# Patient Record
Sex: Female | Born: 1940 | Race: White | Hispanic: No | Smoking: Former smoker
Health system: Southern US, Community
[De-identification: ages and names within clinical notes are randomized; demographics above are authoritative.]

## PROBLEM LIST (undated history)

## (undated) DIAGNOSIS — J449 Chronic obstructive pulmonary disease, unspecified: Secondary | ICD-10-CM

## (undated) DIAGNOSIS — I10 Essential (primary) hypertension: Secondary | ICD-10-CM

## (undated) DIAGNOSIS — J45909 Unspecified asthma, uncomplicated: Secondary | ICD-10-CM

## (undated) HISTORY — PX: COLON SURGERY: SHX602

## (undated) HISTORY — PX: ABDOMINAL HYSTERECTOMY: SHX81

---

## 2017-06-13 ENCOUNTER — Emergency Department: Payer: Medicare HMO

## 2017-06-13 ENCOUNTER — Inpatient Hospital Stay
Admission: EM | Admit: 2017-06-13 | Discharge: 2017-06-17 | DRG: 190 | Disposition: A | Discharge status: Home Health | Payer: Medicare HMO | Attending: Internal Medicine | Admitting: Internal Medicine

## 2017-06-13 ENCOUNTER — Encounter: Payer: Self-pay | Admitting: Emergency Medicine

## 2017-06-13 ENCOUNTER — Other Ambulatory Visit: Payer: Self-pay

## 2017-06-13 DIAGNOSIS — E871 Hypo-osmolality and hyponatremia: Secondary | ICD-10-CM | POA: Diagnosis present

## 2017-06-13 DIAGNOSIS — F17210 Nicotine dependence, cigarettes, uncomplicated: Secondary | ICD-10-CM | POA: Diagnosis present

## 2017-06-13 DIAGNOSIS — J441 Chronic obstructive pulmonary disease with (acute) exacerbation: Secondary | ICD-10-CM | POA: Diagnosis present

## 2017-06-13 DIAGNOSIS — Z885 Allergy status to narcotic agent status: Secondary | ICD-10-CM | POA: Diagnosis not present

## 2017-06-13 DIAGNOSIS — Z7951 Long term (current) use of inhaled steroids: Secondary | ICD-10-CM

## 2017-06-13 DIAGNOSIS — R0902 Hypoxemia: Secondary | ICD-10-CM

## 2017-06-13 DIAGNOSIS — I10 Essential (primary) hypertension: Secondary | ICD-10-CM | POA: Diagnosis present

## 2017-06-13 DIAGNOSIS — Z886 Allergy status to analgesic agent status: Secondary | ICD-10-CM

## 2017-06-13 DIAGNOSIS — Z90710 Acquired absence of both cervix and uterus: Secondary | ICD-10-CM

## 2017-06-13 DIAGNOSIS — Z9981 Dependence on supplemental oxygen: Secondary | ICD-10-CM

## 2017-06-13 DIAGNOSIS — F419 Anxiety disorder, unspecified: Secondary | ICD-10-CM | POA: Diagnosis present

## 2017-06-13 DIAGNOSIS — Z79899 Other long term (current) drug therapy: Secondary | ICD-10-CM

## 2017-06-13 DIAGNOSIS — J9621 Acute and chronic respiratory failure with hypoxia: Secondary | ICD-10-CM | POA: Diagnosis present

## 2017-06-13 DIAGNOSIS — R0602 Shortness of breath: Secondary | ICD-10-CM | POA: Diagnosis present

## 2017-06-13 HISTORY — DX: Essential (primary) hypertension: I10

## 2017-06-13 HISTORY — DX: Unspecified asthma, uncomplicated: J45.909

## 2017-06-13 HISTORY — DX: Chronic obstructive pulmonary disease, unspecified: J44.9

## 2017-06-13 LAB — MAGNESIUM: Magnesium: 1.9 mg/dL (ref 1.7–2.4)

## 2017-06-13 LAB — CBC WITH DIFFERENTIAL/PLATELET
BASOS ABS: 0.1 10*3/uL (ref 0–0.1)
BASOS PCT: 1 %
EOS ABS: 0.2 10*3/uL (ref 0–0.7)
EOS PCT: 3 %
HCT: 46 % (ref 35.0–47.0)
Hemoglobin: 15.3 g/dL (ref 12.0–16.0)
LYMPHS PCT: 23 %
Lymphs Abs: 1.7 10*3/uL (ref 1.0–3.6)
MCH: 31.3 pg (ref 26.0–34.0)
MCHC: 33.3 g/dL (ref 32.0–36.0)
MCV: 94 fL (ref 80.0–100.0)
Monocytes Absolute: 0.8 10*3/uL (ref 0.2–0.9)
Monocytes Relative: 10 %
Neutro Abs: 4.7 10*3/uL (ref 1.4–6.5)
Neutrophils Relative %: 63 %
PLATELETS: 319 10*3/uL (ref 150–440)
RBC: 4.89 MIL/uL (ref 3.80–5.20)
RDW: 13.1 % (ref 11.5–14.5)
WBC: 7.5 10*3/uL (ref 3.6–11.0)

## 2017-06-13 LAB — BRAIN NATRIURETIC PEPTIDE: B NATRIURETIC PEPTIDE 5: 13 pg/mL (ref 0.0–100.0)

## 2017-06-13 LAB — TROPONIN I: Troponin I: <0.03 ng/mL (ref <0.03)

## 2017-06-13 LAB — BASIC METABOLIC PANEL
ANION GAP: 13 (ref 5–15)
BUN: 21 mg/dL — ABNORMAL HIGH (ref 6–20)
CALCIUM: 9.4 mg/dL (ref 8.9–10.3)
CO2: 37 mmol/L — ABNORMAL HIGH (ref 22–32)
Chloride: 84 mmol/L — ABNORMAL LOW (ref 101–111)
Creatinine, Ser: 0.8 mg/dL (ref 0.44–1.00)
GFR calc Af Amer: >60 mL/min (ref >60)
GLUCOSE: 104 mg/dL — AB (ref 65–99)
Potassium: 4.1 mmol/L (ref 3.5–5.1)
SODIUM: 134 mmol/L — AB (ref 135–145)

## 2017-06-13 MED ORDER — ENALAPRIL MALEATE 10 MG PO TABS: 10.0000 mg | ORAL_TABLET | Freq: Every day | ORAL | Status: DC

## 2017-06-13 MED ORDER — BISACODYL 5 MG PO TBEC: 5.0000 mg | DELAYED_RELEASE_TABLET | Freq: Every day | ORAL | Status: DC | PRN

## 2017-06-13 MED ORDER — ACETAMINOPHEN 325 MG PO TABS
650.0000 mg | ORAL_TABLET | Freq: Four times a day (QID) | ORAL | Status: DC | PRN
  Administered 2017-06-13 – 2017-06-17 (×9): 650 mg via ORAL
  Filled 2017-06-13 (×9): qty 2

## 2017-06-13 MED ORDER — HYDROCHLOROTHIAZIDE 25 MG PO TABS: 25.0000 mg | ORAL_TABLET | Freq: Every day | ORAL | Status: DC

## 2017-06-13 MED ORDER — ONDANSETRON HCL 4 MG/2ML IJ SOLN: 4.0000 mg | Freq: Four times a day (QID) | INTRAMUSCULAR | Status: DC | PRN

## 2017-06-13 MED ORDER — IPRATROPIUM-ALBUTEROL 0.5-2.5 (3) MG/3ML IN SOLN
3.0000 mL | Freq: Once | RESPIRATORY_TRACT | Status: AC
  Administered 2017-06-13: 3 mL via RESPIRATORY_TRACT
  Filled 2017-06-13: qty 3

## 2017-06-13 MED ORDER — ONDANSETRON HCL 4 MG PO TABS
4.0000 mg | ORAL_TABLET | Freq: Four times a day (QID) | ORAL | Status: DC | PRN
  Administered 2017-06-16: 4 mg via ORAL
  Filled 2017-06-13: qty 1

## 2017-06-13 MED ORDER — ALBUTEROL SULFATE (2.5 MG/3ML) 0.083% IN NEBU: 2.5000 mg | INHALATION_SOLUTION | RESPIRATORY_TRACT | Status: DC | PRN

## 2017-06-13 MED ORDER — LORAZEPAM 2 MG/ML IJ SOLN
0.5000 mg | Freq: Once | INTRAMUSCULAR | Status: AC
  Administered 2017-06-13: 0.5 mg via INTRAVENOUS

## 2017-06-13 MED ORDER — SODIUM CHLORIDE 0.9% FLUSH
3.0000 mL | INTRAVENOUS | Status: DC | PRN
Start: 2017-06-13 — End: 2017-06-17

## 2017-06-13 MED ORDER — HYDROCHLOROTHIAZIDE 25 MG PO TABS
25.0000 mg | ORAL_TABLET | Freq: Every day | ORAL | Status: DC
  Administered 2017-06-14 – 2017-06-17 (×4): 25 mg via ORAL
  Filled 2017-06-13 (×4): qty 1

## 2017-06-13 MED ORDER — ENALAPRIL-HYDROCHLOROTHIAZIDE 10-25 MG PO TABS: 1.0000 | ORAL_TABLET | Freq: Every day | ORAL | Status: DC

## 2017-06-13 MED ORDER — METHYLPREDNISOLONE SODIUM SUCC 125 MG IJ SOLR
125.0000 mg | Freq: Once | INTRAMUSCULAR | Status: AC
  Administered 2017-06-13: 125 mg via INTRAVENOUS
  Filled 2017-06-13: qty 2

## 2017-06-13 MED ORDER — ALBUTEROL SULFATE (2.5 MG/3ML) 0.083% IN NEBU
5.0000 mg | INHALATION_SOLUTION | Freq: Once | RESPIRATORY_TRACT | Status: AC
  Administered 2017-06-13: 5 mg via RESPIRATORY_TRACT
  Filled 2017-06-13: qty 6

## 2017-06-13 MED ORDER — DEXTROSE 5 % IV SOLN
500.0000 mg | Freq: Once | INTRAVENOUS | Status: AC
  Administered 2017-06-13: 500 mg via INTRAVENOUS
  Filled 2017-06-13: qty 500

## 2017-06-13 MED ORDER — ALPRAZOLAM 0.5 MG PO TABS
0.5000 mg | ORAL_TABLET | Freq: Every day | ORAL | Status: DC
  Administered 2017-06-13 – 2017-06-15 (×3): 0.5 mg via ORAL
  Filled 2017-06-13 (×3): qty 1

## 2017-06-13 MED ORDER — ENALAPRIL MALEATE 5 MG PO TABS
5.0000 mg | ORAL_TABLET | Freq: Every day | ORAL | Status: DC
  Administered 2017-06-14 – 2017-06-17 (×4): 5 mg via ORAL
  Filled 2017-06-13 (×4): qty 1

## 2017-06-13 MED ORDER — SENNOSIDES-DOCUSATE SODIUM 8.6-50 MG PO TABS: 1.0000 | ORAL_TABLET | Freq: Every evening | ORAL | Status: DC | PRN

## 2017-06-13 MED ORDER — ENOXAPARIN SODIUM 40 MG/0.4ML ~~LOC~~ SOLN
40.0000 mg | SUBCUTANEOUS | Status: DC
  Administered 2017-06-13 – 2017-06-16 (×4): 40 mg via SUBCUTANEOUS
  Filled 2017-06-13 (×4): qty 0.4

## 2017-06-13 MED ORDER — LORAZEPAM 2 MG/ML IJ SOLN
INTRAMUSCULAR | Status: AC
  Administered 2017-06-13: 0.5 mg via INTRAVENOUS
  Filled 2017-06-13: qty 1

## 2017-06-13 MED ORDER — ORAL CARE MOUTH RINSE
15.0000 mL | Freq: Two times a day (BID) | OROMUCOSAL | Status: DC
  Administered 2017-06-13 – 2017-06-17 (×8): 15 mL via OROMUCOSAL

## 2017-06-13 MED ORDER — IPRATROPIUM-ALBUTEROL 0.5-2.5 (3) MG/3ML IN SOLN
3.0000 mL | Freq: Four times a day (QID) | RESPIRATORY_TRACT | Status: DC
  Administered 2017-06-13 – 2017-06-17 (×12): 3 mL via RESPIRATORY_TRACT
  Filled 2017-06-13 (×12): qty 3

## 2017-06-13 MED ORDER — SODIUM CHLORIDE 0.9 % IV SOLN: 250.0000 mL | INTRAVENOUS | Status: DC | PRN

## 2017-06-13 MED ORDER — GUAIFENESIN 100 MG/5ML PO SOLN
5.0000 mL | ORAL | Status: DC | PRN
  Filled 2017-06-13: qty 5

## 2017-06-13 MED ORDER — METHYLPREDNISOLONE SODIUM SUCC 125 MG IJ SOLR
60.0000 mg | Freq: Four times a day (QID) | INTRAMUSCULAR | Status: DC
Start: 2017-06-13 — End: 2017-06-15
  Administered 2017-06-13 – 2017-06-15 (×7): 60 mg via INTRAVENOUS
  Filled 2017-06-13 (×7): qty 2

## 2017-06-13 MED ORDER — SODIUM CHLORIDE 0.9% FLUSH
3.0000 mL | Freq: Two times a day (BID) | INTRAVENOUS | Status: DC
  Administered 2017-06-13 – 2017-06-16 (×7): 3 mL via INTRAVENOUS

## 2017-06-13 MED ORDER — MOMETASONE FURO-FORMOTEROL FUM 100-5 MCG/ACT IN AERO
2.0000 | INHALATION_SPRAY | Freq: Two times a day (BID) | RESPIRATORY_TRACT | Status: DC
  Administered 2017-06-13 – 2017-06-14 (×2): 2 via RESPIRATORY_TRACT
  Filled 2017-06-13: qty 8.8

## 2017-06-13 NOTE — ED Triage Notes (Signed)
Patient from home via [facility]. Reports increased SOB x 2 days. History of asthma and COPD. EMS reports oxygen saturation of 68% on baseline 2 L Patient given one albuterol and one duoneb treatment by EMS. Oxygen saturation upon arrival to ED 86% on 2L. Oxygen increased to 4L Index. Patient able to speak in complete sentences but does not toleration laying back. Alert and oriented x4.

## 2017-06-13 NOTE — ED Provider Notes (Signed)
[facility] Emergency Department Provider Note  ____________________________________________   First MD Initiated Contact with Patient 06/13/17 1505     (approximate)  I have reviewed the triage vital signs and the nursing notes.   HISTORY  Chief Complaint Shortness of Breath   HPI Candace Butler is a 76 y.o. female with a history of COPD on 2 L of nasal cannula oxygen who is presenting with worsening shortness of breath over the past week.  The patient says that she feels like she has to cough but has not been.  She does not have any breathing treatments at home but does take an inhaled steroid.  She says that she has not seen a pulmonologist in 5 years.  Still smokes but only very infrequently.  Says that she has had a half a cigarette in the past week.  She was placed on 4 L of nasal cannula oxygen which resolved her pressures up to the 80s.  Patient denying any pain at this time.  He denies any fevers.   Past Medical History:  Diagnosis Date  . Asthma   . COPD (chronic obstructive pulmonary disease) (HCC)   . Hypertension     Patient Active Problem List   Diagnosis Date Noted  . COPD exacerbation (HCC) 06/13/2017    Past Surgical History:  Procedure Laterality Date  . ABDOMINAL HYSTERECTOMY    . COLON SURGERY      Prior to Admission medications   Not on File    Allergies Indomethacin and Vicodin [hydrocodone-acetaminophen]  No family history on file.  Social History Social History   Tobacco Use  . Smoking status: Current Every Day Smoker    Types: Cigarettes  . Smokeless tobacco: Never Used  Substance Use Topics  . Alcohol use: No    Frequency: Never  . Drug use: No    Review of Systems  Constitutional: No fever/chills Eyes: No visual changes. ENT: No sore throat. Cardiovascular: Denies chest pain. 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  No nausea, no vomiting.   No diarrhea.  No constipation. Genitourinary: Negative for dysuria. Musculoskeletal: Negative for back pain. Skin: Negative for rash. Neurological: Negative for headaches, focal weakness or numbness.   ____________________________________________   PHYSICAL EXAM:  VITAL SIGNS: ED Triage Vitals  Enc Vitals Group     BP 06/13/17 1437 (!) 150/88     Pulse Rate 06/13/17 1437 (!) 105     Resp 06/13/17 1437 (!) 31     Temp 06/13/17 1437 98.4 F (36.9 C)     Temp Source 06/13/17 1437 Oral     SpO2 06/13/17 1437 (!) 84 %     Weight 06/13/17 1438 166 lb 0.1 oz (75.3 kg)     Height 06/13/17 1438 5\' 1"  (1.549 m)     Head Circumference --      Peak Flow --      Pain Score --      Pain Loc --      Pain Edu? --      Excl. in GC? --     Constitutional: Alert and oriented. Well appearing and in no acute distress. Eyes: Conjunctivae are normal.  Head: Atraumatic. Nose: No congestion/rhinnorhea. Mouth/Throat: Mucous membranes are moist.  Neck: No stridor.   Cardiovascular: Tachycardic, regular rhythm. Grossly normal heart sounds.   Respiratory: Labored respirations with tachypnea.  Speaking in full sentences.  Prolonged expiratory phase with wheezing throughout all fields and decreased air movement.  Gastrointestinal: Soft and nontender. No distention. Musculoskeletal: No lower extremity tenderness nor edema.  No joint effusions. Neurologic:  Normal speech and language. No gross focal neurologic deficits are appreciated. Skin:  Skin is warm, dry and intact. No rash noted. Psychiatric: Mood and affect are normal. Speech and behavior are normal.  ____________________________________________   LABS (all labs ordered are listed, but only abnormal results are displayed)  Labs Reviewed  BASIC METABOLIC PANEL - Abnormal; Notable for the following components:      Result Value   Sodium 134 (*)    Chloride 84 (*)    CO2 37 (*)    Glucose, Bld 104 (*)    BUN 21 (*)    All other  components within normal limits  CBC WITH DIFFERENTIAL/PLATELET  BRAIN NATRIURETIC PEPTIDE  TROPONIN I   ____________________________________________  EKG  ED ECG REPORT I, Arelia LongestSchaevitz,  David M, the attending physician, personally viewed and interpreted this ECG.   Date: 06/13/2017  EKG Time: 1438  Rate: 105  Rhythm: sinus tachycardia  Axis: right  Intervals:none  ST&T Change: No ST segment elevation or depression.  No abnormal T wave inversion.  ____________________________________________  RADIOLOGY  Hyperexpansion without acute findings. ____________________________________________   PROCEDURES  Procedure(s) performed:   Procedures  Critical Care performed:  CRITICAL CARE Performed by: Arelia LongestSchaevitz,  David M   Total critical care time: 35 minutes  Critical care time was exclusive of separately billable procedures and treating other patients.  Critical care was necessary to treat or prevent imminent or life-threatening deterioration.  Critical care was time spent personally by me on the following activities: development of treatment plan with patient and/or surrogate as well as nursing, discussions with consultants, evaluation of patient's response to treatment, examination of patient, obtaining history from patient or surrogate, ordering and performing treatments and interventions, ordering and review of laboratory studies, ordering and review of radiographic studies, pulse oximetry and re-evaluation of patient's condition.  ____________________________________________   INITIAL IMPRESSION / ASSESSMENT AND PLAN / ED COURSE  Pertinent labs & imaging results that were available during my care of the patient were reviewed by me and considered in my medical decision making (see chart for details).  Differential includes, but is not limited to, viral syndrome, bronchitis including COPD exacerbation, pneumonia, reactive airway disease including asthma, CHF including  exacerbation with or without pulmonary/interstitial edema, pneumothorax, ACS, thoracic trauma, and pulmonary embolism.  As part of my medical decision making, I reviewed the following data within the electronic MEDICAL RECORD NUMBERNo previous notes on record for review.  ----------------------------------------- 3:35 PM on 06/13/2017 -----------------------------------------  Patient at this time has had 3 neb treatments as well as steroids.  Still with wheezing and labored respirations.  Will be admitted to the hospital.  Patient and daughter aware of the plan and willing to comply.  Signed out to Dr. Imogene Burnhen.       ____________________________________________   FINAL CLINICAL IMPRESSION(S) / ED DIAGNOSES  COPD exacerbation.    NEW MEDICATIONS STARTED DURING THIS VISIT:  This SmartLink is deprecated. Use AVSMEDLIST instead to display the medication list for a patient.   Note:  This document was prepared using Dragon voice recognition software and may include unintentional dictation errors.     Myrna BlazerSchaevitz, David Matthew, MD 06/13/17 1537

## 2017-06-13 NOTE — H&P (Signed)
[facility] - [place] at [facility]   PATIENT NAME: Candace GibbonGlenda Butler    MR#:  [id number]  DATE OF BIRTH:  1941/06/21  DATE OF ADMISSION:  06/13/2017  PRIMARY CARE PHYSICIAN: Leotis ShamesSingh, Jasmine, MD   REQUESTING/REFERRING PHYSICIAN: Myrna BlazerSchaevitz, David Matthew, MD  CHIEF COMPLAINT:   Chief Complaint  Patient presents with  . Shortness of Breath   Worsening shortness of breath is, wheezing and cough today. HISTORY OF PRESENT ILLNESS:  Candace GibbonGlenda Butler  is a 76 y.o. female with a known history of COPD, chronic respiratory failure on home oxygen 2 L, and hypertension.  The patient presents to the ED with above chief complaints.  She denies any fever or chills, no chest pain but chest tightness, no palpitations no leg swelling.  Chest x-ray did not show any infiltrate.  She is found hypoxia and put on oxygen 4 L in the ED.  She is treated with nebulizer and Zithromax in the ED.  PAST MEDICAL HISTORY:   Past Medical History:  Diagnosis Date  . Asthma   . COPD (chronic obstructive pulmonary disease) (HCC)   . Hypertension     PAST SURGICAL HISTORY:   Past Surgical History:  Procedure Laterality Date  . ABDOMINAL HYSTERECTOMY    . COLON SURGERY      SOCIAL HISTORY:   Social History   Tobacco Use  . Smoking status: Current Every Day Smoker    Types: Cigarettes  . Smokeless tobacco: Never Used  Substance Use Topics  . Alcohol use: No    Frequency: Never    FAMILY HISTORY:  No family history on file.  DRUG ALLERGIES:   Allergies  Allergen Reactions  . Indomethacin   . Vicodin [Hydrocodone-Acetaminophen]     REVIEW OF SYSTEMS:   Review of Systems  Constitutional: Positive for malaise/fatigue. Negative for chills and fever.  HENT: Negative for sore throat.   Eyes: Negative for blurred vision and double vision.  Respiratory: Positive for cough, shortness of breath and wheezing. Negative for hemoptysis, sputum production and stridor.   Cardiovascular:  Negative for chest pain, palpitations, orthopnea and leg swelling.  Gastrointestinal: Negative for abdominal pain, blood in stool, diarrhea, melena, nausea and vomiting.  Genitourinary: Negative for dysuria, flank pain and hematuria.  Musculoskeletal: Negative for back pain and joint pain.  Skin: Negative for rash.  Neurological: Positive for weakness. Negative for dizziness, sensory change, focal weakness, seizures, loss of consciousness and headaches.  Endo/Heme/Allergies: Negative for polydipsia.  Psychiatric/Behavioral: Negative for depression. The patient is not nervous/anxious.     MEDICATIONS AT HOME:   Prior to Admission medications   Medication Sig Start Date End Date Taking? Authorizing Provider  ADVAIR DISKUS 250-50 MCG/DOSE AEPB Take 1 puff 2 (two) times daily by mouth. 05/28/17  Yes [provider]  ALPRAZolam Prudy Feeler(XANAX) 0.5 MG tablet Take 1 tablet daily by mouth. 05/23/17  Yes [provider]  enalapril-hydrochlorothiazide (VASERETIC) 10-25 MG tablet Take 1 tablet daily by mouth. 04/12/17  Yes [provider]      VITAL SIGNS:  Blood pressure (!) 141/63, pulse (!) 108, temperature 98.4 F (36.9 C), temperature source Oral, resp. rate (!) 24, height 5\' 1"  (1.549 m), weight 166 lb 0.1 oz (75.3 kg), SpO2 99 %.  PHYSICAL EXAMINATION:  Physical Exam  GENERAL:  76 y.o.-year-old patient lying in the bed with no acute distress.  EYES: Pupils equal, round, reactive to light and accommodation. No scleral icterus. Extraocular muscles intact.  HEENT: Head atraumatic, normocephalic. Oropharynx and  nasopharynx clear.  NECK:  Supple, no jugular venous distention. No thyroid enlargement, no tenderness.  LUNGS: Diminshed breath sounds bilaterally, expiratory wheezing, no rales,rhonchi or crepitation. No use of accessory muscles of respiration.  CARDIOVASCULAR: S1, S2 normal. No murmurs, rubs, or gallops.  ABDOMEN: Soft, nontender, nondistended. Bowel sounds  present. No organomegaly or mass.  EXTREMITIES: No pedal edema, cyanosis, or clubbing.  NEUROLOGIC: Cranial nerves II through XII are intact. Muscle strength 5/5 in all extremities. Sensation intact. Gait not checked.  PSYCHIATRIC: The patient is alert and oriented x 3.  SKIN: No obvious rash, lesion, or ulcer.   LABORATORY PANEL:   CBC Recent Labs  Lab 06/13/17 1442  WBC 7.5  HGB 15.3  HCT 46.0  PLT 319   ------------------------------------------------------------------------------------------------------------------  Chemistries  Recent Labs  Lab 06/13/17 1442  NA 134*  K 4.1  CL 84*  CO2 37*  GLUCOSE 104*  BUN 21*  CREATININE 0.80  CALCIUM 9.4   ------------------------------------------------------------------------------------------------------------------  Cardiac Enzymes Recent Labs  Lab 06/13/17 1442  TROPONINI <0.03   ------------------------------------------------------------------------------------------------------------------  RADIOLOGY:  Dg Chest Port 1 View  Result Date: 06/13/2017 CLINICAL DATA:  Shortness of breath for 2 days. EXAM: PORTABLE CHEST 1 VIEW COMPARISON:  None. FINDINGS: 1450 hours. Lungs are hyperexpanded. The lungs are clear without focal pneumonia, edema, pneumothorax or pleural effusion. Interstitial markings are diffusely coarsened with chronic features. Cardiopericardial silhouette is at upper limits of normal for size. The visualized bony structures of the thorax are intact. Telemetry leads overlie the chest. IMPRESSION: Hyperexpansion without acute cardiopulmonary findings. Electronically Signed   By: Kennith Center M.D.   On: 06/13/2017 15:01      IMPRESSION AND PLAN:    Acute on chronic respiratory failure with hypoxia due to COPD exacerbation. The patient will be admitted to medical floor. Duoneb Q6h, iv solumedrol, dulera.  Robitussin as needed.  HTN. Continue enalipril-HCTZ.  Hyponatremia. F/u BMP.  Tobacco  abuse. Smoking cessation was counseled for 4 minutes.  All the records are reviewed and case discussed with ED provider. Management plans discussed with the patient, her daughter  and they are in agreement.  CODE STATUS: Full code  TOTAL TIME TAKING CARE OF THIS PATIENT: 55 minutes.    Shaune Pollack M.D on 06/13/2017 at 5:23 PM  Between 7am to 6pm - Pager - [phone]  After 6pm go to [email or web address] - Social research officer, government  [facility] [place] [facility]  Office  [phone]  CC: Primary care physician; Leotis Shames, MD   Note: This dictation was prepared with Dragon dictation along with smaller phrase technology. Any transcriptional errors that result from this process are unin

## 2017-06-13 NOTE — ED Notes (Signed)
Hooked patient up to monitor. 

## 2017-06-14 MED ORDER — MORPHINE SULFATE (CONCENTRATE) 10 MG/0.5ML PO SOLN: 10.0000 mg | ORAL | Status: DC | PRN

## 2017-06-14 MED ORDER — TRAMADOL HCL 50 MG PO TABS
50.0000 mg | ORAL_TABLET | Freq: Four times a day (QID) | ORAL | Status: DC | PRN
Start: 2017-06-14 — End: 2017-06-17
  Administered 2017-06-15: 50 mg via ORAL
  Filled 2017-06-14: qty 1

## 2017-06-14 MED ORDER — BUDESONIDE 0.5 MG/2ML IN SUSP
0.5000 mg | Freq: Two times a day (BID) | RESPIRATORY_TRACT | Status: DC
  Administered 2017-06-14 – 2017-06-17 (×6): 0.5 mg via RESPIRATORY_TRACT
  Filled 2017-06-14 (×6): qty 2

## 2017-06-14 MED ORDER — ALPRAZOLAM 0.5 MG PO TABS
0.5000 mg | ORAL_TABLET | Freq: Once | ORAL | Status: AC
  Administered 2017-06-14: 0.5 mg via ORAL
  Filled 2017-06-14: qty 1

## 2017-06-14 MED ORDER — MORPHINE SULFATE (CONCENTRATE) 10 MG/0.5ML PO SOLN
10.0000 mg | ORAL | Status: DC | PRN
  Administered 2017-06-14: 10 mg via ORAL
  Filled 2017-06-14: qty 1

## 2017-06-14 NOTE — Progress Notes (Signed)
[facility] - [place] at [facility]   PATIENT NAME: Candace Butler    MR#:  [id number]  DATE OF BIRTH:  06/08/1941  SUBJECTIVE:   Patient here due to shortness of breath secondary to COPD exacerbation. Still having significant shortness of breath on minimal exertion. Patient's daughters at bedside.  REVIEW OF SYSTEMS:    Review of Systems  Constitutional: Negative for chills and fever.  HENT: Negative for congestion and tinnitus.   Eyes: Negative for blurred vision and double vision.  Respiratory: Positive for shortness of breath. Negative for cough and wheezing.   Cardiovascular: Negative for chest pain, orthopnea and PND.  Gastrointestinal: Negative for abdominal pain, diarrhea, nausea and vomiting.  Genitourinary: Negative for dysuria and hematuria.  Neurological: Negative for dizziness, sensory change and focal weakness.  All other systems reviewed and are negative.   Nutrition: Heart Healthy Tolerating Diet: yes Tolerating PT: Ambulatory  DRUG ALLERGIES:   Allergies  Allergen Reactions  . Indomethacin   . Vicodin [Hydrocodone-Acetaminophen]     VITALS:  Blood pressure (!) 95/46, pulse 64, temperature 98.5 F (36.9 C), temperature source Oral, resp. rate 20, height 5\' 1"  (1.549 m), weight 75.3 kg (166 lb 0.1 oz), SpO2 92 %.  PHYSICAL EXAMINATION:   Physical Exam  GENERAL:  76 y.o.-year-old patient lying in bed in mild Resp. Distress.   EYES: Pupils equal, round, reactive to light and accommodation. No scleral icterus. Extraocular muscles intact.  HEENT: Head atraumatic, normocephalic. Oropharynx and nasopharynx clear.  NECK:  Supple, no jugular venous distention. No thyroid enlargement, no tenderness.  LUNGS: Prolonged inspiratory and expiratory phase, no wheezing, rales, rhonchi. No use of accessory muscles of respiration.  CARDIOVASCULAR: S1, S2 normal. No murmurs, rubs, or gallops.  ABDOMEN: Soft, nontender, nondistended. Bowel sounds present.  No organomegaly or mass.  EXTREMITIES: No cyanosis, clubbing or edema b/l.    NEUROLOGIC: Cranial nerves II through XII are intact. No focal Motor or sensory deficits b/l.   PSYCHIATRIC: The patient is alert and oriented x 3.  SKIN: No obvious rash, lesion, or ulcer.    LABORATORY PANEL:   CBC Recent Labs  Lab 06/13/17 1442  WBC 7.5  HGB 15.3  HCT 46.0  PLT 319   ------------------------------------------------------------------------------------------------------------------  Chemistries  Recent Labs  Lab 06/13/17 1442  NA 134*  K 4.1  CL 84*  CO2 37*  GLUCOSE 104*  BUN 21*  CREATININE 0.80  CALCIUM 9.4  MG 1.9   ------------------------------------------------------------------------------------------------------------------  Cardiac Enzymes Recent Labs  Lab 06/13/17 1442  TROPONINI <0.03   ------------------------------------------------------------------------------------------------------------------  RADIOLOGY:  Dg Chest Port 1 View  Result Date: 06/13/2017 CLINICAL DATA:  Shortness of breath for 2 days. EXAM: PORTABLE CHEST 1 VIEW COMPARISON:  None. FINDINGS: 1450 hours. Lungs are hyperexpanded. The lungs are clear without focal pneumonia, edema, pneumothorax or pleural effusion. Interstitial markings are diffusely coarsened with chronic features. Cardiopericardial silhouette is at upper limits of normal for size. The visualized bony structures of the thorax are intact. Telemetry leads overlie the chest. IMPRESSION: Hyperexpansion without acute cardiopulmonary findings. Electronically Signed   By: Kennith Center M.D.   On: 06/13/2017 15:01     ASSESSMENT AND PLAN:   76 year old female with past medical history of COPD with ongoing tobacco abuse, anxiety, essential hypertension presented to the hospital due to shortness of breath.  1. Acute on chronic respiratory failure with hypoxia-secondary to COPD exacerbation. -Continue O2 supplementation, continue IV  steroids, will place on Pulmicort nebs, continue duo nebs. Wean off  oxygen as tolerated  2. COPD exacerbation-secondary to ongoing tobacco abuse. Slow to improve. Still has significant shortness of breath on minimal exertion. -Continue IV steroids, scheduled DuoNeb's. Will add Pulmicort nebs. -We'll add some Roxanol for air hunger.  3. Essential hypertension-continue enalapril/HCTZ.  4. Anxiety-continue Xanax.   All the records are reviewed and case discussed with Care Management/Social Worker. Management plans discussed with the patient, family and they are in agreement.  CODE STATUS: Full code  DVT Prophylaxis: Lovenox  TOTAL TIME TAKING CARE OF THIS PATIENT: 30 minutes.   POSSIBLE D/C IN 1-2 DAYS, DEPENDING ON CLINICAL CONDITION.   Houston SirenSAINANI,Bard Haupert J M.D on 06/14/2017 at 2:23 PM  Between 7am to 6pm - Pager - [phone]  After 6pm go to [email or web address] - Social research officer, governmentpassword EPAS [email or web address]  [facility] [place] [facility]  Office  [phone]  CC: Primary care physician; Leotis ShamesSingh, Jasmine, MD

## 2017-06-15 MED ORDER — ALPRAZOLAM 0.5 MG PO TABS
0.5000 mg | ORAL_TABLET | Freq: Two times a day (BID) | ORAL | Status: DC | PRN
  Administered 2017-06-15 – 2017-06-17 (×3): 0.5 mg via ORAL
  Filled 2017-06-15 (×3): qty 1

## 2017-06-15 MED ORDER — PREDNISONE 10 MG PO TABS: ORAL_TABLET | ORAL | 0 refills | Status: DC

## 2017-06-15 MED ORDER — PREDNISONE 20 MG PO TABS
40.0000 mg | ORAL_TABLET | Freq: Every day | ORAL | Status: DC
  Administered 2017-06-16 – 2017-06-17 (×2): 40 mg via ORAL
  Filled 2017-06-15 (×2): qty 2

## 2017-06-15 MED ORDER — ALUM & MAG HYDROXIDE-SIMETH 200-200-20 MG/5ML PO SUSP
30.0000 mL | ORAL | Status: DC | PRN
  Filled 2017-06-15: qty 30

## 2017-06-15 MED ORDER — GUAIFENESIN 100 MG/5ML PO SOLN: 5.0000 mL | ORAL | 0 refills | Status: DC | PRN

## 2017-06-15 NOTE — Progress Notes (Signed)
[facility] - [place] at [facility]   PATIENT NAME: Candace GibbonGlenda Butler    MR#:  [id number]  DATE OF BIRTH:  10/16/1940  SUBJECTIVE:   Patient here due to shortness of breath secondary to COPD exacerbation.  Better shortness of breath, anxious. On 4L O2 [place].  She still has hypoxia when the oxygen is weaned down to 2-3 L.  REVIEW OF SYSTEMS:    Review of Systems  Constitutional: Negative for chills and fever.  HENT: Negative for congestion and tinnitus.   Eyes: Negative for blurred vision and double vision.  Respiratory: Positive for cough and shortness of breath. Negative for wheezing.   Cardiovascular: Negative for chest pain, orthopnea and PND.  Gastrointestinal: Negative for abdominal pain, diarrhea, nausea and vomiting.  Genitourinary: Negative for dysuria and hematuria.  Musculoskeletal: Negative for joint pain.  Skin: Negative for rash.  Neurological: Negative for dizziness, sensory change and focal weakness.  Psychiatric/Behavioral: Negative for depression. The patient is nervous/anxious.   All other systems reviewed and are negative.   Nutrition: Heart Healthy Tolerating Diet: yes Tolerating PT: Ambulatory  DRUG ALLERGIES:   Allergies  Allergen Reactions  . Indomethacin   . Vicodin [Hydrocodone-Acetaminophen]     VITALS:  Blood pressure 129/69, pulse 85, temperature 98.4 F (36.9 C), temperature source Oral, resp. rate 18, height 5\' 1"  (1.549 m), weight 166 lb 0.1 oz (75.3 kg), SpO2 93 %.  PHYSICAL EXAMINATION:   Physical Exam  GENERAL:  76 y.o.-year-old patient lying in bed in mild Resp. Distress.   EYES: Pupils equal, round, reactive to light and accommodation. No scleral icterus. Extraocular muscles intact.  HEENT: Head atraumatic, normocephalic. Oropharynx and nasopharynx clear.  NECK:  Supple, no jugular venous distention. No thyroid enlargement, no tenderness.  LUNGS: Prolonged inspiratory and expiratory phase, no wheezing, rales, rhonchi. No  use of accessory muscles of respiration.  CARDIOVASCULAR: S1, S2 normal. No murmurs, rubs, or gallops.  ABDOMEN: Soft, nontender, nondistended. Bowel sounds present. No organomegaly or mass.  EXTREMITIES: No cyanosis, clubbing or edema b/l.    NEUROLOGIC: Cranial nerves II through XII are intact. No focal Motor or sensory deficits b/l.   PSYCHIATRIC: The patient is alert and oriented x 3.  SKIN: No obvious rash, lesion, or ulcer.    LABORATORY PANEL:   CBC Recent Labs  Lab 06/13/17 1442  WBC 7.5  HGB 15.3  HCT 46.0  PLT 319   ------------------------------------------------------------------------------------------------------------------  Chemistries  Recent Labs  Lab 06/13/17 1442  NA 134*  K 4.1  CL 84*  CO2 37*  GLUCOSE 104*  BUN 21*  CREATININE 0.80  CALCIUM 9.4  MG 1.9   ------------------------------------------------------------------------------------------------------------------  Cardiac Enzymes Recent Labs  Lab 06/13/17 1442  TROPONINI <0.03   ------------------------------------------------------------------------------------------------------------------  RADIOLOGY:  No results found.   ASSESSMENT AND PLAN:   76 year old female with past medical history of COPD with ongoing tobacco abuse, anxiety, essential hypertension presented to the hospital due to shortness of breath.  1. Acute on chronic respiratory failure with hypoxia-secondary to COPD exacerbation. -Continue O2 supplementation, discontinue IV steroids, changed to prednisone taper.  Continue Pulmicort nebs, continue duo nebs. Wean down oxygen as tolerated  2. COPD exacerbation-secondary to ongoing tobacco abuse. discontinue IV steroids, changed to prednisone taper.  Continue Pulmicort nebs, continue duo nebs.   3. Essential hypertension-continue enalapril/HCTZ.  4. Anxiety-continue Xanax.  Generalized weakness.  PT evaluation. All the records are reviewed and case discussed with  Care Management/Social Worker. Management plans discussed with the patient, her daughter  and they are in agreement.  CODE STATUS: Full code  DVT Prophylaxis: Lovenox  TOTAL TIME TAKING CARE OF THIS PATIENT: 32 minutes.   POSSIBLE D/C IN 1-2 DAYS, DEPENDING ON CLINICAL CONDITION.   Shaune Pollackhen, Assyria Morreale M.D on 06/15/2017 at 4:22 PM  Between 7am to 6pm - Pager - [phone]  After 6pm go to [email or web address] - Social research officer, governmentpassword EPAS [email or web address]  [facility] [place] [facility]  Office  [phone]  CC: Primary care physician; Leotis ShamesSingh, Jasmine, MD

## 2017-06-15 NOTE — Plan of Care (Signed)
Continue to monitor pts O2 sats and try to wean back to 2L

## 2017-06-15 NOTE — Progress Notes (Signed)
PT Cancellation Note  Patient Details Name: Candace GibbonGlenda Butler MRN: [id number] DOB: 06-14-1941   Cancelled Treatment:     Patient recently tried to get up with nursing to assess O2 sats with ambulation; However, per nursing O2 sats dropped while sitting up and O2 had to be increased to 4L. Nursing states that patient did not tolerate sitting up well and she is following up with MD regarding O2 sats. Patient just got back in bed; will re-attempt PT eval tomorrow when patient is feeling better and more appropriate for assessment. Thank you for this referral.    Trotter,Margaret PT, DPT 06/15/2017, 3:17 PM

## 2017-06-16 MED ORDER — ALUM & MAG HYDROXIDE-SIMETH 200-200-20 MG/5ML PO SUSP: 30.0000 mL | ORAL | 0 refills | Status: DC | PRN

## 2017-06-16 MED ORDER — ALBUTEROL SULFATE HFA 108 (90 BASE) MCG/ACT IN AERS: 2.0000 | INHALATION_SPRAY | Freq: Four times a day (QID) | RESPIRATORY_TRACT | 2 refills | Status: DC | PRN

## 2017-06-16 NOTE — Plan of Care (Signed)
Continuing to monitor patients oxygen saturation. Pt worked with PT earlier today and 02 sats dropped to 76% when pt got up to side of bed. I was called to pts room and we increased oxygen from 3.5L to 4l. After pt was back at rest in bed her sats increased to 90% on 4l. Pt and family do not feel comfortable with her going home at this time. I communicated concerns with Imogene Burnhen and discharge orders were cancelled. Will continue to monitor 02 sats

## 2017-06-16 NOTE — Evaluation (Signed)
Physical Therapy Evaluation Patient Details Name: Candace Butler MRN: [id number] DOB: 09-Nov-1940 Today's Date: 06/16/2017   History of Present Illness  Pt is a 76 y.o. female presenting to hospital with increasing SOB x1 week and admitted to hospital with acute on chronic respiratory failure with hypoxia d/t COPD exacerbation.  PMH includes COPD on 2 L [place] O2 chronic, htn, asthma.  Clinical Impression  Prior to hospital admission, pt was ambulating short distances in home (no AD) and denies any falls in past 6 months.  Pt lives alone but has family that assists pt with many ADL's.  Currently pt is SBA supine to sit and min assist sit to supine.  Pt's O2 sats 90% on 4 L O2 via nasal cannula resting in bed beginning of session; upon sitting on edge of bed pt's O2 sats decreased to 76% (mild to moderate SOB noted) and with pursed lip breathing unable to bring up O2 sats past 82%.  Pt assisted back into bed with HOB elevated and O2 sats decreased back to 76% (with pursed lip breathing unable to bring past 79%).  Nursing notified immediately and came immediately to assess pt.  Pt repositioned in bed for optimal breathing with 2 assist and pt's O2 eventually increased to 92% on 4 L resting in bed.  Overall pt demonstrating generalized weakness but mobility mostly limited d/t pulmonary status at this time.  Pt would benefit from skilled PT to address noted impairments and functional limitations (see below for any additional details).  Upon hospital discharge, anticipate with improved pulmonary status, pt will be able to discharge to home with HHPT and support of family.    Follow Up Recommendations Home health PT;Supervision for mobility/OOB    Equipment Recommendations  Rolling walker with 5" wheels    Recommendations for Other Services       Precautions / Restrictions Precautions Precautions: Fall Precaution Comments: Monitor O2 Restrictions Weight Bearing Restrictions: No      Mobility  Bed  Mobility Overal bed mobility: Needs Assistance Bed Mobility: Supine to Sit;Sit to Supine     Supine to sit: Supervision;HOB elevated Sit to supine: Min assist;HOB elevated   General bed mobility comments: SBA supine to sit with HOB elevated (mild increased effort to perform); sit to supine min assist for LE's and 2 assist to boost pt up in bed  Transfers                 General transfer comment: Deferred d/t pt desaturating to 76% sitting edge of bed and unable to get above 82% with pursed lip breathing.  Ambulation/Gait             General Gait Details: Deferred d/t pt desaturating to 76% sitting edge of bed and unable to get above 82% with pursed lip breathing.  Stairs            Wheelchair Mobility    Modified Rankin (Stroke Patients Only)       Balance Overall balance assessment: Needs assistance Sitting-balance support: No upper extremity supported;Feet supported Sitting balance-Leahy Scale: Good Sitting balance - Comments: sitting reaching within BOS                                     Pertinent Vitals/Pain Pain Assessment: No/denies pain    Home Living Family/patient expects to be discharged to:: Private residence Living Arrangements: Alone Available Help at Discharge: Family Type of Home:  House Home Access: Stairs to enter Entrance Stairs-Rails: Left Entrance Stairs-Number of Steps: 3 Home Layout: One level Home Equipment: Walker - 2 wheels;Shower seat      Prior Function Level of Independence: Needs assistance   Gait / Transfers Assistance Needed: Limited household ambulation recently d/t SOB (averages 20-25 feet).  No AD.  ADL's / Homemaking Assistance Needed: Family assists pt with bathing, dressing, meal prep, cleaning, preparing pt's pillbox.  Comments: Pt denies any falls in past 6 months.     Hand Dominance        Extremity/Trunk Assessment   Upper Extremity Assessment Upper Extremity Assessment:  Generalized weakness    Lower Extremity Assessment Lower Extremity Assessment: Generalized weakness       Communication   Communication: No difficulties  Cognition Arousal/Alertness: Awake/alert Behavior During Therapy: WFL for tasks assessed/performed Overall Cognitive Status: Within Functional Limits for tasks assessed                                        General Comments General comments (skin integrity, edema, etc.): Pt's daughter and granddaughter present during session.  Nursing cleared pt for participation in physical therapy.  Pt agreeable to PT session.    Exercises  Pursed lip breathing with activity.   Assessment/Plan    PT Assessment Patient needs continued PT services  PT Problem List Decreased strength;Decreased activity tolerance;Decreased balance;Decreased mobility;Cardiopulmonary status limiting activity       PT Treatment Interventions DME instruction;Gait training;Stair training;Functional mobility training;Therapeutic activities;Therapeutic exercise;Balance training;Patient/family education    PT Goals (Current goals can be found in the Care Plan section)  Acute Rehab PT Goals Patient Stated Goal: to go home PT Goal Formulation: With patient Time For Goal Achievement: 06/30/17 Potential to Achieve Goals: Fair    Frequency Min 2X/week   Barriers to discharge        Co-evaluation               AM-PAC PT "6 Clicks" Daily Activity  Outcome Measure Difficulty turning over in bed (including adjusting bedclothes, sheets and blankets)?: A Little Difficulty moving from lying on back to sitting on the side of the bed? : A Little Difficulty sitting down on and standing up from a chair with arms (e.g., wheelchair, bedside commode, etc,.)?: Unable Help needed moving to and from a bed to chair (including a wheelchair)?: A Little Help needed walking in hospital room?: A Lot Help needed climbing 3-5 steps with a railing? : A Lot 6  Click Score: 14    End of Session Equipment Utilized During Treatment: Gait belt;Oxygen(4 L via nasal cannula) Activity Tolerance: Other (comment)(Limited d/t O2 desaturation with minimal activity) Patient left: in bed;with call bell/phone within reach;with bed alarm set;with nursing/sitter in room Nurse Communication: Mobility status;Precautions;Other (comment)(Pt's O2 status during session) PT Visit Diagnosis: Other abnormalities of gait and mobility (R26.89);Muscle weakness (generalized) (M62.81)    Time: 4098-1191 PT Time Calculation (min) (ACUTE ONLY): 28 min   Charges:   PT Evaluation $PT Eval Low Complexity: 1 Low PT Treatments $Therapeutic Activity: 8-22 mins   PT G Codes:   PT G-Codes **NOT FOR INPATIENT CLASS** Functional Assessment Tool Used: AM-PAC 6 Clicks Basic Mobility Functional Limitation: Mobility: Walking and moving around Mobility: Walking and Moving Around Current Status (Y7829): At least 40 percent but less than 60 percent impaired, limited or restricted Mobility: Walking and Moving Around Goal Status [phone]): At  least 1 percent but less than 20 percent impaired, limited or restricted    Hendricks Limesmily Masaichi Kracht, PT 06/16/17, 12:15 PM [phone]

## 2017-06-16 NOTE — Progress Notes (Signed)
[facility] - [place] [facility]   PATIENT NAME: Candace Butler    MR#:  [id number]  DATE OF BIRTH:  07-14-1941  SUBJECTIVE:   Patient here due to shortness of breath secondary to COPD exacerbation.  Better shortness of breath, anxious. On 4L O2 [place].  She still has hypoxia at 87% on exertion.  REVIEW OF SYSTEMS:    Review of Systems  Constitutional: Negative for chills and fever.  HENT: Negative for congestion and tinnitus.   Eyes: Negative for blurred vision and double vision.  Respiratory: Positive for cough and shortness of breath. Negative for wheezing.   Cardiovascular: Negative for chest pain, orthopnea and PND.  Gastrointestinal: Negative for abdominal pain, diarrhea, nausea and vomiting.  Genitourinary: Negative for dysuria and hematuria.  Musculoskeletal: Negative for joint pain.  Skin: Negative for rash.  Neurological: Negative for dizziness, sensory change and focal weakness.  Psychiatric/Behavioral: Negative for depression. The patient is nervous/anxious.   All other systems reviewed and are negative.   Nutrition: Heart Healthy Tolerating Diet: yes Tolerating PT: Ambulatory  DRUG ALLERGIES:   Allergies  Allergen Reactions  . Indomethacin   . Vicodin [Hydrocodone-Acetaminophen]     VITALS:  Blood pressure 112/61, pulse 86, temperature 98.5 F (36.9 C), temperature source Oral, resp. rate 16, height 5\' 1"  (1.549 m), weight 166 lb 0.1 oz (75.3 kg), SpO2 92 %.  PHYSICAL EXAMINATION:   Physical Exam  GENERAL:  76 y.o.-year-old patient lying in bed in mild Resp. Distress.   EYES: Pupils equal, round, reactive to light and accommodation. No scleral icterus. Extraocular muscles intact.  HEENT: Head atraumatic, normocephalic. Oropharynx and nasopharynx clear.  NECK:  Supple, no jugular venous distention. No thyroid enlargement, no tenderness.  LUNGS: Prolonged inspiratory and expiratory phase, no wheezing, rales, rhonchi. No use of accessory  muscles of respiration.  CARDIOVASCULAR: S1, S2 normal. No murmurs, rubs, or gallops.  ABDOMEN: Soft, nontender, nondistended. Bowel sounds present. No organomegaly or mass.  EXTREMITIES: No cyanosis, clubbing or edema b/l.    NEUROLOGIC: Cranial nerves II through XII are intact. No focal Motor or sensory deficits b/l.   PSYCHIATRIC: The patient is alert and oriented x 3.  SKIN: No obvious rash, lesion, or ulcer.    LABORATORY PANEL:   CBC Recent Labs  Lab 06/13/17 1442  WBC 7.5  HGB 15.3  HCT 46.0  PLT 319   ------------------------------------------------------------------------------------------------------------------  Chemistries  Recent Labs  Lab 06/13/17 1442  NA 134*  K 4.1  CL 84*  CO2 37*  GLUCOSE 104*  BUN 21*  CREATININE 0.80  CALCIUM 9.4  MG 1.9   ------------------------------------------------------------------------------------------------------------------  Cardiac Enzymes Recent Labs  Lab 06/13/17 1442  TROPONINI <0.03   ------------------------------------------------------------------------------------------------------------------  RADIOLOGY:  No results found.   ASSESSMENT AND PLAN:   76 year old female with past medical history of COPD with ongoing tobacco abuse, anxiety, essential hypertension presented to the hospital due to shortness of breath.  1. Acute on chronic respiratory failure with hypoxia-secondary to COPD exacerbation. -Continue O2 supplementation, discontinue IV steroids, changed to prednisone taper.  Continue Pulmicort nebs, continue duo nebs. Wean down oxygen as tolerated  2. COPD exacerbation-secondary to ongoing tobacco abuse. discontinued IV steroids, changed to prednisone taper.  Continue Pulmicort nebs, continue duo nebs.  Pulmonary consult.  3. Essential hypertension-continue enalapril/HCTZ.  4. Anxiety-continue Xanax.  Generalized weakness.  PT evaluation. All the records are reviewed and case discussed with  Care Management/Social Worker. Management plans discussed with the patient, her daughter and they are  in agreement.  CODE STATUS: Full code  DVT Prophylaxis: Lovenox  TOTAL TIME TAKING CARE OF THIS PATIENT: 32 minutes.   POSSIBLE D/C IN 1-2 DAYS, DEPENDING ON CLINICAL CONDITION.   Shaune Pollackhen, Mollie Rossano M.D on 06/16/2017 at 3:14 PM  Between 7am to 6pm - Pager - [phone]  After 6pm go to [email or web address] - Social research officer, governmentpassword EPAS [facility]  [facility] [place] [facility]  Office  [phone]  CC: Primary care physician; Leotis ShamesSingh, Jasmine, MD

## 2017-06-16 NOTE — Care Management (Signed)
Physical therapy evaluation completed. Recommending home with physical therapy Spoke with Ms. Hautala at the bedside. Would like daughter, Gavin PoundDeborah, to make this decision. Spoke with Gavin Poundeborah [phone]. Informed daughter that there is no co-pay for services in the home. Agreed on [facility]. Feliberto GottronJason Hinton, [facility] representative updated. Gwenette GreetBrenda S Cyan Moultrie RN MSN CCM Care Management [phone]

## 2017-06-16 NOTE — Care Management Important Message (Signed)
Important Message  Patient Details  Name: Candace Butler MRN: [id number] Date of Birth: [birth year]   Medicare Important Message Given:  Yes    Gwenette GreetBrenda S Willie Loy, RN 06/16/2017, 9:38 AM

## 2017-06-16 NOTE — Care Management Note (Signed)
Case Management Note  Patient Details  Name: Candace GibbonGlenda Butler MRN: [id number] Date of Birth: 12-25-1940  Subjective/Objective:  Admitted to [facility] with the diagnosis of COPD. Lives alone. Dr. Thedore MinsSingh is listed as primary care. States she has seen Dr. Thedore MinsSingh this past year. Pres[facility]. No home Health. No skilled nursing. Home oxygen per [employer] at 2 liters continuous x 2 years. Rolling Walker in the home. Self feed, but family helps with baths and dressing. No falls. Fair appetite.                   Action/Plan: Physical therapy evaluation pending.   Expected Discharge Date:  06/16/17               Expected Discharge Plan:     In-House Referral:     Discharge planning Services     Post Acute Care Choice:    Choice offered to:     DME Arranged:    DME Agency:     HH Arranged:    HH Agency:     Status of Service:     If discussed at [employer] Meetings, dates discussed:    Additional Comments:  Gwenette GreetBrenda S Jymir Dunaj, RN MSN CCM Care Management [phone] 06/16/2017, 10:50 AM

## 2017-06-17 LAB — CBC
HEMATOCRIT: 43.3 % (ref 35.0–47.0)
HEMOGLOBIN: 14.5 g/dL (ref 12.0–16.0)
MCH: 31.3 pg (ref 26.0–34.0)
MCHC: 33.6 g/dL (ref 32.0–36.0)
MCV: 93.2 fL (ref 80.0–100.0)
Platelets: 304 10*3/uL (ref 150–440)
RBC: 4.64 MIL/uL (ref 3.80–5.20)
RDW: 13.2 % (ref 11.5–14.5)
WBC: 8.5 10*3/uL (ref 3.6–11.0)

## 2017-06-17 LAB — CREATININE, SERUM
Creatinine, Ser: 0.61 mg/dL (ref 0.44–1.00)
GFR calc Af Amer: >60 mL/min (ref >60)
GFR calc non Af Amer: >60 mL/min (ref >60)

## 2017-06-17 MED ORDER — TIOTROPIUM BROMIDE MONOHYDRATE 18 MCG IN CAPS: 18.0000 ug | ORAL_CAPSULE | Freq: Every day | RESPIRATORY_TRACT | 1 refills | Status: DC

## 2017-06-17 MED ORDER — TIOTROPIUM BROMIDE MONOHYDRATE 18 MCG IN CAPS
18.0000 ug | ORAL_CAPSULE | Freq: Every day | RESPIRATORY_TRACT | Status: DC
  Administered 2017-06-17: 18 ug via RESPIRATORY_TRACT
  Filled 2017-06-17: qty 5

## 2017-06-17 MED ORDER — ALBUTEROL SULFATE (2.5 MG/3ML) 0.083% IN NEBU
2.5000 mg | INHALATION_SOLUTION | Freq: Four times a day (QID) | RESPIRATORY_TRACT | Status: DC
  Administered 2017-06-17 (×2): 2.5 mg via RESPIRATORY_TRACT
  Filled 2017-06-17 (×2): qty 3

## 2017-06-17 NOTE — Care Management (Signed)
Patient to discharge home today with home health services RN and PT through [facility].  Brad with [facility] notified of discharge.  Patient to discharge with an increased liter flow on her home O2.  Fayrene FearingJames with [facility] notified.  updated order and clinical faxed to [phone] [facility] signing off

## 2017-06-17 NOTE — Progress Notes (Signed)
Candace Butler  A and O x 4. VSS. Pt tolerating diet well. No complaints of pain or nausea. No iv access, prescriptions given. Pt and daughter voiced understanding of discharge instructions with no further questions. Pt discharged via wheelchair with nurse. Pt transporting home by daughter with home O2   Candace FeilAbbie Nasim Garofano MSN, RN-BC  Allergies as of 06/17/2017      Reactions   Indomethacin    Vicodin [hydrocodone-acetaminophen]       Medication List    TAKE these medications   ADVAIR DISKUS 250-50 MCG/DOSE Aepb Generic drug:  Fluticasone-Salmeterol Take 1 puff 2 (two) times daily by mouth.   albuterol 108 (90 Base) MCG/ACT inhaler Commonly known as:  PROVENTIL HFA;VENTOLIN HFA Inhale 2 puffs every 6 (six) hours as needed into the lungs for wheezing or shortness of breath.   ALPRAZolam 0.5 MG tablet Commonly known as:  XANAX Take 1 tablet daily by mouth.   alum & mag hydroxide-simeth 200-200-20 MG/5ML suspension Commonly known as:  MAALOX/MYLANTA Take 30 mLs every 4 (four) hours as needed by mouth for indigestion or heartburn.   enalapril-hydrochlorothiazide 10-25 MG tablet Commonly known as:  VASERETIC Take 1 tablet daily by mouth.   guaiFENesin 100 MG/5ML Soln Commonly known as:  ROBITUSSIN Take 5 mLs (100 mg total) every 4 (four) hours as needed by mouth for cough or to loosen phlegm.   predniSONE 10 MG tablet Commonly known as:  DELTASONE 40 mg po daily for 1 day, 30 mg po daily for 1 day, 20 mg po daily for 1 day, 10 mg po daily for 1 day.   tiotropium 18 MCG inhalation capsule Commonly known as:  SPIRIVA Place 1 capsule (18 mcg total) daily into inhaler and inhale. Start taking on:  06/18/2017            Durable Medical Equipment  (From admission, onward)        Start     Ordered   06/17/17 1151  For home use only DME oxygen  Once    Question Answer Comment  Mode or (Route) Nasal cannula   Liters per Minute 4   Frequency Continuous (stationary and portable  oxygen unit needed)   Oxygen conserving device Yes   Oxygen delivery system Gas      06/17/17 1151   06/16/17 1424  For home use only DME Walker rolling  Once    Question:  Patient needs a walker to treat with the following condition  Answer:  Weakness   06/16/17 1424      Vitals:   06/17/17 1325 06/17/17 1525  BP:    Pulse: 99   Resp:    Temp:    SpO2: 95% 93%

## 2017-06-17 NOTE — Progress Notes (Signed)
Notified Dr. Imogene Burnhen that patient was not seen by pulmonology prior to discharge. Per MD still okay for patient to discharge.

## 2017-06-17 NOTE — Progress Notes (Signed)
Verified Per Dr. Imogene Burnhen that it is okay for pt to have no IV access at this time

## 2017-06-17 NOTE — Discharge Instructions (Signed)
Continue home O2 [place] 4L HHPT.

## 2017-06-17 NOTE — Progress Notes (Signed)
Physical Therapy Treatment Patient Details Name: Candace GibbonGlenda Selway MRN: [id number] DOB: 12-05-1940 Today's Date: 06/17/2017    History of Present Illness Pt is a 76 y.o. female presenting to hospital with increasing SOB x1 week and admitted to hospital with acute on chronic respiratory failure with hypoxia d/t COPD exacerbation.  PMH includes COPD on 2 L [place] O2 chronic, htn, asthma.    PT Comments    Pt was able to progress ambulatory distance today with use of RW and O2.  Pt demonstrated fatigue with prolonged standing but was able to negotiate RW to perform lateral and retro stepping as well.  Pt O2 measured 87% following 75 ft of ambulation and required 1 min of sitting to recover back to 93%.  She was unable to perform stair training today due to proximity of stairs to pt room and pt fatigue.  Pt required min assist for most bed mobility but was able to complete therapeutic exercises in full.  Pt will continue to benefit from skilled PT to improve functional mobility, strength and tolerance to activity.  Follow Up Recommendations  Home health PT     Equipment Recommendations       Recommendations for Other Services       Precautions / Restrictions Precautions Precautions: None Restrictions Weight Bearing Restrictions: No    Mobility  Bed Mobility Overal bed mobility: Needs Assistance Bed Mobility: Rolling;Sidelying to Sit;Supine to Sit Rolling: Min assist Sidelying to sit: Modified independent (Device/Increase time) Supine to sit: Min assist Sit to supine: Min assist   General bed mobility comments: Pt requires hand held assist for initiation of movement but is able to complete supine to sit, sit to supine following.  PT educated pt concerning bridging and rolling in bed for mobility and pt was able to demonstrate carryover.  Transfers Overall transfer level: Modified independent Equipment used: Rolling walker (2 wheeled) Transfers: Sit to/from Stand Sit to Stand: Modified  independent (Device/Increase time)         General transfer comment: Pt is able to perform STS with use of RW.  PT educated pt concerning proper use of RW and pt was able to carry over information and demonstrate understanding on her own.  Ambulation/Gait Ambulation/Gait assistance: Modified independent (Device/Increase time) Ambulation Distance (Feet): 75 Feet Assistive device: Rolling walker (2 wheeled)     Gait velocity interpretation: Below normal speed for age/gender General Gait Details: Pt presents with a flexed posture when using RW, low foot clearance and decreased step length.  PT educated pt concerning proper use of RW in proximity to body.   Stairs Stairs: (Pt was unable to perform stairs today due to decreased O2 level and fatigue setting in before pt could ambulate the distance to the stairs.)          Wheelchair Mobility    Modified Rankin (Stroke Patients Only)       Balance Overall balance assessment: Modified Independent Sitting-balance support: Bilateral upper extremity supported                         High level balance activites: Side stepping;Backward walking High Level Balance Comments: Pt is able to execute lateral and retro walking with use of RW and no VC's from PT for sequencing.            Cognition Arousal/Alertness: Awake/alert Behavior During Therapy: WFL for tasks assessed/performed Overall Cognitive Status: Within Functional Limits for tasks assessed  Exercises General Exercises - Lower Extremity Long Arc Quad: Strengthening;Both;10 reps;Seated Hip Flexion/Marching: Strengthening;Both;20 reps;Seated(VC's for controlled motion and education concerning benefit of full muscle contraction.) Other Exercises Other Exercises: Bridging: x10 with min VC's    General Comments        Pertinent Vitals/Pain Pain Assessment: No/denies pain(Reports LE feel "achy" during  ambulation and seated exercises.)    Home Living                      Prior Function            PT Goals (current goals can now be found in the care plan section) Acute Rehab PT Goals Patient Stated Goal: to return home and continue PT with home health. PT Goal Formulation: With patient/family Time For Goal Achievement: 07/01/17 Potential to Achieve Goals: Good Progress towards PT goals: Progressing toward goals    Frequency    Min 2X/week      PT Plan Current plan remains appropriate    Co-evaluation              AM-PAC PT "6 Clicks" Daily Activity  Outcome Measure  Difficulty turning over in bed (including adjusting bedclothes, sheets and blankets)?: A Lot Difficulty moving from lying on back to sitting on the side of the bed? : A Lot Difficulty sitting down on and standing up from a chair with arms (e.g., wheelchair, bedside commode, etc,.)?: A Little Help needed moving to and from a bed to chair (including a wheelchair)?: A Little Help needed walking in hospital room?: A Little Help needed climbing 3-5 steps with a railing? : A Lot 6 Click Score: 15    End of Session Equipment Utilized During Treatment: Gait belt;Oxygen Activity Tolerance: Patient limited by fatigue Patient left: in bed;with family/visitor present;with nursing/sitter in room Nurse Communication: Mobility status PT Visit Diagnosis: Unsteadiness on feet (R26.81);Muscle weakness (generalized) (M62.81)     Time: [id number] PT Time Calculation (min) (ACUTE ONLY): 29 min  Charges:  $Gait Training: 8-22 mins $Therapeutic Activity: 8-22 mins                    G Codes:  Functional Assessment Tool Used: AM-PAC 6 Clicks Basic Mobility    Glenetta HewSarah Lovie Zarling, PT, DPT   Glenetta HewSarah Granvil Djordjevic 06/17/2017, 3:42 PM

## 2017-06-17 NOTE — Discharge Summary (Signed)
[facility] [place] at [facility]   PATIENT NAME: Candace Butler    MR#:  [id number]  DATE OF BIRTH:  1941/05/02  DATE OF ADMISSION:  06/13/2017   ADMITTING PHYSICIAN: Shaune Pollack, MD  DATE OF DISCHARGE: 06/17/2017  PRIMARY CARE PHYSICIAN: Leotis Shames, MD   ADMISSION DIAGNOSIS:  Hypoxia [R09.02] COPD exacerbation (HCC) [J44.1] DISCHARGE DIAGNOSIS:  Active Problems:   COPD exacerbation (HCC)  SECONDARY DIAGNOSIS:   Past Medical History:  Diagnosis Date  . Asthma   . COPD (chronic obstructive pulmonary disease) (HCC)   . Hypertension    HOSPITAL COURSE:   76 year old female with past medical history of COPD with ongoing tobacco abuse, anxiety, essential hypertension presented to the hospital due to shortness of breath.  1. Acute on chronic respiratory failure with hypoxia-secondary to COPD exacerbation. -Continue O2 supplementation, discontinued IV steroids, changed to prednisone taper. Treated Pulmicort nebs, duo nebs.  Added Spiriva.  Continue Advair after discharge.  Follow-up Dr. Meredeth Ide as outpatient. Unable to wean down oxygen. She is hypoxia on exertion.  She needs home O2 [place] 4L.  2. COPD exacerbation-secondary to ongoing tobacco abuse. discontinued IV steroids, changed to prednisone taper.  treated Pulmicort nebs, duo nebs.  Added Spiriva.  Continue Advair after discharge.  Follow-up Dr. Meredeth Ide as outpatient.   3. Essential hypertension-continue enalapril/HCTZ.  4. Anxiety-continue Xanax DISCHARGE CONDITIONS:  Stable, discharge to home with home health and PT today. CONSULTS OBTAINED:  Treatment Team:  Shane Crutch, MD Mertie Moores, MD DRUG ALLERGIES:   Allergies  Allergen Reactions  . Indomethacin   . Vicodin [Hydrocodone-Acetaminophen]    DISCHARGE MEDICATIONS:   Allergies as of 06/17/2017      Reactions   Indomethacin    Vicodin [hydrocodone-acetaminophen]       Medication List    TAKE these medications     ADVAIR DISKUS 250-50 MCG/DOSE Aepb Generic drug:  Fluticasone-Salmeterol Take 1 puff 2 (two) times daily by mouth.   albuterol 108 (90 Base) MCG/ACT inhaler Commonly known as:  PROVENTIL HFA;VENTOLIN HFA Inhale 2 puffs every 6 (six) hours as needed into the lungs for wheezing or shortness of breath.   ALPRAZolam 0.5 MG tablet Commonly known as:  XANAX Take 1 tablet daily by mouth.   alum & mag hydroxide-simeth 200-200-20 MG/5ML suspension Commonly known as:  MAALOX/MYLANTA Take 30 mLs every 4 (four) hours as needed by mouth for indigestion or heartburn.   enalapril-hydrochlorothiazide 10-25 MG tablet Commonly known as:  VASERETIC Take 1 tablet daily by mouth.   guaiFENesin 100 MG/5ML Soln Commonly known as:  ROBITUSSIN Take 5 mLs (100 mg total) every 4 (four) hours as needed by mouth for cough or to loosen phlegm.   predniSONE 10 MG tablet Commonly known as:  DELTASONE 40 mg po daily for 1 day, 30 mg po daily for 1 day, 20 mg po daily for 1 day, 10 mg po daily for 1 day.   tiotropium 18 MCG inhalation capsule Commonly known as:  SPIRIVA Place 1 capsule (18 mcg total) daily into inhaler and inhale. Start taking on:  06/18/2017            Durable Medical Equipment  (From admission, onward)        Start     Ordered   06/17/17 1151  For home use only DME oxygen  Once    Question Answer Comment  Mode or (Route) Nasal cannula   Liters per Minute 4   Frequency Continuous (stationary and portable  oxygen unit needed)   Oxygen conserving device Yes   Oxygen delivery system Gas      06/17/17 1151   06/16/17 1424  For home use only DME Walker rolling  Once    Question:  Patient needs a walker to treat with the following condition  Answer:  Weakness   06/16/17 1424       DISCHARGE INSTRUCTIONS:  See AVS. If you experience worsening of your admission symptoms, develop shortness of breath, life threatening emergency, suicidal or homicidal thoughts you must seek  medical attention immediately by calling 911 or calling your MD immediately  if symptoms less severe.  You Must read complete instructions/literature along with all the possible adverse reactions/side effects for all the Medicines you take and that have been prescribed to you. Take any new Medicines after you have completely understood and accpet all the possible adverse reactions/side effects.   Please note  You were cared for by a hospitalist during your hospital stay. If you have any questions about your discharge medications or the care you received while you were in the hospital after you are discharged, you can call the unit and asked to speak with the hospitalist on call if the hospitalist that took care of you is not available. Once you are discharged, your primary care physician will handle any further medical issues. Please note that NO REFILLS for any discharge medications will be authorized once you are discharged, as it is imperative that you return to your primary care physician (or establish a relationship with a primary care physician if you do not have one) for your aftercare needs so that they can reassess your need for medications and monitor your lab values.    On the day of Discharge:  VITAL SIGNS:  Blood pressure 114/68, pulse 99, temperature 98.8 F (37.1 C), temperature source Oral, resp. rate 20, height 5\' 1"  (1.549 m), weight 166 lb 0.1 oz (75.3 kg), SpO2 95 %. PHYSICAL EXAMINATION:  GENERAL:  76 y.o.-year-old patient lying in the bed with no acute distress.  EYES: Pupils equal, round, reactive to light and accommodation. No scleral icterus. Extraocular muscles intact.  HEENT: Head atraumatic, normocephalic. Oropharynx and nasopharynx clear.  NECK:  Supple, no jugular venous distention. No thyroid enlargement, no tenderness.  LUNGS: Normal breath sounds bilaterally, no wheezing, rales,rhonchi or crepitation. No use of accessory muscles of respiration.  CARDIOVASCULAR:  S1, S2 normal. No murmurs, rubs, or gallops.  ABDOMEN: Soft, non-tender, non-distended. Bowel sounds present. No organomegaly or mass.  EXTREMITIES: No pedal edema, cyanosis, or clubbing.  NEUROLOGIC: Cranial nerves II through XII are intact. Muscle strength 4/5 in all extremities. Sensation intact. Gait not checked.  PSYCHIATRIC: The patient is alert and oriented x 3.  SKIN: No obvious rash, lesion, or ulcer.  DATA REVIEW:   CBC Recent Labs  Lab 06/17/17 0840  WBC 8.5  HGB 14.5  HCT 43.3  PLT 304    Chemistries  Recent Labs  Lab 06/13/17 1442 06/17/17 0840  NA 134*  --   K 4.1  --   CL 84*  --   CO2 37*  --   GLUCOSE 104*  --   BUN 21*  --   CREATININE 0.80 0.61  CALCIUM 9.4  --   MG 1.9  --      Microbiology Results  No results found for this or any previous visit.  RADIOLOGY:  No results found.   Management plans discussed with the patient, her daughter and they  are in agreement.  CODE STATUS: Full Code   TOTAL TIME TAKING CARE OF THIS PATIENT: 35 minutes.    Shaune Pollackhen, Aydrien Froman M.D on 06/17/2017 at 1:50 PM  Between 7am to 6pm - Pager - [phone]  After 6pm go to [email or web address] - Social research officer, governmentpassword [email or web address]  [facility] [place] [facility]  Office  [phone]  CC: Primary care physician; Leotis ShamesSingh, Jasmine, MD   Note: This dictation was prepared with Dragon dictation along with smaller phrase technology. Any transcriptional errors that result from this process are unintentional.

## 2017-12-08 ENCOUNTER — Other Ambulatory Visit: Payer: Self-pay

## 2017-12-08 ENCOUNTER — Emergency Department: Payer: Medicare HMO

## 2017-12-08 ENCOUNTER — Inpatient Hospital Stay: Payer: Medicare HMO

## 2017-12-08 ENCOUNTER — Inpatient Hospital Stay
Admission: EM | Admit: 2017-12-08 | Discharge: 2017-12-19 | DRG: 208 | Disposition: A | Discharge status: Skilled Nursing Facility | Payer: Medicare HMO | Attending: Family Medicine | Admitting: Family Medicine

## 2017-12-08 ENCOUNTER — Inpatient Hospital Stay
Admit: 2017-12-08 | Discharge: 2017-12-08 | Disposition: A | Discharge status: Home / Self Care | Payer: Medicare HMO | Attending: Internal Medicine | Admitting: Internal Medicine

## 2017-12-08 DIAGNOSIS — I5031 Acute diastolic (congestive) heart failure: Secondary | ICD-10-CM | POA: Diagnosis not present

## 2017-12-08 DIAGNOSIS — F17210 Nicotine dependence, cigarettes, uncomplicated: Secondary | ICD-10-CM | POA: Diagnosis present

## 2017-12-08 DIAGNOSIS — I110 Hypertensive heart disease with heart failure: Secondary | ICD-10-CM | POA: Diagnosis present

## 2017-12-08 DIAGNOSIS — Z90710 Acquired absence of both cervix and uterus: Secondary | ICD-10-CM

## 2017-12-08 DIAGNOSIS — Z66 Do not resuscitate: Secondary | ICD-10-CM | POA: Diagnosis present

## 2017-12-08 DIAGNOSIS — Z888 Allergy status to other drugs, medicaments and biological substances status: Secondary | ICD-10-CM | POA: Diagnosis not present

## 2017-12-08 DIAGNOSIS — J9621 Acute and chronic respiratory failure with hypoxia: Principal | ICD-10-CM | POA: Diagnosis present

## 2017-12-08 DIAGNOSIS — E871 Hypo-osmolality and hyponatremia: Secondary | ICD-10-CM | POA: Diagnosis not present

## 2017-12-08 DIAGNOSIS — J9602 Acute respiratory failure with hypercapnia: Secondary | ICD-10-CM | POA: Diagnosis not present

## 2017-12-08 DIAGNOSIS — Z6834 Body mass index (BMI) 34.0-34.9, adult: Secondary | ICD-10-CM

## 2017-12-08 DIAGNOSIS — Z4659 Encounter for fitting and adjustment of other gastrointestinal appliance and device: Secondary | ICD-10-CM

## 2017-12-08 DIAGNOSIS — E6609 Other obesity due to excess calories: Secondary | ICD-10-CM | POA: Diagnosis present

## 2017-12-08 DIAGNOSIS — J9601 Acute respiratory failure with hypoxia: Secondary | ICD-10-CM

## 2017-12-08 DIAGNOSIS — J189 Pneumonia, unspecified organism: Secondary | ICD-10-CM | POA: Diagnosis present

## 2017-12-08 DIAGNOSIS — J440 Chronic obstructive pulmonary disease with acute lower respiratory infection: Secondary | ICD-10-CM | POA: Diagnosis present

## 2017-12-08 DIAGNOSIS — I952 Hypotension due to drugs: Secondary | ICD-10-CM | POA: Diagnosis present

## 2017-12-08 DIAGNOSIS — F419 Anxiety disorder, unspecified: Secondary | ICD-10-CM | POA: Diagnosis present

## 2017-12-08 DIAGNOSIS — E2740 Unspecified adrenocortical insufficiency: Secondary | ICD-10-CM | POA: Diagnosis not present

## 2017-12-08 DIAGNOSIS — G9341 Metabolic encephalopathy: Secondary | ICD-10-CM | POA: Diagnosis not present

## 2017-12-08 DIAGNOSIS — D649 Anemia, unspecified: Secondary | ICD-10-CM | POA: Diagnosis present

## 2017-12-08 DIAGNOSIS — Z8249 Family history of ischemic heart disease and other diseases of the circulatory system: Secondary | ICD-10-CM

## 2017-12-08 DIAGNOSIS — D62 Acute posthemorrhagic anemia: Secondary | ICD-10-CM | POA: Diagnosis not present

## 2017-12-08 DIAGNOSIS — R0602 Shortness of breath: Secondary | ICD-10-CM

## 2017-12-08 DIAGNOSIS — J9622 Acute and chronic respiratory failure with hypercapnia: Secondary | ICD-10-CM | POA: Diagnosis present

## 2017-12-08 DIAGNOSIS — E876 Hypokalemia: Secondary | ICD-10-CM | POA: Diagnosis not present

## 2017-12-08 DIAGNOSIS — Z9981 Dependence on supplemental oxygen: Secondary | ICD-10-CM | POA: Diagnosis not present

## 2017-12-08 DIAGNOSIS — J441 Chronic obstructive pulmonary disease with (acute) exacerbation: Secondary | ICD-10-CM | POA: Diagnosis present

## 2017-12-08 DIAGNOSIS — K921 Melena: Secondary | ICD-10-CM | POA: Diagnosis not present

## 2017-12-08 DIAGNOSIS — E222 Syndrome of inappropriate secretion of antidiuretic hormone: Secondary | ICD-10-CM | POA: Diagnosis present

## 2017-12-08 DIAGNOSIS — I82409 Acute embolism and thrombosis of unspecified deep veins of unspecified lower extremity: Secondary | ICD-10-CM

## 2017-12-08 DIAGNOSIS — J449 Chronic obstructive pulmonary disease, unspecified: Secondary | ICD-10-CM | POA: Diagnosis not present

## 2017-12-08 DIAGNOSIS — I5033 Acute on chronic diastolic (congestive) heart failure: Secondary | ICD-10-CM | POA: Diagnosis present

## 2017-12-08 DIAGNOSIS — J9690 Respiratory failure, unspecified, unspecified whether with hypoxia or hypercapnia: Secondary | ICD-10-CM

## 2017-12-08 DIAGNOSIS — R7989 Other specified abnormal findings of blood chemistry: Secondary | ICD-10-CM

## 2017-12-08 LAB — CBC WITH DIFFERENTIAL/PLATELET
BASOS PCT: 0 %
Basophils Absolute: 0 10*3/uL (ref 0–0.1)
Eosinophils Absolute: 0 10*3/uL (ref 0–0.7)
Eosinophils Relative: 0 %
HEMATOCRIT: 37.2 % (ref 35.0–47.0)
HEMOGLOBIN: 12.7 g/dL (ref 12.0–16.0)
LYMPHS ABS: 0.6 10*3/uL — AB (ref 1.0–3.6)
Lymphocytes Relative: 8 %
MCH: 30.8 pg (ref 26.0–34.0)
MCHC: 34 g/dL (ref 32.0–36.0)
MCV: 90.7 fL (ref 80.0–100.0)
Monocytes Absolute: 0.6 10*3/uL (ref 0.2–0.9)
Monocytes Relative: 7 %
NEUTROS ABS: 7.1 10*3/uL — AB (ref 1.4–6.5)
NEUTROS PCT: 85 %
Platelets: 403 10*3/uL (ref 150–440)
RBC: 4.11 MIL/uL (ref 3.80–5.20)
RDW: 12.9 % (ref 11.5–14.5)
WBC: 8.4 10*3/uL (ref 3.6–11.0)

## 2017-12-08 LAB — COMPREHENSIVE METABOLIC PANEL
ALBUMIN: 3.8 g/dL (ref 3.5–5.0)
ALT: 25 U/L (ref 14–54)
AST: 36 U/L (ref 15–41)
Alkaline Phosphatase: 58 U/L (ref 38–126)
Anion gap: 8 (ref 5–15)
BUN: 17 mg/dL (ref 6–20)
CHLORIDE: 67 mmol/L — AB (ref 101–111)
CO2: 44 mmol/L — AB (ref 22–32)
CREATININE: 0.5 mg/dL (ref 0.44–1.00)
Calcium: 8.6 mg/dL — ABNORMAL LOW (ref 8.9–10.3)
GFR calc Af Amer: >60 mL/min (ref >60)
GLUCOSE: 161 mg/dL — AB (ref 65–99)
Potassium: 3.6 mmol/L (ref 3.5–5.1)
Sodium: 119 mmol/L — CL (ref 135–145)
Total Bilirubin: 0.6 mg/dL (ref 0.3–1.2)
Total Protein: 7.6 g/dL (ref 6.5–8.1)

## 2017-12-08 LAB — MRSA PCR SCREENING: MRSA by PCR: NEGATIVE

## 2017-12-08 LAB — BLOOD GAS, VENOUS
ACID-BASE EXCESS: 20.6 mmol/L — AB (ref 0.0–2.0)
Bicarbonate: 53.7 mmol/L — ABNORMAL HIGH (ref 20.0–28.0)
Delivery systems: POSITIVE
FIO2: 0.8
O2 SAT: 75.2 %
PATIENT TEMPERATURE: 37
pCO2, Ven: 117 mmHg (ref 44.0–60.0)
pH, Ven: 7.27 (ref 7.250–7.430)
pO2, Ven: 46 mmHg — ABNORMAL HIGH (ref 32.0–45.0)

## 2017-12-08 LAB — TROPONIN I
TROPONIN I: 0.15 ng/mL — AB (ref <0.03)
Troponin I: <0.03 ng/mL (ref <0.03)
Troponin I: <0.03 ng/mL (ref <0.03)

## 2017-12-08 LAB — URINALYSIS, COMPLETE (UACMP) WITH MICROSCOPIC
BACTERIA UA: NONE SEEN
BILIRUBIN URINE: NEGATIVE
Glucose, UA: NEGATIVE mg/dL
Hgb urine dipstick: NEGATIVE
Ketones, ur: 5 mg/dL — AB
Leukocytes, UA: NEGATIVE
Nitrite: NEGATIVE
Specific Gravity, Urine: 1.024 (ref 1.005–1.030)
pH: 6 (ref 5.0–8.0)

## 2017-12-08 LAB — ECHOCARDIOGRAM COMPLETE: WEIGHTICAEL: 2880 [oz_av]

## 2017-12-08 LAB — MAGNESIUM: MAGNESIUM: 1.5 mg/dL — AB (ref 1.7–2.4)

## 2017-12-08 LAB — GLUCOSE, CAPILLARY: Glucose-Capillary: 165 mg/dL — ABNORMAL HIGH (ref 65–99)

## 2017-12-08 LAB — BRAIN NATRIURETIC PEPTIDE: B NATRIURETIC PEPTIDE 5: 321 pg/mL — AB (ref 0.0–100.0)

## 2017-12-08 LAB — OSMOLALITY: OSMOLALITY: 267 mosm/kg — AB (ref 275–295)

## 2017-12-08 LAB — OSMOLALITY, URINE: OSMOLALITY UR: 457 mosm/kg (ref 300–900)

## 2017-12-08 LAB — SODIUM, URINE, RANDOM: SODIUM UR: 12 mmol/L

## 2017-12-08 MED ORDER — IPRATROPIUM-ALBUTEROL 0.5-2.5 (3) MG/3ML IN SOLN
3.0000 mL | Freq: Once | RESPIRATORY_TRACT | Status: AC
Start: 2017-12-08 — End: 2017-12-08
  Administered 2017-12-08: 3 mL via RESPIRATORY_TRACT

## 2017-12-08 MED ORDER — FENTANYL 2500MCG IN NS 250ML (10MCG/ML) PREMIX INFUSION
25.0000 ug/h | INTRAVENOUS | Status: DC
  Administered 2017-12-08: 25 ug/h via INTRAVENOUS
  Administered 2017-12-09: 120 ug/h via INTRAVENOUS
  Administered 2017-12-09: 60 ug/h via INTRAVENOUS
  Administered 2017-12-09: 100 ug/h via INTRAVENOUS
  Administered 2017-12-10 (×2): 150 ug/h via INTRAVENOUS
  Filled 2017-12-08 (×3): qty 250

## 2017-12-08 MED ORDER — LEVOFLOXACIN IN D5W 750 MG/150ML IV SOLN
750.0000 mg | INTRAVENOUS | Status: DC
  Administered 2017-12-08: 750 mg via INTRAVENOUS
  Filled 2017-12-08: qty 150

## 2017-12-08 MED ORDER — MIDAZOLAM HCL 5 MG/5ML IJ SOLN
INTRAMUSCULAR | Status: AC
  Administered 2017-12-08: 2 mg via INTRAVENOUS
  Filled 2017-12-08: qty 5

## 2017-12-08 MED ORDER — BUDESONIDE 0.5 MG/2ML IN SUSP
0.5000 mg | Freq: Two times a day (BID) | RESPIRATORY_TRACT | Status: DC
Start: 2017-12-08 — End: 2017-12-19
  Administered 2017-12-08 – 2017-12-19 (×22): 0.5 mg via RESPIRATORY_TRACT
  Filled 2017-12-08 (×22): qty 2

## 2017-12-08 MED ORDER — IPRATROPIUM-ALBUTEROL 0.5-2.5 (3) MG/3ML IN SOLN
3.0000 mL | Freq: Four times a day (QID) | RESPIRATORY_TRACT | Status: DC
  Administered 2017-12-08 – 2017-12-12 (×15): 3 mL via RESPIRATORY_TRACT
  Filled 2017-12-08 (×13): qty 3

## 2017-12-08 MED ORDER — ALBUTEROL SULFATE (2.5 MG/3ML) 0.083% IN NEBU
2.5000 mg | INHALATION_SOLUTION | Freq: Once | RESPIRATORY_TRACT | Status: AC
  Administered 2017-12-08: 2.5 mg via RESPIRATORY_TRACT
  Filled 2017-12-08: qty 3

## 2017-12-08 MED ORDER — SODIUM CHLORIDE 0.9 % IV SOLN
2.0000 g | Freq: Three times a day (TID) | INTRAVENOUS | Status: DC
  Administered 2017-12-08 – 2017-12-09 (×3): 2 g via INTRAVENOUS
  Filled 2017-12-08 (×5): qty 2

## 2017-12-08 MED ORDER — FENTANYL CITRATE (PF) 100 MCG/2ML IJ SOLN
50.0000 ug | Freq: Once | INTRAMUSCULAR | Status: AC
  Administered 2017-12-08: 50 ug via INTRAVENOUS
  Filled 2017-12-08: qty 2

## 2017-12-08 MED ORDER — ACETAMINOPHEN 325 MG PO TABS
650.0000 mg | ORAL_TABLET | ORAL | Status: DC | PRN
  Administered 2017-12-08 – 2017-12-19 (×6): 650 mg via ORAL
  Filled 2017-12-08 (×6): qty 2

## 2017-12-08 MED ORDER — ORAL CARE MOUTH RINSE
15.0000 mL | Freq: Four times a day (QID) | OROMUCOSAL | Status: DC
  Administered 2017-12-08 – 2017-12-09 (×4): 15 mL via OROMUCOSAL

## 2017-12-08 MED ORDER — FLUTICASONE FUROATE-VILANTEROL 200-25 MCG/INH IN AEPB
1.0000 | INHALATION_SPRAY | Freq: Every day | RESPIRATORY_TRACT | Status: DC
  Filled 2017-12-08: qty 28

## 2017-12-08 MED ORDER — SODIUM CHLORIDE 0.9 % IV SOLN
250.0000 mL | INTRAVENOUS | Status: DC | PRN
  Administered 2017-12-13: 03:00:00 250 mL via INTRAVENOUS

## 2017-12-08 MED ORDER — METHYLPREDNISOLONE SODIUM SUCC 125 MG IJ SOLR
60.0000 mg | Freq: Four times a day (QID) | INTRAMUSCULAR | Status: DC
  Administered 2017-12-08 – 2017-12-09 (×4): 60 mg via INTRAVENOUS
  Filled 2017-12-08 (×4): qty 2

## 2017-12-08 MED ORDER — SODIUM CHLORIDE 0.9 % IV BOLUS
1000.0000 mL | Freq: Once | INTRAVENOUS | Status: AC
  Administered 2017-12-08: 1000 mL via INTRAVENOUS

## 2017-12-08 MED ORDER — FENTANYL BOLUS VIA INFUSION
25.0000 ug | INTRAVENOUS | Status: DC | PRN
  Administered 2017-12-09 – 2017-12-10 (×3): 25 ug via INTRAVENOUS
  Filled 2017-12-08: qty 25

## 2017-12-08 MED ORDER — KETAMINE HCL 10 MG/ML IJ SOLN
100.0000 mg | INTRAMUSCULAR | Status: AC
  Administered 2017-12-08: 100 mg via INTRAVENOUS

## 2017-12-08 MED ORDER — MIDAZOLAM HCL 2 MG/2ML IJ SOLN
2.0000 mg | Freq: Once | INTRAMUSCULAR | Status: AC
  Administered 2017-12-08: 2 mg via INTRAVENOUS
  Filled 2017-12-08: qty 2

## 2017-12-08 MED ORDER — METHYLPREDNISOLONE SODIUM SUCC 125 MG IJ SOLR
INTRAMUSCULAR | Status: AC
  Filled 2017-12-08: qty 2

## 2017-12-08 MED ORDER — MAGNESIUM SULFATE 2 GM/50ML IV SOLN
2.0000 g | Freq: Once | INTRAVENOUS | Status: AC
  Administered 2017-12-08: 2 g via INTRAVENOUS
  Filled 2017-12-08: qty 50

## 2017-12-08 MED ORDER — ONDANSETRON HCL 4 MG/2ML IJ SOLN
4.0000 mg | Freq: Four times a day (QID) | INTRAMUSCULAR | Status: DC | PRN
Start: 2017-12-08 — End: 2017-12-19
  Administered 2017-12-11 – 2017-12-14 (×2): 4 mg via INTRAVENOUS
  Filled 2017-12-08 (×2): qty 2

## 2017-12-08 MED ORDER — HEPARIN SODIUM (PORCINE) 5000 UNIT/ML IJ SOLN
5000.0000 [IU] | Freq: Three times a day (TID) | INTRAMUSCULAR | Status: DC
  Administered 2017-12-08 – 2017-12-13 (×16): 5000 [IU] via SUBCUTANEOUS
  Filled 2017-12-08 (×16): qty 1

## 2017-12-08 MED ORDER — IPRATROPIUM-ALBUTEROL 0.5-2.5 (3) MG/3ML IN SOLN
RESPIRATORY_TRACT | Status: AC
  Filled 2017-12-08: qty 6

## 2017-12-08 MED ORDER — CHLORHEXIDINE GLUCONATE 0.12% ORAL RINSE (MEDLINE KIT)
15.0000 mL | Freq: Two times a day (BID) | OROMUCOSAL | Status: DC
  Administered 2017-12-08: 15 mL via OROMUCOSAL

## 2017-12-08 MED ORDER — ORAL CARE MOUTH RINSE
15.0000 mL | Freq: Four times a day (QID) | OROMUCOSAL | Status: DC
  Administered 2017-12-08: 15 mL via OROMUCOSAL

## 2017-12-08 MED ORDER — IPRATROPIUM-ALBUTEROL 0.5-2.5 (3) MG/3ML IN SOLN
3.0000 mL | Freq: Once | RESPIRATORY_TRACT | Status: AC
  Administered 2017-12-08: 3 mL via RESPIRATORY_TRACT

## 2017-12-08 MED ORDER — MIDAZOLAM HCL 5 MG/5ML IJ SOLN
2.0000 mg | Freq: Once | INTRAMUSCULAR | Status: AC
  Administered 2017-12-08: 2 mg via INTRAVENOUS

## 2017-12-08 MED ORDER — METHYLPREDNISOLONE SODIUM SUCC 125 MG IJ SOLR
125.0000 mg | Freq: Once | INTRAMUSCULAR | Status: AC
  Administered 2017-12-08: 125 mg via INTRAVENOUS

## 2017-12-08 MED ORDER — KETAMINE HCL 10 MG/ML IJ SOLN
INTRAMUSCULAR | Status: AC
  Administered 2017-12-08: 100 mg via INTRAVENOUS
  Filled 2017-12-08: qty 1

## 2017-12-08 MED ORDER — FAMOTIDINE IN NACL 20-0.9 MG/50ML-% IV SOLN
20.0000 mg | Freq: Two times a day (BID) | INTRAVENOUS | Status: DC
Start: 2017-12-08 — End: 2017-12-12
  Administered 2017-12-08 – 2017-12-11 (×8): 20 mg via INTRAVENOUS
  Filled 2017-12-08 (×8): qty 50

## 2017-12-08 MED ORDER — SODIUM CHLORIDE 0.9 % IV SOLN
500.0000 mg | INTRAVENOUS | Status: AC
  Administered 2017-12-09 – 2017-12-12 (×4): 500 mg via INTRAVENOUS
  Filled 2017-12-08 (×5): qty 500

## 2017-12-08 MED ORDER — SODIUM CHLORIDE 0.9 % IV SOLN
INTRAVENOUS | Status: DC
  Administered 2017-12-08 – 2017-12-10 (×3): via INTRAVENOUS

## 2017-12-08 MED ORDER — SODIUM CHLORIDE 0.9 % IV SOLN
0.5000 mg/h | INTRAVENOUS | Status: DC
  Administered 2017-12-08: 0.5 mg/h via INTRAVENOUS
  Administered 2017-12-09 (×3): 2 mg/h via INTRAVENOUS
  Filled 2017-12-08 (×3): qty 10

## 2017-12-08 MED ORDER — ASPIRIN 81 MG PO CHEW
324.0000 mg | CHEWABLE_TABLET | ORAL | Status: AC
  Administered 2017-12-08: 324 mg via ORAL
  Filled 2017-12-08: qty 4

## 2017-12-08 MED ORDER — ALBUTEROL SULFATE (2.5 MG/3ML) 0.083% IN NEBU
2.5000 mg | INHALATION_SOLUTION | RESPIRATORY_TRACT | Status: DC | PRN
  Administered 2017-12-11: 2.5 mg via RESPIRATORY_TRACT
  Filled 2017-12-08: qty 3

## 2017-12-08 MED ORDER — SUCCINYLCHOLINE CHLORIDE 20 MG/ML IJ SOLN
100.0000 mg | Freq: Once | INTRAMUSCULAR | Status: AC
  Administered 2017-12-08: 100 mg via INTRAVENOUS

## 2017-12-08 MED ORDER — CHLORHEXIDINE GLUCONATE 0.12% ORAL RINSE (MEDLINE KIT)
15.0000 mL | Freq: Two times a day (BID) | OROMUCOSAL | Status: DC
  Administered 2017-12-08 – 2017-12-19 (×16): 15 mL via OROMUCOSAL

## 2017-12-08 MED ORDER — DOCUSATE SODIUM 50 MG/5ML PO LIQD
100.0000 mg | Freq: Two times a day (BID) | ORAL | Status: DC | PRN
  Administered 2017-12-13: 11:00:00 100 mg
  Filled 2017-12-08: qty 10

## 2017-12-08 MED ORDER — ASPIRIN 300 MG RE SUPP: 300.0000 mg | RECTAL | Status: AC

## 2017-12-08 NOTE — ED Notes (Signed)
Pt arousable to physical stimulation currently. Family at bedside.

## 2017-12-08 NOTE — ED Triage Notes (Addendum)
Pt to ER via [facility] from home for respiratory distress. Daughter called ambulance. Pt increased SOB since Saturday. Pt skin color grey upon EMS arrival, oxygen in 70's. Wears 3.5L [place] at baseline. Pt eyes closed, appears fatigued. Increased WOB, accessory muscle use. Eyes open to voice, alert to self and situation at this time.  Arrives on albuterol nebulizer with EMS. IV attempt X 2 unsuccessful by EMS.

## 2017-12-08 NOTE — ED Notes (Addendum)
RT at bedside. family understands need for intubation, agreeable. Explained to patient.  Placed on Zoll pads.

## 2017-12-08 NOTE — H&P (Signed)
[facility] - [place] at [facility]   PATIENT NAME: Candace Butler    MR#:  [id number]  DATE OF BIRTH:  22-Jan-1941  DATE OF ADMISSION:  12/08/2017  PRIMARY CARE PHYSICIAN: Leotis Shames, MD   REQUESTING/REFERRING PHYSICIAN:   CHIEF COMPLAINT:   Chief Complaint  Patient presents with  . Respiratory Distress    HISTORY OF PRESENT ILLNESS: Candace Butler  is a 77 y.o. female with a known history per below which also includes chronic hypoxic respiratory failure on 3 L chronically at home, presenting with acute shortness of breath for 5 days, found by family to be in acute respiratory distress, brought to the emergency room via EMS, noted O2 saturation in the 70s, patient did require BiPAP on arrival-no improvement however in symptomatology, patient found to have acute respiratory acidosis on blood gas with PCO2 greater than 100/pH 7.2 on venous blood gas and discussion with ED attending, chest x-ray was negative, white count normal, patient subsequently intubated, patient evaluated at the bedside, resting comfortably in bed, blood pressure was initially high, now systolically in the 90s post intubation, currently receiving IV fluids, daughters at the bedside, patient is now being admitted for acute on chronic hypoxic hypercapnic respiratory failure, acute asthma/COPD exacerbation.  PAST MEDICAL HISTORY:   Past Medical History:  Diagnosis Date  . Asthma   . COPD (chronic obstructive pulmonary disease) (HCC)   . Hypertension     PAST SURGICAL HISTORY:  Past Surgical History:  Procedure Laterality Date  . ABDOMINAL HYSTERECTOMY    . COLON SURGERY      SOCIAL HISTORY:  Social History   Tobacco Use  . Smoking status: Current Every Day Smoker    Types: Cigarettes  . Smokeless tobacco: Never Used  Substance Use Topics  . Alcohol use: No    Frequency: Never    FAMILY HISTORY:  HTN  DRUG ALLERGIES:  Allergies  Allergen Reactions  . Indomethacin   . Vicodin  [Hydrocodone-Acetaminophen]     REVIEW OF SYSTEMS:  Unable to be obtained given comatose state  MEDICATIONS AT HOME:  Prior to Admission medications   Medication Sig Start Date End Date Taking? Authorizing Provider  ALPRAZolam Prudy Feeler) 0.5 MG tablet Take 0.25 tablets by mouth 2 (two) times daily as needed for anxiety.  05/23/17  Yes [provider]  enalapril-hydrochlorothiazide (VASERETIC) 10-25 MG tablet Take 1 tablet daily by mouth. 04/12/17  Yes [provider]  ADVAIR DISKUS 250-50 MCG/DOSE AEPB Take 1 puff 2 (two) times daily by mouth. 05/28/17   [provider]  albuterol (PROVENTIL HFA;VENTOLIN HFA) 108 (90 Base) MCG/ACT inhaler Inhale 2 puffs every 6 (six) hours as needed into the lungs for wheezing or shortness of breath. 06/16/17   Shaune Pollack, MD  alum & mag hydroxide-simeth (MAALOX/MYLANTA) 200-200-20 MG/5ML suspension Take 30 mLs every 4 (four) hours as needed by mouth for indigestion or heartburn. Patient not taking: Reported on 12/08/2017 06/16/17   Shaune Pollack, MD  guaiFENesin (ROBITUSSIN) 100 MG/5ML SOLN Take 5 mLs (100 mg total) every 4 (four) hours as needed by mouth for cough or to loosen phlegm. Patient not taking: Reported on 12/08/2017 06/15/17   Shaune Pollack, MD  predniSONE (DELTASONE) 10 MG tablet 40 mg po daily for 1 day, 30 mg po daily for 1 day, 20 mg po daily for 1 day, 10 mg po daily for 1 day. Patient not taking: Reported on 12/08/2017 06/15/17   Shaune Pollack, MD  tiotropium [facility]) 18 MCG inhalation capsule Place  1 capsule (18 mcg total) daily into inhaler and inhale. 06/18/17   Shaune Pollack, MD      PHYSICAL EXAMINATION:   VITAL SIGNS: Blood pressure 91/69, pulse 90, temperature 98.3 F (36.8 C), temperature source Oral, resp. rate 20, weight 81.6 kg (180 lb), SpO2 94 %.  GENERAL:  77 y.o.-year-old patient lying in the bed with mild acute distress.  EYES: Pupils equal, round, reactive to light and accommodation. No scleral icterus  HEENT: Head  atraumatic, normocephalic. Oropharynx and nasopharynx clear.  NECK:  Supple, no jugular venous distention. No thyroid enlargement, no tenderness.  LUNGS: Diffuse rhonchi/wheezing throughout. No use of accessory muscles of respiration.  On ventilator CARDIOVASCULAR: S1, S2 normal. No murmurs, rubs, or gallops.  ABDOMEN: Soft, nontender, nondistended. Bowel sounds present. No organomegaly or mass.  EXTREMITIES: No pedal edema, cyanosis, or clubbing.  NEUROLOGIC: PERRL. MAES. Gait not checked.  PSYCHIATRIC: The patient is comatose due to sedatives   SKIN: No obvious rash, lesion, or ulcer.   LABORATORY PANEL:   CBC Recent Labs  Lab 12/08/17 0740  WBC 8.4  HGB 12.7  HCT 37.2  PLT 403  MCV 90.7  MCH 30.8  MCHC 34.0  RDW 12.9  LYMPHSABS 0.6*  MONOABS 0.6  EOSABS 0.0  BASOSABS 0.0   ------------------------------------------------------------------------------------------------------------------  Chemistries  Recent Labs  Lab 12/08/17 0740  NA 119*  K 3.6  CL 67*  CO2 44*  GLUCOSE 161*  BUN 17  CREATININE 0.50  CALCIUM 8.6*  MG 1.5*  AST 36  ALT 25  ALKPHOS 58  BILITOT 0.6   ------------------------------------------------------------------------------------------------------------------ estimated creatinine clearance is 55.2 mL/min (by C-G formula based on SCr of 0.5 mg/dL). ------------------------------------------------------------------------------------------------------------------ No results for input(s): TSH, T4TOTAL, T3FREE, THYROIDAB in the last 72 hours.  Invalid input(s): FREET3   Coagulation profile No results for input(s): INR, PROTIME in the last 168 hours. ------------------------------------------------------------------------------------------------------------------- No results for input(s): DDIMER in the last 72  hours. -------------------------------------------------------------------------------------------------------------------  Cardiac Enzymes Recent Labs  Lab 12/08/17 0740  TROPONINI <0.03   ------------------------------------------------------------------------------------------------------------------ Invalid input(s): POCBNP  ---------------------------------------------------------------------------------------------------------------  Urinalysis No results found for: COLORURINE, APPEARANCEUR, LABSPEC, PHURINE, GLUCOSEU, HGBUR, BILIRUBINUR, KETONESUR, PROTEINUR, UROBILINOGEN, NITRITE, LEUKOCYTESUR   RADIOLOGY: Dg Chest Portable 1 View  Result Date: 12/08/2017 CLINICAL DATA:  Asthma-COPD.  Recent intubation. EXAM: PORTABLE CHEST 1 VIEW COMPARISON:  Chest x-ray of earlier today. FINDINGS: The lungs are well-expanded. The interstitial markings are coarse. There is no pleural effusion, pneumothorax, or pneumomediastinum. The endotracheal tube tip projects at or just above the carina. The esophagogastric tube tip and proximal port project below the GE junction. The heart and pulmonary vascularity are normal. There is calcification in the wall of the aortic arch. IMPRESSION: Mild hyperinflation consistent with known reactive airway disease. No alveolar pneumonia. The endotracheal tube tip is at or just above the carina. Withdrawal by 2-3 cm is recommended. Reasonable positioning of the esophagogastric tube. Electronically Signed   By: David  [place] M.D.   On: 12/08/2017 09:27   Dg Chest Portable 1 View  Result Date: 12/08/2017 CLINICAL DATA:  Shortness of breath.  History of COPD. EXAM: PORTABLE CHEST 1 VIEW COMPARISON:  06/13/2017 FINDINGS: The cardiac silhouette is upper limits of normal in size. Aortic atherosclerosis is noted. There is peribronchial thickening and chronic appearing interstitial coarsening which are slightly more prominent than on the prior study. Minimal scarring or  atelectasis is present in the left lung base. No overt pulmonary edema, segmental consolidation, sizeable pleural effusion, or pneumothorax is identified. No acute  osseous abnormality is seen. IMPRESSION: Increased bronchitic changes. Electronically Signed   By: Sebastian Ache M.D.   On: 12/08/2017 08:12   Dg Abd Portable 1 View  Result Date: 12/08/2017 CLINICAL DATA:  77 year old female enteric tube placement. EXAM: PORTABLE ABDOMEN - 1 VIEW COMPARISON:  Portable chest 0727 hours today. FINDINGS: AP view at 0852 hours. An enteric tube has been placed and courses into the left abdomen. The tip is not identified, but the side hole may be visible at the level of the gastric body (arrow). Mild respiratory motion artifact at the lung bases were ventilation appears stable. Superimposed EKG leads. Paucity of bowel gas in the upper abdomen. IMPRESSION: Enteric tube courses into the stomach, side hole probably at the gastric body level. Electronically Signed   By: Odessa Fleming M.D.   On: 12/08/2017 09:27    EKG: Orders placed or performed during the hospital encounter of 12/08/17  . ED EKG  . ED EKG  . EKG 12-Lead  . EKG 12-Lead    IMPRESSION AND PLAN: *Acute on chronic hypercapnic hypoxic respiratory failure Secondary to acute asthma/COPD exacerbation Admit to ICU on our ICU protocol, case discussed with intensivist, aggressive pulmonary toilet with bronchodilator therapy, empiric Levaquin, IV fluids for rehydration, IV Solu-Medrol with taper as tolerated, repeat blood gas in an hour, follow-up on cultures, ventilator weaning as tolerated, chest x-ray in the morning, rule out acute coronary syndrome with cardiac enzymes x3 sets, aspiration/fall precautions, continue close medical monitoring  *Acute asthma/COPD exacerbation Plan of care as stated above  *Chronic benign essential hypertension Hold antihypertensives given low normal blood pressures status post intubation, IV fluids for rehydration, vitals per  routine, and make changes as per necessary  *Chronic obesity Most likely secondary to excess calories Lifestyle modification recommended    All the records are reviewed and case discussed with ED provider. Management plans discussed with the patient, family and they are in agreement.  CODE STATUS:full Code Status History    Date Active Date Inactive Code Status Order ID Comments User Context   06/13/2017 1722 06/17/2017 2152 Full Code [id number]  Shaune Pollack, MD Inpatient       TOTAL TIME TAKING CARE OF THIS PATIENT: 45 minutes.    Evelena Asa Salary M.D on 12/08/2017   Between 7am to 6pm - Pager - [phone]  After 6pm go to [email or web address] - password [employer]  [facility] [place] [facility]  Office  [phone]  CC: Primary care physician; Leotis Shames, MD   Note: This dictation was prepared with Dragon dictation along with smaller phrase technology. Any transcriptional errors that result from this process are unintentional.

## 2017-12-08 NOTE — Progress Notes (Signed)
*  PRELIMINARY RESULTS* Echocardiogram 2D Echocardiogram has been performed.  Joanette Gula Shalandra Leu 12/08/2017, 2:58 PM

## 2017-12-08 NOTE — Consult Note (Signed)
Name: Candace Butler MRN: [id number] DOB: Jul 30, 1941     CONSULTATION DATE: 12/08/2017   HISTORY OF PRESENT ILLNESS:   77 years old lady with history of chronic respiratory failure on home O2 3 L/min nasal cannula, COPD and hypertension.  Patient is admitted with acute on chronic respiratory failure, COPD exacerbation, did not respond to BiPAP and had to be intubated because of hypercarbic respiratory failure.  Blood pressure dropped after starting sedation responded to fluid resuscitation and patient did not require any pressors.  All history was obtained from PCP, nursing and EMR. Patient arrived to ICU on the ventilator, sedated with Versed and in no distress.  PAST MEDICAL HISTORY :   has a past medical history of Asthma, COPD (chronic obstructive pulmonary disease) (HCC), and Hypertension.  has a past surgical history that includes Colon surgery and Abdominal hysterectomy. Prior to Admission medications   Medication Sig Start Date End Date Taking? Authorizing Provider  ALPRAZolam Prudy Feeler) 0.5 MG tablet Take 0.25 tablets by mouth 2 (two) times daily as needed for anxiety.  05/23/17  Yes [provider]  enalapril-hydrochlorothiazide (VASERETIC) 10-25 MG tablet Take 1 tablet daily by mouth. 04/12/17  Yes [provider]  ADVAIR DISKUS 250-50 MCG/DOSE AEPB Take 1 puff 2 (two) times daily by mouth. 05/28/17   [provider]  albuterol (PROVENTIL HFA;VENTOLIN HFA) 108 (90 Base) MCG/ACT inhaler Inhale 2 puffs every 6 (six) hours as needed into the lungs for wheezing or shortness of breath. 06/16/17   Shaune Pollack, MD  tiotropium (SPIRIVA) 18 MCG inhalation capsule Place 1 capsule (18 mcg total) daily into inhaler and inhale. 06/18/17   Shaune Pollack, MD   Allergies  Allergen Reactions  . Indomethacin   . Vicodin [Hydrocodone-Acetaminophen]     FAMILY HISTORY:  family history is not on file. SOCIAL HISTORY:  reports that she has been smoking cigarettes.  She has never  used smokeless tobacco. She reports that she does not drink alcohol or use drugs.  REVIEW OF SYSTEMS:   Unable to obtain due to critical illness   VITAL SIGNS: Temp:  [97.8 F (36.6 C)-98.3 F (36.8 C)] 97.8 F (36.6 C) (05/02 1154) Pulse Rate:  [69-106] 77 (05/02 1115) Resp:  [16-30] 20 (05/02 1115) BP: (71-159)/(53-120) 101/69 (05/02 1154) SpO2:  [89 %-100 %] 94 % (05/02 1115) FiO2 (%):  [40 %] 40 % (05/02 1150) Weight:  [81.6 kg (180 lb)] 81.6 kg (180 lb) (05/02 0732)  Physical Examination:  Sedated with Versed RASS -2 On vent, no distress, bilateral equal air entry and no rales S1 & S2 audible and no murmur Benign abdomen with febrile peristalsis Bilateral leg edema 1+  ASSESSMENT / PLAN: Acute on chronic respiratory failure with base Nasal cannula 3 L/min.  Intubated because of hypercapnic respiratory failure did not improve with BiPAP -Monitor ABG, optimize ventilator settings and continue with vent support  Acute exacerbation of COPD -Bronchodilators, inhaled steroids, tapering systemic steroids and empiric levofloxacin.  Atelectasis and pneumonia.  Bibasilar airspace disease. -Empiric levofloxacin.  Monitor chest x-ray, CBC and FiO2 requirement  Hypotension due to medication improved after fluid resuscitation -Monitor hemodynamics  Hyponatremia and hypo-chloremia with intravascular volume depletion -Optimize volume with normal saline, monitor renal panel and consider renal consult if no improvement. -Serum osmolarity, urine osmolarity and random urine sodium.  Rule out SIADH.  Hypomagnesemia -Replete and monitor electrolytes  Full code  DVT and GI prophylaxis.  Continue with supportive care.  Critical care time 40 minutes

## 2017-12-08 NOTE — ED Notes (Addendum)
ED Provider at bedside updating family.  Offered family drink and verbal reassurance.

## 2017-12-08 NOTE — ED Notes (Signed)
Report to Stacey, RN.

## 2017-12-08 NOTE — ED Notes (Addendum)
Dr. Katheren Shams paged about patient most recent BP, verbal orders received.

## 2017-12-08 NOTE — Progress Notes (Signed)
Family Meeting Note  Advance Directive:yes  Today a meeting took place with the Patient, daughters.  Patient is unable to participate due ZO:[id number] capacity Comatose   The following clinical team members were present during this meeting:MD  The following were discussed:Patient's diagnosis: Acute on chronic hypoxic hypercapnic respiratory failure, asthma/COPD exacerbation, hypertension, hypotension, emergent intubation, Patient's progosis: Unable to determine and Goals for treatment: Full Code  Additional follow-up to be provided: prn  Time spent during discussion:20 minutes  Bertrum Sol, MD

## 2017-12-08 NOTE — ED Notes (Addendum)
ED Provider at bedside. 

## 2017-12-08 NOTE — ED Notes (Signed)
Dr. Salary at bedside.  

## 2017-12-08 NOTE — ED Notes (Signed)
Attempt to call report X 2. Charge RN aware

## 2017-12-08 NOTE — Progress Notes (Signed)
Ett  Placed at 22 cm.

## 2017-12-08 NOTE — Progress Notes (Signed)
Pharmacy Antibiotic Note  Yuritzy Zehring is a 77 y.o. female admitted on 12/08/2017 with AECOPD.  Pharmacy has been consulted for cefepime dosing. Patient is also ordered azithromycin and was previously ordered Levaquin.   Plan: Cefepime 2 g iv q 8 hours.   Weight: 180 lb (81.6 kg)  Temp (24hrs), Avg:98.1 F (36.7 C), Min:97.8 F (36.6 C), Max:98.3 F (36.8 C)  Recent Labs  Lab 12/08/17 0740  WBC 8.4  CREATININE 0.50    Estimated Creatinine Clearance: 55.2 mL/min (by C-G formula based on SCr of 0.5 mg/dL).    Allergies  Allergen Reactions  . Indomethacin   . Vicodin [Hydrocodone-Acetaminophen]     Antimicrobials this admission: Levaquin 5/2 x 1 cefepime 5/2 >>  Azithromycin 5/2 >>  Dose adjustments this admission:   Microbiology results: 5/2 MRSA PCR: negative  Thank you for allowing pharmacy to be a part of this patient's care.  Luisa Hart D 12/08/2017 2:52 PM

## 2017-12-08 NOTE — Care Management (Signed)
Patient received home health services through [facility] which was closed out Jan. 10, 2019.

## 2017-12-08 NOTE — ED Notes (Signed)
ED Provider at bedside to reassess. EDP aware of patient lowering blood pressure.

## 2017-12-08 NOTE — ED Notes (Signed)
Date and time results received: 12/08/17 0830  Sodium 119 verbal Dr. Rada Hay

## 2017-12-08 NOTE — ED Notes (Signed)
Taken to floor by [place], Charity fundraiser and Amy, RT. Family took patient belongings.

## 2017-12-08 NOTE — ED Notes (Addendum)
In patient's room suctioning with yaunker-clear secretions, oxygen began dropping to 70's and continued down to 40's while this RN began to bag patient with ambu bag.EDP paged to bedside. RT paged. Pt continued to be bagged by this RN. Oxygen back up to 100% at this time. RT at bedside.

## 2017-12-08 NOTE — ED Notes (Signed)
RT at bedside. Pt placed on BIPAP

## 2017-12-08 NOTE — ED Notes (Signed)
Family at bedside. 

## 2017-12-08 NOTE — ED Notes (Signed)
EDP informed of patient lower blood pressure. Orders received.

## 2017-12-08 NOTE — ED Notes (Addendum)
Intubation 7.5, 23 at lip, positive color change. Bilateral breath sounds. Xray order placed.

## 2017-12-08 NOTE — Progress Notes (Signed)
Pt was transported to CCU for the ED while on the vent.

## 2017-12-08 NOTE — ED Notes (Addendum)
Attempt to call report, unsuccessful.  

## 2017-12-08 NOTE — ED Provider Notes (Signed)
[facility] Emergency Department Provider Note ____________________________________________   First MD Initiated Contact with Patient 12/08/17 0730     (approximate)  I have reviewed the triage vital signs and the nursing notes.   HISTORY  Chief Complaint Respiratory Distress  Level 5 caveat: Unable to obtain complete history due to respiratory distress  HPI Candace Butler is a 77 y.o. female with history of COPD and hypertension who presents with shortness of breath for the last 5 days.  Per EMS, the patient's daughter found her in acute distress today.  On EMS arrival she appeared gray and had an O2 sat in the 70s.  She was unable to hold up her own nebulizer treatment.  She is on 3 L oxygen at baseline.  Past Medical History:  Diagnosis Date  . Asthma   . COPD (chronic obstructive pulmonary disease) (HCC)   . Hypertension     Patient Active Problem List   Diagnosis Date Noted  . Respiratory failure (HCC) 12/08/2017  . COPD exacerbation (HCC) 06/13/2017    Past Surgical History:  Procedure Laterality Date  . ABDOMINAL HYSTERECTOMY    . COLON SURGERY      Prior to Admission medications   Medication Sig Start Date End Date Taking? Authorizing Provider  ALPRAZolam Prudy Feeler) 0.5 MG tablet Take 0.25 tablets by mouth 2 (two) times daily as needed for anxiety.  05/23/17  Yes [provider]  enalapril-hydrochlorothiazide (VASERETIC) 10-25 MG tablet Take 1 tablet daily by mouth. 04/12/17  Yes [provider]  ADVAIR DISKUS 250-50 MCG/DOSE AEPB Take 1 puff 2 (two) times daily by mouth. 05/28/17   [provider]  albuterol (PROVENTIL HFA;VENTOLIN HFA) 108 (90 Base) MCG/ACT inhaler Inhale 2 puffs every 6 (six) hours as needed into the lungs for wheezing or shortness of breath. 06/16/17   Shaune Pollack, MD  tiotropium (SPIRIVA) 18 MCG inhalation capsule Place 1 capsule (18 mcg total) daily into inhaler and inhale. 06/18/17   Shaune Pollack, MD     Allergies Indomethacin and Vicodin [hydrocodone-acetaminophen]  No family history on file.  Social History Social History   Tobacco Use  . Smoking status: Current Every Day Smoker    Types: Cigarettes  . Smokeless tobacco: Never Used  Substance Use Topics  . Alcohol use: No    Frequency: Never  . Drug use: No    Review of Systems Level 5 caveat: Unable to obtain review of systems due to respiratory distress    ____________________________________________   PHYSICAL EXAM:  VITAL SIGNS: ED Triage Vitals [12/08/17 0732]  Enc Vitals Group     BP (!) 159/120     Pulse Rate (!) 106     Resp (!) 30     Temp 98.3 F (36.8 C)     Temp Source Oral     SpO2 90 %     Weight 180 lb (81.6 kg)     Height      Head Circumference      Peak Flow      Pain Score      Pain Loc      Pain Edu?      Excl. in GC?     Constitutional: Arousable but tired appearing, sitting with eyes closed. Eyes: Conjunctivae are normal.  Head: Atraumatic. Nose: No congestion/rhinnorhea. Mouth/Throat: Mucous membranes are slightly dry.   Neck: Normal range of motion.  Cardiovascular: Tachycardic, regular rhythm. Grossly normal heart sounds.  Good peripheral circulation. Respiratory: Increased respiratory effort and accessory  muscle use.  Bilateral decreased breath sounds, poor air entry, and wheezing. Gastrointestinal:  No distention.  Genitourinary: No CVA tenderness. Musculoskeletal: No lower extremity edema.  Extremities warm and well perfused.  Neurologic: Motor intact in all extremities.  No gross focal neurologic deficits are appreciated.  Skin:  Skin is warm and dry. No rash noted. Psychiatric: Anxious appearing, cooperative.  ____________________________________________   LABS (all labs ordered are listed, but only abnormal results are displayed)  Labs Reviewed  CBC WITH DIFFERENTIAL/PLATELET - Abnormal; Notable for the following components:      Result Value   Neutro Abs  7.1 (*)    Lymphs Abs 0.6 (*)    All other components within normal limits  BRAIN NATRIURETIC PEPTIDE - Abnormal; Notable for the following components:   B Natriuretic Peptide 321.0 (*)    All other components within normal limits  COMPREHENSIVE METABOLIC PANEL - Abnormal; Notable for the following components:   Sodium 119 (*)    Chloride 67 (*)    CO2 44 (*)    Glucose, Bld 161 (*)    Calcium 8.6 (*)    All other components within normal limits  URINALYSIS, COMPLETE (UACMP) WITH MICROSCOPIC - Abnormal; Notable for the following components:   Color, Urine YELLOW (*)    APPearance HAZY (*)    Ketones, ur 5 (*)    Protein, ur >=300 (*)    All other components within normal limits  BLOOD GAS, VENOUS - Abnormal; Notable for the following components:   pCO2, Ven 117 (*)    pO2, Ven 46.0 (*)    Bicarbonate 53.7 (*)    Acid-Base Excess 20.6 (*)    All other components within normal limits  MAGNESIUM - Abnormal; Notable for the following components:   Magnesium 1.5 (*)    All other components within normal limits  TROPONIN I   ____________________________________________  EKG  ED ECG REPORT I, Dionne Bucy, the attending physician, personally viewed and interpreted this ECG.  Date: 12/08/2017 EKG Time: 734 Rate: 106 Rhythm: Sinus tachycardia QRS Axis: normal Intervals: normal ST/T Wave abnormalities: Nonspecific ST abnormalities inferior Narrative Interpretation: no evidence of acute ischemia  ____________________________________________  RADIOLOGY  CXR: Bronchitic changes; no focal infiltrate  ____________________________________________   PROCEDURES  Procedure(s) performed: Yes  Procedure Name: Intubation Date/Time: 12/08/2017 10:59 AM Performed by: Dionne Bucy, MD Preoxygenation: Pre-oxygenation with 100% oxygen Induction Type: Rapid sequence Laryngoscope Size: Glidescope and 3 Number of attempts: 1 Airway Equipment and Method:  Video-laryngoscopy Placement Confirmation: ETT inserted through vocal cords under direct vision,  Positive ETCO2 and Breath sounds checked- equal and bilateral Tube secured with: ETT holder Comments: Patient tolerated the procedure with no immediate complications.       Critical Care performed: Yes  CRITICAL CARE Performed by: Dionne Bucy   Total critical care time: 65 minutes  Critical care time was exclusive of separately billable procedures and treating other patients.  Critical care was necessary to treat or prevent imminent or life-threatening deterioration.  Critical care was time spent personally by me on the following activities: development of treatment plan with patient and/or surrogate as well as nursing, discussions with consultants, evaluation of patient's response to treatment, examination of patient, obtaining history from patient or surrogate, ordering and performing treatments and interventions, ordering and review of laboratory studies, ordering and review of radiographic studies, pulse oximetry and re-evaluation of patient's condition. ____________________________________________   INITIAL IMPRESSION / ASSESSMENT AND PLAN / ED COURSE  Pertinent labs & imaging results that  were available during my care of the patient were reviewed by me and considered in my medical decision making (see chart for details).  77 year old female with PMH as noted above including history of COPD on home O2 presents with apparent respiratory failure.  Patient has had shortness of breath for last several days, but appeared acutely worse to her family member today.  Per EMS, the patient had an O2 sat in the 70s, was too tired to hold up her own nebulizer treatment, and appeared pale and gray.  I reviewed the past medical records in Epic; the patient was most recently admitted to the hospital in November of last year for COPD exacerbation.  On exam in the ED, she is tachycardic and  hypotensive.  O2 sat is in the 80s to low 90s on nasal cannula, and she is in moderate respiratory distress with wheezing bilaterally.  She appears tired and is sitting with eyes closed, but is arousable to voice and answering some questions.  Presentation is consistent with COPD exacerbation, with respiratory failure and likely hypercapnia.  Will place the patient on BiPAP, give steroid, nebulizers, and magnesium, and reassess.  Differential also includes pneumonia, acute bronchitis, or less likely cardiac etiology.  Will obtain work-up for these causes as well.    ----------------------------------------- 10:57 AM on 12/08/2017 -----------------------------------------  X-ray confirmed bronchitic changes but no focal infiltrate.  On reassessment, patient's blood pressure was in the 70s over 50s, and she appeared significantly more somnolent.  VBG revealed CO2 of 117.  Based on these findings, the patient appeared to be in worsening respiratory failure despite the BiPAP.  I proceeded with intubation, which was successful on the first attempt.  Patient is now stable on the vent.  Blood pressure improved and has been fluctuating around 80s to 100s systolic.  Patient appears to have good perfusion.  I was called into the room again due to the patient having an episode of desaturation while on the vent.  Despite being on the ventilator, O2 sat dropped briefly to the 40s for a few moments while the RN was in the room with the patient.  We disconnected the ventilator and ventilated by BVM with immediate response.  I suspect the patient may have been breath stacking and the vent settings were changed.  Patient remains relatively stable at this time.  I signed the patient out to the hospitalist Dr. Katheren Shams for admission to the ICU.   ____________________________________________   FINAL CLINICAL IMPRESSION(S) / ED DIAGNOSES  Final diagnoses:  COPD exacerbation (HCC)  Acute respiratory failure with  hypoxia and hypercapnia (HCC)      NEW MEDICATIONS STARTED DURING THIS VISIT:  New Prescriptions   No medications on file     Note:  This document was prepared using Dragon voice recognition software and may include unintentional dictation errors.    Dionne Bucy, MD 12/08/17 1101

## 2017-12-09 ENCOUNTER — Inpatient Hospital Stay: Payer: Medicare HMO

## 2017-12-09 DIAGNOSIS — J9621 Acute and chronic respiratory failure with hypoxia: Principal | ICD-10-CM

## 2017-12-09 LAB — BLOOD GAS, ARTERIAL
ACID-BASE EXCESS: 13.4 mmol/L — AB (ref 0.0–2.0)
Acid-Base Excess: 15.7 mmol/L — ABNORMAL HIGH (ref 0.0–2.0)
Acid-Base Excess: 12.3 mmol/L — ABNORMAL HIGH (ref 0.0–2.0)
Bicarbonate: 43.7 mmol/L — ABNORMAL HIGH (ref 20.0–28.0)
Bicarbonate: 42.5 mmol/L — ABNORMAL HIGH (ref 20.0–28.0)
Bicarbonate: 41 mmol/L — ABNORMAL HIGH (ref 20.0–28.0)
FIO2: 0.4
FIO2: 0.4
FIO2: 0.4
MECHVT: 450 mL
MECHVT: 500 mL
O2 SAT: 93.7 %
O2 SAT: 94 %
O2 Saturation: 95.9 %
PATIENT TEMPERATURE: 37
PATIENT TEMPERATURE: 37
PCO2 ART: 93 mmHg — AB (ref 32.0–48.0)
PCO2 ART: 64 mmHg — AB (ref 32.0–48.0)
PEEP/CPAP: 5 cmH2O
PEEP: 5 cmH2O
PEEP: 5 cmH2O
PH ART: 7.28 — AB (ref 7.350–7.450)
PO2 ART: 78 mmHg — AB (ref 83.0–108.0)
PO2 ART: 79 mmHg — AB (ref 83.0–108.0)
PO2 ART: 75 mmHg — AB (ref 83.0–108.0)
Patient temperature: 37
Pressure support: 5 cmH2O
RATE: 20 resp/min
RATE: 22 resp/min
pCO2 arterial: 76 mmHg (ref 32.0–48.0)
pH, Arterial: 7.43 (ref 7.350–7.450)
pH, Arterial: 7.34 — ABNORMAL LOW (ref 7.350–7.450)

## 2017-12-09 LAB — BASIC METABOLIC PANEL
ANION GAP: 7 (ref 5–15)
BUN: 19 mg/dL (ref 6–20)
CALCIUM: 8.2 mg/dL — AB (ref 8.9–10.3)
CO2: 37 mmol/L — AB (ref 22–32)
CREATININE: 0.62 mg/dL (ref 0.44–1.00)
Chloride: 84 mmol/L — ABNORMAL LOW (ref 101–111)
GFR calc Af Amer: >60 mL/min (ref >60)
GLUCOSE: 125 mg/dL — AB (ref 65–99)
Potassium: 3.9 mmol/L (ref 3.5–5.1)
Sodium: 128 mmol/L — ABNORMAL LOW (ref 135–145)

## 2017-12-09 LAB — CBC WITH DIFFERENTIAL/PLATELET
BASOS PCT: 0 %
Basophils Absolute: 0 10*3/uL (ref 0–0.1)
EOS ABS: 0 10*3/uL (ref 0–0.7)
Eosinophils Relative: 0 %
HEMATOCRIT: 31.5 % — AB (ref 35.0–47.0)
HEMOGLOBIN: 10.9 g/dL — AB (ref 12.0–16.0)
LYMPHS ABS: 0.5 10*3/uL — AB (ref 1.0–3.6)
Lymphocytes Relative: 6 %
MCH: 31.9 pg (ref 26.0–34.0)
MCHC: 34.8 g/dL (ref 32.0–36.0)
MCV: 91.7 fL (ref 80.0–100.0)
MONOS PCT: 4 %
Monocytes Absolute: 0.3 10*3/uL (ref 0.2–0.9)
NEUTROS ABS: 7.6 10*3/uL — AB (ref 1.4–6.5)
NEUTROS PCT: 90 %
Platelets: 312 10*3/uL (ref 150–440)
RBC: 3.43 MIL/uL — AB (ref 3.80–5.20)
RDW: 13.4 % (ref 11.5–14.5)
WBC: 8.3 10*3/uL (ref 3.6–11.0)

## 2017-12-09 LAB — TROPONIN I: Troponin I: <0.03 ng/mL (ref <0.03)

## 2017-12-09 LAB — PROCALCITONIN: Procalcitonin: <0.1 ng/mL

## 2017-12-09 LAB — MAGNESIUM: Magnesium: 1.9 mg/dL (ref 1.7–2.4)

## 2017-12-09 LAB — PHOSPHORUS: Phosphorus: 1.5 mg/dL — ABNORMAL LOW (ref 2.5–4.6)

## 2017-12-09 MED ORDER — METHYLPREDNISOLONE SODIUM SUCC 125 MG IJ SOLR
60.0000 mg | Freq: Three times a day (TID) | INTRAMUSCULAR | Status: DC
Start: 2017-12-09 — End: 2017-12-10
  Administered 2017-12-09 – 2017-12-10 (×4): 60 mg via INTRAVENOUS
  Filled 2017-12-09 (×4): qty 2

## 2017-12-09 MED ORDER — ORAL CARE MOUTH RINSE
15.0000 mL | OROMUCOSAL | Status: DC
  Administered 2017-12-09 – 2017-12-13 (×14): 15 mL via OROMUCOSAL

## 2017-12-09 MED ORDER — ALPRAZOLAM 0.5 MG PO TABS
0.5000 mg | ORAL_TABLET | Freq: Two times a day (BID) | ORAL | Status: DC
  Administered 2017-12-09 – 2017-12-13 (×8): 0.5 mg via ORAL
  Filled 2017-12-09 (×9): qty 1

## 2017-12-09 MED ORDER — SODIUM GLYCEROPHOSPHATE 1 MMOLE/ML IV SOLN
20.0000 mmol | Freq: Once | INTRAVENOUS | Status: AC
  Administered 2017-12-09: 20 mmol via INTRAVENOUS
  Filled 2017-12-09: qty 20

## 2017-12-09 MED ORDER — ASPIRIN EC 81 MG PO TBEC
81.0000 mg | DELAYED_RELEASE_TABLET | Freq: Every day | ORAL | Status: DC
  Administered 2017-12-09 – 2017-12-19 (×11): 81 mg via ORAL
  Filled 2017-12-09 (×11): qty 1

## 2017-12-09 MED ORDER — SODIUM CHLORIDE 0.9 % IV SOLN
1.0000 g | Freq: Three times a day (TID) | INTRAVENOUS | Status: AC
  Administered 2017-12-09 – 2017-12-12 (×10): 1 g via INTRAVENOUS
  Filled 2017-12-09 (×11): qty 1

## 2017-12-09 NOTE — Progress Notes (Signed)
Pharmacy Antibiotic Note  Candace Butler is a 77 y.o. female admitted on 12/08/2017 with AECOPD.  Pharmacy has been consulted for cefepime dosing. Patient is also ordered azithromycin and was previously ordered Levaquin.   Plan: Cefepime 2 g iv q 8 hours.   Height:  (162.6 cm) Weight: 185 lb 10 oz (84.2 kg) IBW/kg (Calculated) : 54.7  Temp (24hrs), Avg:98.8 F (37.1 C), Min:98 F (36.7 C), Max:99.2 F (37.3 C)  Recent Labs  Lab 12/08/17 0740 12/09/17 0547  WBC 8.4 8.3  CREATININE 0.50 0.62    Estimated Creatinine Clearance: 59.9 mL/min (by C-G formula based on SCr of 0.62 mg/dL).    Allergies  Allergen Reactions  . Indomethacin   . Vicodin [Hydrocodone-Acetaminophen]     Antimicrobials this admission: Levaquin 5/2 x 1 cefepime 5/2 >>  Azithromycin 5/2 >>  Dose adjustments this admission:   Microbiology results: 5/2 MRSA PCR: negative  Thank you for allowing pharmacy to be a part of this patient's care.  Ashaki Frosch L 12/09/2017 10:27 PM

## 2017-12-09 NOTE — Progress Notes (Signed)
Initial Nutrition Assessment  DOCUMENTATION CODES:   Not applicable  INTERVENTION:  If unable to extubate within 24-48 hours, recommend permitted hypocaloric feedings with Vital High Protein at 62mL/hr, Pro-stat 30mL daily via OGT  Provides 1180 calories, 110 grams of protein, free water  Continue to replete Phosphorus (currently ordered for Glycophos )  NUTRITION DIAGNOSIS:   Inadequate oral intake related to inability to eat as evidenced by NPO status.  GOAL:   Provide needs based on ASPEN/SCCM guidelines  MONITOR:   Diet advancement, Vent status  REASON FOR ASSESSMENT:   Ventilator    ASSESSMENT:   77 years old lady with history of chronic respiratory failure on home O2 3 L/min nasal cannula, COPD and hypertension.  Patient is admitted with acute on chronic respiratory failure, COPD exacerbation, did not respond to BiPAP and had to be intubated because of hypercarbic respiratory failure.   Patient is currently intubated on ventilator support MV: 10.9 L/min Temp (24hrs), Avg:99.3 F (37.4 C), Min:98 F (36.7 C), Max:101.3 F (38.5 C)  Spoke with family at bedside. According to daughters patient was "living off of bars," for the past few weeks. They state she was very hungry but felt food was getting stuck in her upper chest with shortness of breath.  They state she would normally wake up at 2am and eat a nutrigrain bar, then eat another protein bar or a pack of nabs at 7am. Later on will eat frozen sausage and gravy biscuits or a frozen sausage, egg, and cheese biscuit, and eat a sandwich or another protein bar after that.   She does not normally eat after 4pm. Family states this has only been going on for the past few weeks, was eating normally before that. They deny any weight loss.  Discussed in rounds, patient is alert, possible extubation today, RD will follow for needs.  MAP: 73-96  Labs reviewed:  Na 128, Phos 1.5  Medications reviewed and  include:  Solumedrol, Sodium Glycerophosphate NS at 35mL/hr Fentanyl gtt, Versed gtt   NUTRITION - FOCUSED PHYSICAL EXAM:    Most Recent Value  Orbital Region  No depletion  Upper Arm Region  No depletion  Thoracic and Lumbar Region  No depletion  Buccal Region  Unable to assess  Temple Region  Unable to assess  Clavicle Bone Region  No depletion  Clavicle and Acromion Bone Region  No depletion  Scapular Bone Region  Unable to assess  Dorsal Hand  No depletion  Patellar Region  No depletion  Anterior Thigh Region  No depletion  Posterior Calf Region  No depletion       Diet Order:   Diet Order           Diet NPO time specified  Diet effective now          EDUCATION NEEDS:   Not appropriate for education at this time  Skin:  Skin Assessment: Reviewed RN Assessment  Last BM:  PTA  Height:   Ht Readings from Last 1 Encounters:  12/08/17  (1.626 m)    Weight:   Wt Readings from Last 1 Encounters:  12/09/17 185 lb 10 oz (84.2 kg)    Ideal Body Weight:  54.54 kg  BMI:  Body mass index is 31.86 kg/m.  Estimated Nutritional Needs:   Kcal:  [phone] calories (ABW x11-14)  Protein:  >109 grams (2g/kg IBW)  Fluid:  >1.5L  Dionne Ano. Atara Paterson, MS, RD LDN Inpatient Clinical Dietitian Pager [phone]

## 2017-12-09 NOTE — Progress Notes (Signed)
[facility] - [place] [facility]   PATIENT NAME: Candace Butler    MR#:  [id number]  DATE OF BIRTH:  Jan 13, 1941  SUBJECTIVE:  CHIEF COMPLAINT:   Chief Complaint  Patient presents with  . Respiratory Distress  Patient remains critically ill, on ventilator, family at the bedside, case discussed with intensivist-cardiology to see given possible ischemia on EKG, echocardiogram noted  REVIEW OF SYSTEMS:  CONSTITUTIONAL: No fever, fatigue or weakness.  EYES: No blurred or double vision.  EARS, NOSE, AND THROAT: No tinnitus or ear pain.  RESPIRATORY: No cough, shortness of breath, wheezing or hemoptysis.  CARDIOVASCULAR: No chest pain, orthopnea, edema.  GASTROINTESTINAL: No nausea, vomiting, diarrhea or abdominal pain.  GENITOURINARY: No dysuria, hematuria.  ENDOCRINE: No polyuria, nocturia,  HEMATOLOGY: No anemia, easy bruising or bleeding SKIN: No rash or lesion. MUSCULOSKELETAL: No joint pain or arthritis.   NEUROLOGIC: No tingling, numbness, weakness.  PSYCHIATRY: No anxiety or depression.   ROS  DRUG ALLERGIES:   Allergies  Allergen Reactions  . Indomethacin   . Vicodin [Hydrocodone-Acetaminophen]     VITALS:  Blood pressure 134/77, pulse 92, temperature 99.1 F (37.3 C), resp. rate 12, height  (1.626 m), weight 84.2 kg (185 lb 10 oz), SpO2 94 %.  PHYSICAL EXAMINATION:  GENERAL:  77 y.o.-year-old patient lying in the bed with no acute distress.  EYES: Pupils equal, round, reactive to light and accommodation. No scleral icterus. Extraocular muscles intact.  HEENT: Head atraumatic, normocephalic. Oropharynx and nasopharynx clear.  NECK:  Supple, no jugular venous distention. No thyroid enlargement, no tenderness.  LUNGS: Normal breath sounds bilaterally, no wheezing, rales,rhonchi or crepitation. No use of accessory muscles of respiration.  CARDIOVASCULAR: S1, S2 normal. No murmurs, rubs, or gallops.  ABDOMEN: Soft, nontender, nondistended. Bowel  sounds present. No organomegaly or mass.  EXTREMITIES: No pedal edema, cyanosis, or clubbing.  NEUROLOGIC: Cranial nerves II through XII are intact. Muscle strength 5/5 in all extremities. Sensation intact. Gait not checked.  PSYCHIATRIC: The patient is alert and oriented x 3.  SKIN: No obvious rash, lesion, or ulcer.   Physical Exam LABORATORY PANEL:   CBC Recent Labs  Lab 12/09/17 0547  WBC 8.3  HGB 10.9*  HCT 31.5*  PLT 312   ------------------------------------------------------------------------------------------------------------------  Chemistries  Recent Labs  Lab 12/08/17 0740 12/09/17 0547  NA 119* 128*  K 3.6 3.9  CL 67* 84*  CO2 44* 37*  GLUCOSE 161* 125*  BUN 17 19  CREATININE 0.50 0.62  CALCIUM 8.6* 8.2*  MG 1.5* 1.9  AST 36  --   ALT 25  --   ALKPHOS 58  --   BILITOT 0.6  --    ------------------------------------------------------------------------------------------------------------------  Cardiac Enzymes Recent Labs  Lab 12/08/17 1747 12/08/17 2344  TROPONINI 0.15* <0.03   ------------------------------------------------------------------------------------------------------------------  RADIOLOGY:  US Venous Img Lower Bilateral  Result Date: 12/08/2017 CLINICAL DATA:  77 year old female with bilateral lower extremity swelling for the past 3-4 days and shortness of breath EXAM: BILATERAL LOWER EXTREMITY VENOUS DOPPLER ULTRASOUND TECHNIQUE: Gray-scale sonography with graded compression, as well as color Doppler and duplex ultrasound were performed to evaluate the lower extremity deep venous systems from the level of the common femoral vein and including the common femoral, femoral, profunda femoral, popliteal and calf veins including the posterior tibial, peroneal and gastrocnemius veins when visible. The superficial great saphenous vein was also interrogated. Spectral Doppler was utilized to evaluate flow at rest and with distal augmentation  maneuvers in the common femoral,  femoral and popliteal veins. COMPARISON:  None. FINDINGS: RIGHT LOWER EXTREMITY Common Femoral Vein: No evidence of thrombus. Normal compressibility, respiratory phasicity and response to augmentation. Saphenofemoral Junction: No evidence of thrombus. Normal compressibility and flow on color Doppler imaging. Profunda Femoral Vein: No evidence of thrombus. Normal compressibility and flow on color Doppler imaging. Femoral Vein: No evidence of thrombus. Normal compressibility, respiratory phasicity and response to augmentation. Popliteal Vein: No evidence of thrombus. Normal compressibility, respiratory phasicity and response to augmentation. Calf Veins: No evidence of thrombus. Normal compressibility and flow on color Doppler imaging. Superficial Great Saphenous Vein: No evidence of thrombus. Normal compressibility. Venous Reflux:  None. Other Findings:  None. LEFT LOWER EXTREMITY Common Femoral Vein: No evidence of thrombus. Normal compressibility, respiratory phasicity and response to augmentation. Saphenofemoral Junction: No evidence of thrombus. Normal compressibility and flow on color Doppler imaging. Profunda Femoral Vein: No evidence of thrombus. Normal compressibility and flow on color Doppler imaging. Femoral Vein: No evidence of thrombus. Normal compressibility, respiratory phasicity and response to augmentation. Popliteal Vein: No evidence of thrombus. Normal compressibility, respiratory phasicity and response to augmentation. Calf Veins: No evidence of thrombus. Normal compressibility and flow on color Doppler imaging. Superficial Great Saphenous Vein: No evidence of thrombus. Normal compressibility. Venous Reflux:  None. Other Findings:  None. IMPRESSION: No evidence of deep venous thrombosis. Electronically Signed   By: Malachy Moan M.D.   On: 12/08/2017 15:56   Dg Chest Port 1 View  Result Date: 12/09/2017 CLINICAL DATA:  Pneumonia EXAM: PORTABLE CHEST 1 VIEW  COMPARISON:  12/08/2017, 06/13/2017 FINDINGS: Endotracheal tube tip is about 2.7 cm superior to the carina. Esophageal tube tip is below the diaphragm but not included on the image. Stable cardiomediastinal silhouette with aortic atherosclerosis. Hazy atelectasis versus minimal infiltrate at the left base. No pneumothorax. Stable chronic interstitial opacity. IMPRESSION: 1. Endotracheal tube tip about 2.7 cm superior to the carina 2. Hazy left base atelectasis versus minimal infiltrate Electronically Signed   By: Jasmine Pang M.D.   On: 12/09/2017 02:55   Dg Chest Portable 1 View  Result Date: 12/08/2017 CLINICAL DATA:  Asthma-COPD.  Recent intubation. EXAM: PORTABLE CHEST 1 VIEW COMPARISON:  Chest x-ray of earlier today. FINDINGS: The lungs are well-expanded. The interstitial markings are coarse. There is no pleural effusion, pneumothorax, or pneumomediastinum. The endotracheal tube tip projects at or just above the carina. The esophagogastric tube tip and proximal port project below the GE junction. The heart and pulmonary vascularity are normal. There is calcification in the wall of the aortic arch. IMPRESSION: Mild hyperinflation consistent with known reactive airway disease. No alveolar pneumonia. The endotracheal tube tip is at or just above the carina. Withdrawal by 2-3 cm is recommended. Reasonable positioning of the esophagogastric tube. Electronically Signed   By: David  [place] M.D.   On: 12/08/2017 09:27   Dg Chest Portable 1 View  Result Date: 12/08/2017 CLINICAL DATA:  Shortness of breath.  History of COPD. EXAM: PORTABLE CHEST 1 VIEW COMPARISON:  06/13/2017 FINDINGS: The cardiac silhouette is upper limits of normal in size. Aortic atherosclerosis is noted. There is peribronchial thickening and chronic appearing interstitial coarsening which are slightly more prominent than on the prior study. Minimal scarring or atelectasis is present in the left lung base. No overt pulmonary edema, segmental  consolidation, sizeable pleural effusion, or pneumothorax is identified. No acute osseous abnormality is seen. IMPRESSION: Increased bronchitic changes. Electronically Signed   By: Sebastian Ache M.D.   On: 12/08/2017 08:12   Dg  Abd Portable 1 View  Result Date: 12/08/2017 CLINICAL DATA:  77 year old female enteric tube placement. EXAM: PORTABLE ABDOMEN - 1 VIEW COMPARISON:  Portable chest 0727 hours today. FINDINGS: AP view at 0852 hours. An enteric tube has been placed and courses into the left abdomen. The tip is not identified, but the side hole may be visible at the level of the gastric body (arrow). Mild respiratory motion artifact at the lung bases were ventilation appears stable. Superimposed EKG leads. Paucity of bowel gas in the upper abdomen. IMPRESSION: Enteric tube courses into the stomach, side hole probably at the gastric body level. Electronically Signed   By: Odessa Fleming M.D.   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 Stable Secondary to acute asthma/COPD exacerbation Continue ICU protocol, ventilator weaning as tolerated, empiric cefepime/azithromycin, aggressive pulmonary toilet with bronchodilator therapy, follow-up on cultures onary toilet with bronchodilator therapy, empiric Levaquin, IVFs for rehydration, IV Solu-Medrol with tapering as tolerated, CEs if consistent with ACS, and continue close medical monitoring Echocardiogram noted for stage I diastolic dysfunction  *Acute asthma/COPD exacerbation Stable Plan of care as stated above  *Chronic benign essential hypertension Stable on current regiment  *Chronic obesity Most likely secondary to excess calories Lifestyle modification recommended  *Acute hyponatremia ?  SIADH Nephrology to see    All the records are reviewed and case discussed with Care Management/Social Workerr. Management plans discussed with the patient, family and they are in  agreement.  CODE STATUS:full  TOTAL TIME TAKING CARE OF THIS PATIENT: 35 minutes.     POSSIBLE D/C IN 3-5 DAYS, DEPENDING ON CLINICAL CONDITION.   Evelena Asa Boyde Grieco M.D on 12/09/2017   Between 7am to 6pm - Pager - [phone]  After 6pm go to [email or web address] - password [email or web address]  [facility]  Office  [phone]  CC: Primary care physician; Leotis Shames, MD  Note: This dictation was prepared with Dragon dictation along with smaller phrase technology. Any transcriptional errors that result from this process are unintentional.

## 2017-12-09 NOTE — Progress Notes (Signed)
Name: Candace Butler MRN: [id number] DOB: [birth year]     CONSULTATION DATE: 12/08/2017  Subjective & Objective: She remains on vent, sedated, improved leg edema and no major issues last night.  PAST MEDICAL HISTORY :   has a past medical history of Asthma, COPD (chronic obstructive pulmonary disease) (HCC), and Hypertension.  has a past surgical history that includes Colon surgery and Abdominal hysterectomy. Prior to Admission medications   Medication Sig Start Date End Date Taking? Authorizing Provider  ALPRAZolam Prudy Feeler) 0.5 MG tablet Take 0.25 tablets by mouth 2 (two) times daily as needed for anxiety.  05/23/17  Yes [provider]  enalapril-hydrochlorothiazide (VASERETIC) 10-25 MG tablet Take 1 tablet daily by mouth. 04/12/17  Yes [provider]  ADVAIR DISKUS 250-50 MCG/DOSE AEPB Take 1 puff 2 (two) times daily by mouth. 05/28/17   [provider]  albuterol (PROVENTIL HFA;VENTOLIN HFA) 108 (90 Base) MCG/ACT inhaler Inhale 2 puffs every 6 (six) hours as needed into the lungs for wheezing or shortness of breath. 06/16/17   Shaune Pollack, MD  tiotropium (SPIRIVA) 18 MCG inhalation capsule Place 1 capsule (18 mcg total) daily into inhaler and inhale. 06/18/17   Shaune Pollack, MD   Allergies  Allergen Reactions  . Indomethacin   . Vicodin [Hydrocodone-Acetaminophen]     FAMILY HISTORY:  family history is not on file. SOCIAL HISTORY:  reports that she has been smoking cigarettes.  She has never used smokeless tobacco. She reports that she does not drink alcohol or use drugs.  REVIEW OF SYSTEMS:   Unable to obtain due to critical illness   VITAL SIGNS: Temp:  [97.8 F (36.6 C)-101.3 F (38.5 C)] 98.9 F (37.2 C) (05/03 0400) Pulse Rate:  [69-98] 80 (05/03 0600) Resp:  [16-22] 22 (05/03 0600) BP: (71-140)/(47-113) 132/83 (05/03 0600) SpO2:  [89 %-100 %] 95 % (05/03 0824) FiO2 (%):  [40 %] 40 % (05/03 0824) Weight:  [84.2 kg (185 lb 10 oz)-84.4 kg (186 lb  1.1 oz)] 84.2 kg (185 lb 10 oz) (05/03 0500)  Physical Examination:  Sedated with Versed RASS -2. Following simple commands On vent, no distress, bilateral equal air entry and no rales S1 & S2 audible and no murmur Benign abdomen with febrile peristalsis No leg edema  ASSESSMENT / PLAN:  Acute on chronic respiratory failure with base Nasal cannula 3 L/min.  Intubated because of hypercapnic respiratory failure did not improve with BiPAP -Monitor ABG, optimize ventilator settings and continue with vent support -Anxious when off sedation. Optimize Xanax (home med).  Acute exacerbation of COPD -Bronchodilators, inhaled steroids, tapering systemic steroids and empiric Cefp + Zithro. D/c Levoflox..  Atelectasis and pneumonia.  Bibasilar airspace disease. -Cefep + Zithromax. MRSA PCR -ve. Monitor chest x-ray, CBC and FiO2 requirement  Hypotension due to medication improved after fluid resuscitation -Monitor hemodynamics  Hyponatremia and hypo-chloremia with intravascular volume depletion (improved) -Optimize volume with normal saline, monitor renal panel and consider renal consult if no improvement. -Serum osmolarity, urine osmolarity and random urine sodium.  Rule out SIADH.  SIADH ? -Renal consult.  CHF, questionable CAD and elevated cardiac enzymes with demand vs. Supply mismatch. 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 No regional wall motion abnormality. -Cardiology evaluation for further ischemia work up and management.  Hypophosphatemia -Replete and monitor electrolytes  Anemia -Keep HB > 7 gm/dl.  Full code  DVT and GI prophylaxis.  Continue with supportive care.  Critical care time 40 minutes

## 2017-12-09 NOTE — Progress Notes (Signed)
1142 Given regular alprazolam per OG tube to help with anxiety.1300  Weaning sedation. 1330 Placed on spontaneous mode on ventilator 40%.1400 Continues to do well with sedation weaning for extubation. 1445 ABG per RT for extubation. Patient extremely anxious but calms with family presence and attention. 1510 ABG not  Within extubation parameters. Patient re sedated.

## 2017-12-10 ENCOUNTER — Inpatient Hospital Stay: Payer: Medicare HMO

## 2017-12-10 LAB — BASIC METABOLIC PANEL
Anion gap: 9 (ref 5–15)
BUN: 24 mg/dL — AB (ref 6–20)
CO2: 34 mmol/L — AB (ref 22–32)
CREATININE: 0.63 mg/dL (ref 0.44–1.00)
Calcium: 8.5 mg/dL — ABNORMAL LOW (ref 8.9–10.3)
Chloride: 91 mmol/L — ABNORMAL LOW (ref 101–111)
GFR calc Af Amer: >60 mL/min (ref >60)
GFR calc non Af Amer: >60 mL/min (ref >60)
Glucose, Bld: 122 mg/dL — ABNORMAL HIGH (ref 65–99)
POTASSIUM: 3.8 mmol/L (ref 3.5–5.1)
Sodium: 134 mmol/L — ABNORMAL LOW (ref 135–145)

## 2017-12-10 LAB — CBC WITH DIFFERENTIAL/PLATELET
BASOS ABS: 0 10*3/uL (ref 0–0.1)
BASOS PCT: 0 %
Eosinophils Absolute: 0 10*3/uL (ref 0–0.7)
Eosinophils Relative: 0 %
HEMATOCRIT: 32.4 % — AB (ref 35.0–47.0)
HEMOGLOBIN: 11.2 g/dL — AB (ref 12.0–16.0)
LYMPHS PCT: 4 %
Lymphs Abs: 0.5 10*3/uL — ABNORMAL LOW (ref 1.0–3.6)
MCH: 31.8 pg (ref 26.0–34.0)
MCHC: 34.5 g/dL (ref 32.0–36.0)
MCV: 92.2 fL (ref 80.0–100.0)
MONO ABS: 0.4 10*3/uL (ref 0.2–0.9)
MONOS PCT: 4 %
NEUTROS PCT: 92 %
Neutro Abs: 10 10*3/uL — ABNORMAL HIGH (ref 1.4–6.5)
Platelets: 352 10*3/uL (ref 150–440)
RBC: 3.52 MIL/uL — ABNORMAL LOW (ref 3.80–5.20)
RDW: 13.6 % (ref 11.5–14.5)
WBC: 10.9 10*3/uL (ref 3.6–11.0)

## 2017-12-10 LAB — BLOOD GAS, ARTERIAL
Acid-Base Excess: 12 mmol/L — ABNORMAL HIGH (ref 0.0–2.0)
Bicarbonate: 40.9 mmol/L — ABNORMAL HIGH (ref 20.0–28.0)
Drawn by: 51610
FIO2: 40
MECHVT: 400 mL
Mechanical Rate: 28
O2 SAT: 95.1 %
PATIENT TEMPERATURE: 37
PCO2 ART: 74 mmHg — AB (ref 32.0–48.0)
PEEP/CPAP: 5 cmH2O
PH ART: 7.35 (ref 7.350–7.450)
pO2, Arterial: 80 mmHg — ABNORMAL LOW (ref 83.0–108.0)

## 2017-12-10 LAB — PHOSPHORUS: Phosphorus: 3 mg/dL (ref 2.5–4.6)

## 2017-12-10 LAB — MAGNESIUM: Magnesium: 2 mg/dL (ref 1.7–2.4)

## 2017-12-10 MED ORDER — DEXMEDETOMIDINE HCL IN NACL 400 MCG/100ML IV SOLN
0.4000 ug/kg/h | INTRAVENOUS | Status: DC
  Administered 2017-12-10: 0.4 ug/kg/h via INTRAVENOUS
  Administered 2017-12-10: 1 ug/kg/h via INTRAVENOUS
  Administered 2017-12-10: 0.5 ug/kg/h via INTRAVENOUS
  Administered 2017-12-11: 0.3 ug/kg/h via INTRAVENOUS
  Filled 2017-12-10 (×2): qty 100

## 2017-12-10 MED ORDER — TRAZODONE HCL 50 MG PO TABS: 50.0000 mg | ORAL_TABLET | Freq: Every evening | ORAL | Status: DC | PRN

## 2017-12-10 MED ORDER — LORAZEPAM 2 MG/ML IJ SOLN
0.5000 mg | Freq: Once | INTRAMUSCULAR | Status: AC
  Administered 2017-12-10: 0.5 mg via INTRAVENOUS
  Filled 2017-12-10: qty 1

## 2017-12-10 MED ORDER — METHYLPREDNISOLONE SODIUM SUCC 40 MG IJ SOLR
40.0000 mg | Freq: Three times a day (TID) | INTRAMUSCULAR | Status: DC
  Administered 2017-12-10 – 2017-12-15 (×14): 40 mg via INTRAVENOUS
  Filled 2017-12-10 (×14): qty 1

## 2017-12-10 NOTE — Progress Notes (Signed)
Extubated patient to 3l [place] with sat goals of 88-92% per MD request. Bipap ordered for distress, if needed.

## 2017-12-10 NOTE — Consult Note (Signed)
Cardiology Consultation Note    Patient ID: Candace Butler, MRN: [id number], DOB/AGE: [birth year] 77 y.o. Admit date: 12/08/2017   Date of Consult: 12/10/2017 Primary Physician: Teodoro Spray, MD Primary Cardiologist: Dr. Ubaldo Glassing  Chief Complaint: intubated Reason for Consultation: respiratory failure ?CAD Requesting MD: Dr. Soyla Murphy  HPI: Candace Butler is a 77 y.o. female with history of chronic respiratory failure on home oxygen at 3 L per nasal cannula chronically, history is normal LV function admitted after presenting to the emergency room with complaints of shortness of breath COPD exacerbation nonresponding to BiPAP.  She was intubated for hyper eucapnia.  She was hypotensive during sedation for intubation.  Troponin was negative on presentation at less than 0.032.  There was one value of 0.15 followed by a value of 0.03.  Electrocardiogram showed sinus rhythm/sinus tachycardia with no ischemic changes.  Chest x-ray showed no pulmonary edema.  No infectious process.  She remains sedated and intubated.  Echocardiogram revealed preserved LV function.  There was no regional wall motion abnormality and no significant valvular abnormality and fraction was 65%.  Mild LVH.  He had evidence of impaired relaxation however no congestive heart failure on chest x-ray.  Echo findings are similar to echo done and [facility] on October 19, 2017.  She was seen in our office in February with complaints of palpitations.  Holter monitor revealed sinus rhythm with frequent PACs and PVCs.  No sustained ventricular or supraventricular arrhythmias.  No pauses.  Her daughter was present during my evaluation today was with her during the office visits and was noted to have intermittent episodes of heart rates in the 40s.  Patient did have bigeminy on Holter monitor which likely explains of bradycardia noted on pulse oximeter.  Been no significant bradycardic episodes during this hospitalization thus far.  Patient remains  intubated and unable to give a history.  Past Medical History:  Diagnosis Date  . Asthma   . COPD (chronic obstructive pulmonary disease) ([place])   . Hypertension       Surgical History:  Past Surgical History:  Procedure Laterality Date  . ABDOMINAL HYSTERECTOMY    . COLON SURGERY       Home Meds: Prior to Admission medications   Medication Sig Start Date End Date Taking? Authorizing Provider  ALPRAZolam Duanne Moron) 0.5 MG tablet Take 0.25 tablets by mouth 2 (two) times daily as needed for anxiety.  05/23/17  Yes [provider]  enalapril-hydrochlorothiazide (VASERETIC) 10-25 MG tablet Take 1 tablet daily by mouth. 04/12/17  Yes [provider]  ADVAIR DISKUS 250-50 MCG/DOSE AEPB Take 1 puff 2 (two) times daily by mouth. 05/28/17   [provider]  albuterol (PROVENTIL HFA;VENTOLIN HFA) 108 (90 Base) MCG/ACT inhaler Inhale 2 puffs every 6 (six) hours as needed into the lungs for wheezing or shortness of breath. 06/16/17   Demetrios Loll, MD  tiotropium (SPIRIVA) 18 MCG inhalation capsule Place 1 capsule (18 mcg total) daily into inhaler and inhale. 06/18/17   Demetrios Loll, MD    Inpatient Medications:  . ALPRAZolam  0.5 mg Oral BID  . aspirin EC  81 mg Oral Daily  . budesonide (PULMICORT) nebulizer solution  0.5 mg Nebulization BID  . chlorhexidine gluconate (MEDLINE KIT)  15 mL Mouth Rinse BID  . heparin  5,000 Units Subcutaneous Q8H  . ipratropium-albuterol  3 mL Nebulization Q6H  . mouth rinse  15 mL Mouth Rinse 10 times per day  . methylPREDNISolone (SOLU-MEDROL) injection  60 mg Intravenous  Q8H   . sodium chloride    . sodium chloride 75 mL/hr at 12/10/17 0600  . azithromycin Stopped (12/09/17 1927)  . ceFEPime (MAXIPIME) IV 1 g (12/10/17 0823)  . dexmedetomidine (PRECEDEX) IV infusion 1 mcg/kg/hr (12/10/17 0809)  . famotidine (PEPCID) IV Stopped (12/09/17 2323)  . fentaNYL infusion INTRAVENOUS 150 mcg/hr (12/10/17 0808)  . midazolam (VERSED) infusion 2  mg/hr (12/10/17 0600)    Allergies:  Allergies  Allergen Reactions  . Indomethacin   . Vicodin [Hydrocodone-Acetaminophen]     Social History   Socioeconomic History  . Marital status: Divorced    Spouse name: Not on file  . Number of children: Not on file  . Years of education: Not on file  . Highest education level: Not on file  Occupational History  . Not on file  Social Needs  . Financial resource strain: Not on file  . Food insecurity:    Worry: Not on file    Inability: Not on file  . Transportation needs:    Medical: Not on file    Non-medical: Not on file  Tobacco Use  . Smoking status: Current Every Day Smoker    Types: Cigarettes  . Smokeless tobacco: Never Used  Substance and Sexual Activity  . Alcohol use: No    Frequency: Never  . Drug use: No  . Sexual activity: Not on file  Lifestyle  . Physical activity:    Days per week: Not on file    Minutes per session: Not on file  . Stress: Not on file  Relationships  . Social connections:    Talks on phone: Not on file    Gets together: Not on file    Attends religious service: Not on file    Active member of club or organization: Not on file    Attends meetings of clubs or organizations: Not on file    Relationship status: Not on file  . Intimate partner violence:    Fear of current or ex partner: Not on file    Emotionally abused: Not on file    Physically abused: Not on file    Forced sexual activity: Not on file  Other Topics Concern  . Not on file  Social History Narrative  . Not on file     No family history on file.   Review of Systems: A 12-system review of systems was performed and is negative except as noted in the HPI.  Labs: Recent Labs    12/08/17 0740 12/08/17 1243 12/08/17 1747 12/08/17 2344  TROPONINI <0.03 <0.03 0.15* <0.03   Lab Results  Component Value Date   WBC 10.9 12/10/2017   HGB 11.2 (L) 12/10/2017   HCT 32.4 (L) 12/10/2017   MCV 92.2 12/10/2017   PLT 352  12/10/2017    Recent Labs  Lab 12/08/17 0740  12/10/17 0503  NA 119*   < > 134*  K 3.6   < > 3.8  CL 67*   < > 91*  CO2 44*   < > 34*  BUN 17   < > 24*  CREATININE 0.50   < > 0.63  CALCIUM 8.6*   < > 8.5*  PROT 7.6  --   --   BILITOT 0.6  --   --   ALKPHOS 58  --   --   ALT 25  --   --   AST 36  --   --   GLUCOSE 161*   < > 122*   < > =  values in this interval not displayed.   No results found for: CHOL, HDL, LDLCALC, TRIG No results found for: DDIMER  Radiology/Studies:  US Venous Img Lower Bilateral  Result Date: 12/08/2017 CLINICAL DATA:  77 year old female with bilateral lower extremity swelling for the past 3-4 days and shortness of breath EXAM: BILATERAL LOWER EXTREMITY VENOUS DOPPLER ULTRASOUND TECHNIQUE: Gray-scale sonography with graded compression, as well as color Doppler and duplex ultrasound were performed to evaluate the lower extremity deep venous systems from the level of the common femoral vein and including the common femoral, femoral, profunda femoral, popliteal and calf veins including the posterior tibial, peroneal and gastrocnemius veins when visible. The superficial great saphenous vein was also interrogated. Spectral Doppler was utilized to evaluate flow at rest and with distal augmentation maneuvers in the common femoral, femoral and popliteal veins. COMPARISON:  None. FINDINGS: RIGHT LOWER EXTREMITY Common Femoral Vein: No evidence of thrombus. Normal compressibility, respiratory phasicity and response to augmentation. Saphenofemoral Junction: No evidence of thrombus. Normal compressibility and flow on color Doppler imaging. Profunda Femoral Vein: No evidence of thrombus. Normal compressibility and flow on color Doppler imaging. Femoral Vein: No evidence of thrombus. Normal compressibility, respiratory phasicity and response to augmentation. Popliteal Vein: No evidence of thrombus. Normal compressibility, respiratory phasicity and response to augmentation. Calf  Veins: No evidence of thrombus. Normal compressibility and flow on color Doppler imaging. Superficial Great Saphenous Vein: No evidence of thrombus. Normal compressibility. Venous Reflux:  None. Other Findings:  None. LEFT LOWER EXTREMITY Common Femoral Vein: No evidence of thrombus. Normal compressibility, respiratory phasicity and response to augmentation. Saphenofemoral Junction: No evidence of thrombus. Normal compressibility and flow on color Doppler imaging. Profunda Femoral Vein: No evidence of thrombus. Normal compressibility and flow on color Doppler imaging. Femoral Vein: No evidence of thrombus. Normal compressibility, respiratory phasicity and response to augmentation. Popliteal Vein: No evidence of thrombus. Normal compressibility, respiratory phasicity and response to augmentation. Calf Veins: No evidence of thrombus. Normal compressibility and flow on color Doppler imaging. Superficial Great Saphenous Vein: No evidence of thrombus. Normal compressibility. Venous Reflux:  None. Other Findings:  None. IMPRESSION: No evidence of deep venous thrombosis. Electronically Signed   By: Jacqulynn Cadet M.D.   On: 12/08/2017 15:56   Dg Chest Port 1 View  Result Date: 12/10/2017 CLINICAL DATA:  Pneumonia. EXAM: PORTABLE CHEST 1 VIEW COMPARISON:  Chest x-ray from yesterday. FINDINGS: Endotracheal tube remains in good position. Unchanged enteric tube entering the stomach with the tip below the field of view. Stable cardiomediastinal silhouette. Coarsened interstitial markings are similar to prior study. Mild atelectasis at the left lung base. No focal consolidation, pleural effusion, or pneumothorax. No acute osseous abnormality. IMPRESSION: No active disease. Electronically Signed   By: Titus Dubin M.D.   On: 12/10/2017 07:10   Dg Chest Port 1 View  Result Date: 12/09/2017 CLINICAL DATA:  Pneumonia EXAM: PORTABLE CHEST 1 VIEW COMPARISON:  12/08/2017, 06/13/2017 FINDINGS: Endotracheal tube tip is about  2.7 cm superior to the carina. Esophageal tube tip is below the diaphragm but not included on the image. Stable cardiomediastinal silhouette with aortic atherosclerosis. Hazy atelectasis versus minimal infiltrate at the left base. No pneumothorax. Stable chronic interstitial opacity. IMPRESSION: 1. Endotracheal tube tip about 2.7 cm superior to the carina 2. Hazy left base atelectasis versus minimal infiltrate Electronically Signed   By: Donavan Foil M.D.   On: 12/09/2017 02:55   Dg Chest Portable 1 View  Result Date: 12/08/2017 CLINICAL DATA:  Asthma-COPD.  Recent intubation.  EXAM: PORTABLE CHEST 1 VIEW COMPARISON:  Chest x-ray of earlier today. FINDINGS: The lungs are well-expanded. The interstitial markings are coarse. There is no pleural effusion, pneumothorax, or pneumomediastinum. The endotracheal tube tip projects at or just above the carina. The esophagogastric tube tip and proximal port project below the GE junction. The heart and pulmonary vascularity are normal. There is calcification in the wall of the aortic arch. IMPRESSION: Mild hyperinflation consistent with known reactive airway disease. No alveolar pneumonia. The endotracheal tube tip is at or just above the carina. Withdrawal by 2-3 cm is recommended. Reasonable positioning of the esophagogastric tube. Electronically Signed   By: David  Martinique M.D.   On: 12/08/2017 09:27   Dg Chest Portable 1 View  Result Date: 12/08/2017 CLINICAL DATA:  Shortness of breath.  History of COPD. EXAM: PORTABLE CHEST 1 VIEW COMPARISON:  06/13/2017 FINDINGS: The cardiac silhouette is upper limits of normal in size. Aortic atherosclerosis is noted. There is peribronchial thickening and chronic appearing interstitial coarsening which are slightly more prominent than on the prior study. Minimal scarring or atelectasis is present in the left lung base. No overt pulmonary edema, segmental consolidation, sizeable pleural effusion, or pneumothorax is identified. No  acute osseous abnormality is seen. IMPRESSION: Increased bronchitic changes. Electronically Signed   By: Logan Bores M.D.   On: 12/08/2017 08:12   Dg Abd Portable 1v  Result Date: 12/09/2017 CLINICAL DATA:  77 y/o  F; enteric tube placement. EXAM: PORTABLE ABDOMEN - 1 VIEW COMPARISON:  None. FINDINGS: The bowel gas pattern is normal. No radio-opaque calculi or other significant radiographic abnormality are seen. Enteric tube tip projects over the distal stomach. IMPRESSION: Enteric tube tip projects over distal stomach. Electronically Signed   By: Kristine Garbe M.D.   On: 12/09/2017 22:38   Dg Abd Portable 1 View  Result Date: 12/08/2017 CLINICAL DATA:  77 year old female enteric tube placement. EXAM: PORTABLE ABDOMEN - 1 VIEW COMPARISON:  Portable chest 0727 hours today. FINDINGS: AP view at 0852 hours. An enteric tube has been placed and courses into the left abdomen. The tip is not identified, but the side hole may be visible at the level of the gastric body (arrow). Mild respiratory motion artifact at the lung bases were ventilation appears stable. Superimposed EKG leads. Paucity of bowel gas in the upper abdomen. IMPRESSION: Enteric tube courses into the stomach, side hole probably at the gastric body level. Electronically Signed   By: Genevie Ann M.D.   On: 12/08/2017 09:27    Wt Readings from Last 3 Encounters:  12/09/17 84.2 kg (185 lb 10 oz)  06/13/17 75.3 kg (166 lb 0.1 oz)    EKG: Normal sinus rhythm with PVCs no ischemia.  Physical Exam: Intubated and sedated Blood pressure 125/70, pulse 68, temperature 98.4 F (36.9 C), temperature source Axillary, resp. rate (!) 28, height '5\' 4"'  (1.626 m), weight 84.2 kg (185 lb 10 oz), SpO2 95 %. Body mass index is 31.86 kg/m. General: Well developed, well nourished, in no acute distress. Head: Normocephalic, atraumatic, sclera non-icteric, no xanthomas, nares are without discharge.  Neck: Negative for carotid bruits. JVD not  elevated. Lungs: Reduced breath sounds on ventilator. Heart: RRR with S1 S2. No murmurs, rubs, or gallops appreciated. Abdomen: Soft, non-tender, non-distended with normoactive bowel sounds. No hepatomegaly. No rebound/guarding. No obvious abdominal masses. Msk:  Strength and tone appear normal for age. Extremities: No clubbing or cyanosis. No edema.  Distal pedal pulses are 2+ and equal bilaterally. Neuro: Intubated and  sedated. Psych: Intubated and sedated..     Assessment and Plan  77 year old female admitted with respiratory failure intubated due to hypercapnia not responsive to BiPAP.  Patient's daughter gives history.  We also have seen her in the office in the recent past.  Work-up in the office was for reports of bradycardia.  Holter monitor revealed sinus rhythm with PVCs and intermittent ventricular bigeminy.  Echo showed preserved LV function with no valvular abnormalities.  There were no bradycardic episodes on the Holter monitor.  No pauses.  Patient's daughter states the patient took extra anxiolytics at time of presentation.  In the emergency room she was noted to be hypercapnic.  She required intubation.  She has ruled out for myocardial infarction.  Serum troponins were negative x2 with one 0.15 followed by 0.03.  0.15 appears to be spurious based on the spike up and down.  Echocardiogram revealed preserved LV function similar to what the echo was showing in our office with me a month and a half ago.  There is no evidence of congestive heart failure.  Patient has no obvious ischemia clinically or by review of electrocardiogram or cardiac markers.  Would continue to attempt ventilator wean.  Further ischemic work-up after extubation could be considered however despite severe hypoxia and hypercapnia, patient has ruled out for myocardial infarction.  Would continue with subcu heparin and defer full dose heparin.  Aspirin 81 mg daily is acceptable.  We will follow with  you.  Signed, Teodoro Spray MD 12/10/2017, 8:50 AM Pager: [phone]

## 2017-12-10 NOTE — Progress Notes (Addendum)
1030 Placed on spontaneous mode on ventilator and pressure support weaned as tolerated.1135 Extubated to 3 liters nasal cannula without issue.

## 2017-12-10 NOTE — Progress Notes (Signed)
1800-1830 Oxygen saturation up and down 74-89% with increased work of breathing. Placed on BiPAP 36%. Precedex sedation restarted at .5 1845 resting calmly with Oxygen sats in the 90s.

## 2017-12-10 NOTE — Progress Notes (Signed)
RN had placed patient into PSV of 10/5 earlier per Dr. Hollace Hayward request, Decreased at this time to PSV of 5/5 per MD request.

## 2017-12-10 NOTE — Progress Notes (Signed)
Pharmacy Antibiotic Note  Candace Butler is a 77 y.o. female admitted on 12/08/2017 with AECOPD.  Pharmacy has been consulted for cefepime dosing. Patient is also ordered azithromycin and was previously ordered Levaquin.   Plan: Cefepime 1 g iv q 8 hours.   Height:  (162.6 cm) Weight: 189 lb 6 oz (85.9 kg) IBW/kg (Calculated) : 54.7  Temp (24hrs), Avg:98.9 F (37.2 C), Min:98.4 F (36.9 C), Max:99.2 F (37.3 C)  Recent Labs  Lab 12/08/17 0740 12/09/17 0547 12/10/17 0503  WBC 8.4 8.3 10.9  CREATININE 0.50 0.62 0.63    Estimated Creatinine Clearance: 60.5 mL/min (by C-G formula based on SCr of 0.63 mg/dL).    Allergies  Allergen Reactions  . Indomethacin   . Vicodin [Hydrocodone-Acetaminophen]     Antimicrobials this admission: Levaquin 5/2 x 1 cefepime 5/2 >>  Azithromycin 5/2 >>  Dose adjustments this admission:   Microbiology results: 5/2 MRSA PCR: negative  Thank you for allowing pharmacy to be a part of this patient's care.  Marthena Whitmyer A 12/10/2017 4:11 PM

## 2017-12-10 NOTE — Progress Notes (Signed)
   Name: Candace Butler MRN: [id number] DOB: [birth year]     CONSULTATION DATE: 12/08/2017 Subjective & Objective; She remains on vent, sedated and no major issues last night  PAST MEDICAL HISTORY :   has a past medical history of Asthma, COPD (chronic obstructive pulmonary disease) ([facility]), and Hypertension.  has a past surgical history that includes Colon surgery and Abdominal hysterectomy. Prior to Admission medications   Medication Sig Start Date End Date Taking? Authorizing Provider  ALPRAZolam Prudy Feeler) 0.5 MG tablet Take 0.25 tablets by mouth 2 (two) times daily as needed for anxiety.  05/23/17  Yes [provider]  enalapril-hydrochlorothiazide (VASERETIC) 10-25 MG tablet Take 1 tablet daily by mouth. 04/12/17  Yes [provider]  ADVAIR DISKUS 250-50 MCG/DOSE AEPB Take 1 puff 2 (two) times daily by mouth. 05/28/17   [provider]  albuterol (PROVENTIL HFA;VENTOLIN HFA) 108 (90 Base) MCG/ACT inhaler Inhale 2 puffs every 6 (six) hours as needed into the lungs for wheezing or shortness of breath. 06/16/17   Shaune Pollack, MD  tiotropium (SPIRIVA) 18 MCG inhalation capsule Place 1 capsule (18 mcg total) daily into inhaler and inhale. 06/18/17   Shaune Pollack, MD   Allergies  Allergen Reactions  . Indomethacin   . Vicodin [Hydrocodone-Acetaminophen]     FAMILY HISTORY:  family history is not on file. SOCIAL HISTORY:  reports that she has been smoking cigarettes.  She has never used smokeless tobacco. She reports that she does not drink alcohol or use drugs.  REVIEW OF SYSTEMS:   Unable to obtain due to critical illness   VITAL SIGNS: Temp:  [98.4 F (36.9 C)-99.2 F (37.3 C)] 98.8 F (37.1 C) (05/04 0800) Pulse Rate:  [63-116] 68 (05/04 0800) Resp:  [12-28] 28 (05/04 0800) BP: (105-151)/(54-89) 132/79 (05/04 0800) SpO2:  [94 %-100 %] 95 % (05/04 0820) FiO2 (%):  [40 %] 40 % (05/04 0820) Weight:  [85.9 kg (189 lb 6 oz)] 85.9 kg (189 lb 6 oz) (05/04  1000)  Physical Examination:  Precedex to RASS of -2. Non focal neuro exam On vent, no distress, bilateral equal air entry and no rales S1 & S2 audible and no murmur Benign abdomen with febrile peristalsis No leg edema   ASSESSMENT / PLAN:  Acute on chronic respiratory failure with base Nasal cannula 3 L/min.Intubated because of hypercapnic respiratory failure did not improve with BiPAP -Monitor ABG, optimize ventilator settings and continue with vent support -Xanax for anxiety. -SAT + SBT and follow with weaning parameters  Acute exacerbation of COPD -Bronchodilators, inhaled steroids, tapering systemic steroids and empiric Cefp + Zithro. D/c Levoflox..  Atelectasis and pneumonia. Improved bibasilar airspace disease. -Cefep + Zithromax. MRSA PCR -ve.Monitor chest x-ray, CBC and FiO2 requirement  Hypotension due to medication improved after fluid resuscitation -Monitor hemodynamics  Hyponatremia and hypo-chloremia with intravascular volume depletion (improved) -Optimize volume with normal saline, monitor renal panel and consider renal consult if no improvement. -Serum osmolarity, urine osmolarity and random urine sodium.Rule out SIADH.  SIADH ? -Renal consult.  CHF, questionable CAD and elevated cardiac enzymes with demand vs. Supply mismatch. 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 No regional wall motion abnormality. -ASA and follow with cardiology.  Anemia -Keep HB > 7 gm/dl.  Full code  DVT and GI prophylaxis. Continue with supportive care.  Critical care time 35 minutes

## 2017-12-10 NOTE — Progress Notes (Signed)
[facility] - [place] at [facility]   PATIENT NAME: Candace Butler    MR#:  [id number]  DATE OF BIRTH:  July 14, 1941  SUBJECTIVE:  Patient seen and evaluated today Is on ventilator for respiratory failure Currently getting spontaneous breathing trial Not much history could be obtained from the patient as she is on ventilator   REVIEW OF SYSTEMS:  Patient on ventilator unable to obtain ROS  DRUG ALLERGIES:   Allergies  Allergen Reactions  . Indomethacin   . Vicodin [Hydrocodone-Acetaminophen]     VITALS:  Blood pressure 132/79, pulse 68, temperature 98.8 F (37.1 C), resp. rate (!) 28, height  (1.626 m), weight 85.9 kg (189 lb 6 oz), SpO2 95 %.  PHYSICAL EXAMINATION:  GENERAL:  77 y.o.-year-old patient lying in the bed on ventilator EYES: Pupils equal, round, reactive to light and accommodation. No scleral icterus. Extraocular muscles intact.  HEENT: Head atraumatic, normocephalic.  Has ET tube NECK:  Supple, no jugular venous distention. No thyroid enlargement, no tenderness.  LUNGS: Decreased breath sounds bilaterally, bilateral wheezing. No use of accessory muscles of respiration.  On ventilator FiO2 35% Pressure support 10 PEEP 5 CARDIOVASCULAR: S1, S2 normal. No murmurs, rubs, or gallops.  ABDOMEN: Soft, nontender, nondistended. Bowel sounds present. No organomegaly or mass.  EXTREMITIES: No pedal edema, cyanosis, or clubbing.  NEUROLOGIC: On ventilator Could not be assessed completely PSYCHIATRIC: could not be assessed SKIN: No obvious rash, lesion, or ulcer.   Physical Exam LABORATORY PANEL:   CBC Recent Labs  Lab 12/10/17 0503  WBC 10.9  HGB 11.2*  HCT 32.4*  PLT 352   ------------------------------------------------------------------------------------------------------------------  Chemistries  Recent Labs  Lab 12/08/17 0740  12/10/17 0503  NA 119*   < > 134*  K 3.6   < > 3.8  CL 67*   < > 91*  CO2 44*   < > 34*  GLUCOSE  161*   < > 122*  BUN 17   < > 24*  CREATININE 0.50   < > 0.63  CALCIUM 8.6*   < > 8.5*  MG 1.5*   < > 2.0  AST 36  --   --   ALT 25  --   --   ALKPHOS 58  --   --   BILITOT 0.6  --   --    < > = values in this interval not displayed.   ------------------------------------------------------------------------------------------------------------------  Cardiac Enzymes Recent Labs  Lab 12/08/17 1747 12/08/17 2344  TROPONINI 0.15* <0.03   ------------------------------------------------------------------------------------------------------------------  RADIOLOGY:  US Venous Img Lower Bilateral  Result Date: 12/08/2017 CLINICAL DATA:  77 year old female with bilateral lower extremity swelling for the past 3-4 days and shortness of breath EXAM: BILATERAL LOWER EXTREMITY VENOUS DOPPLER ULTRASOUND TECHNIQUE: Gray-scale sonography with graded compression, as well as color Doppler and duplex ultrasound were performed to evaluate the lower extremity deep venous systems from the level of the common femoral vein and including the common femoral, femoral, profunda femoral, popliteal and calf veins including the posterior tibial, peroneal and gastrocnemius veins when visible. The superficial great saphenous vein was also interrogated. Spectral Doppler was utilized to evaluate flow at rest and with distal augmentation maneuvers in the common femoral, femoral and popliteal veins. COMPARISON:  None. FINDINGS: RIGHT LOWER EXTREMITY Common Femoral Vein: No evidence of thrombus. Normal compressibility, respiratory phasicity and response to augmentation. Saphenofemoral Junction: No evidence of thrombus. Normal compressibility and flow on color Doppler imaging. Profunda Femoral Vein: No evidence of thrombus. Normal compressibility  and flow on color Doppler imaging. Femoral Vein: No evidence of thrombus. Normal compressibility, respiratory phasicity and response to augmentation. Popliteal Vein: No evidence of  thrombus. Normal compressibility, respiratory phasicity and response to augmentation. Calf Veins: No evidence of thrombus. Normal compressibility and flow on color Doppler imaging. Superficial Great Saphenous Vein: No evidence of thrombus. Normal compressibility. Venous Reflux:  None. Other Findings:  None. LEFT LOWER EXTREMITY Common Femoral Vein: No evidence of thrombus. Normal compressibility, respiratory phasicity and response to augmentation. Saphenofemoral Junction: No evidence of thrombus. Normal compressibility and flow on color Doppler imaging. Profunda Femoral Vein: No evidence of thrombus. Normal compressibility and flow on color Doppler imaging. Femoral Vein: No evidence of thrombus. Normal compressibility, respiratory phasicity and response to augmentation. Popliteal Vein: No evidence of thrombus. Normal compressibility, respiratory phasicity and response to augmentation. Calf Veins: No evidence of thrombus. Normal compressibility and flow on color Doppler imaging. Superficial Great Saphenous Vein: No evidence of thrombus. Normal compressibility. Venous Reflux:  None. Other Findings:  None. IMPRESSION: No evidence of deep venous thrombosis. Electronically Signed   By: Malachy Moan M.D.   On: 12/08/2017 15:56   Dg Chest Port 1 View  Result Date: 12/10/2017 CLINICAL DATA:  Pneumonia. EXAM: PORTABLE CHEST 1 VIEW COMPARISON:  Chest x-ray from yesterday. FINDINGS: Endotracheal tube remains in good position. Unchanged enteric tube entering the stomach with the tip below the field of view. Stable cardiomediastinal silhouette. Coarsened interstitial markings are similar to prior study. Mild atelectasis at the left lung base. No focal consolidation, pleural effusion, or pneumothorax. No acute osseous abnormality. IMPRESSION: No active disease. Electronically Signed   By: Obie Dredge M.D.   On: 12/10/2017 07:10   Dg Chest Port 1 View  Result Date: 12/09/2017 CLINICAL DATA:  Pneumonia EXAM: PORTABLE  CHEST 1 VIEW COMPARISON:  12/08/2017, 06/13/2017 FINDINGS: Endotracheal tube tip is about 2.7 cm superior to the carina. Esophageal tube tip is below the diaphragm but not included on the image. Stable cardiomediastinal silhouette with aortic atherosclerosis. Hazy atelectasis versus minimal infiltrate at the left base. No pneumothorax. Stable chronic interstitial opacity. IMPRESSION: 1. Endotracheal tube tip about 2.7 cm superior to the carina 2. Hazy left base atelectasis versus minimal infiltrate Electronically Signed   By: Jasmine Pang M.D.   On: 12/09/2017 02:55   Dg Abd Portable 1v  Result Date: 12/09/2017 CLINICAL DATA:  77 y/o  F; enteric tube placement. EXAM: PORTABLE ABDOMEN - 1 VIEW COMPARISON:  None. FINDINGS: The bowel gas pattern is normal. No radio-opaque calculi or other significant radiographic abnormality are seen. Enteric tube tip projects over the distal stomach. IMPRESSION: Enteric tube tip projects over distal stomach. Electronically Signed   By: Mitzi Hansen M.D.   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 Stable Secondary to acute asthma/COPD exacerbation Continue ICU protocol, ventilator weaning as tolerated, empiric cefepime/azithromycin, aggressive pulmonary toilet with bronchodilator therapy, follow-up on cultures pulmonary toilet with bronchodilator therapy, empiric Levaquin, IVFs for rehydration, IV Solu-Medrol with tapering as tolerated, CEs if consistent with ACS, and continue close medical monitoring Echocardiogram noted for stage I diastolic dysfunction  *Acute asthma/COPD exacerbation Stable Continue above plan of management  *Chronic benign essential hypertension Stable on current regiment  *Chronic obesity Most likely secondary to excess calories Lifestyle modification recommended  *Acute hyponatremia improving  Weaning trials today, if successful patient will be  extubated Discussed with critical care attending plan of management  Discussed plan of management  with patient's family in detail     All the records are reviewed and case discussed with Care Management/Social Workerr. Management plans discussed with the patient, family and they are in agreement.  CODE STATUS:full  TOTAL TIME TAKING CARE OF THIS PATIENT: 36 minutes.     POSSIBLE D/C IN 3-5 DAYS, DEPENDING ON CLINICAL CONDITION.   Ihor Austin M.D on 12/10/2017   Between 7am to 6pm - Pager - [phone]  After 6pm go to [email or web address] - password [email or web address]  [facility] [place] [facility]  Office  [phone]  CC: Primary care physician; Dalia Heading, MD  Note: This dictation was prepared with Dragon dictation along with smaller phrase technology. Any transcriptional errors that result from this process are unintentional.

## 2017-12-10 NOTE — Progress Notes (Signed)
Called [facility] and spoke with [employer]. RN r/t pt.'s low UOP this shift so far. Discussed last BUN & Cr. Levels, current rate of IVF, breath sounds and PMHx. Loraine Leriche, RN stated he will pass info along to Dr. Darrick Penna, will continue to monitor pt. Closely.

## 2017-12-11 LAB — BLOOD GAS, ARTERIAL
Acid-Base Excess: 8.2 mmol/L — ABNORMAL HIGH (ref 0.0–2.0)
BICARBONATE: 35.9 mmol/L — AB (ref 20.0–28.0)
FIO2: 0.32
O2 Saturation: 95.7 %
PCO2 ART: 68 mmHg — AB (ref 32.0–48.0)
PO2 ART: 85 mmHg (ref 83.0–108.0)
Patient temperature: 37
pH, Arterial: 7.33 — ABNORMAL LOW (ref 7.350–7.450)

## 2017-12-11 LAB — BASIC METABOLIC PANEL
ANION GAP: 7 (ref 5–15)
BUN: 27 mg/dL — ABNORMAL HIGH (ref 6–20)
CALCIUM: 8.2 mg/dL — AB (ref 8.9–10.3)
CO2: 34 mmol/L — ABNORMAL HIGH (ref 22–32)
Chloride: 93 mmol/L — ABNORMAL LOW (ref 101–111)
Creatinine, Ser: 0.43 mg/dL — ABNORMAL LOW (ref 0.44–1.00)
GFR calc non Af Amer: >60 mL/min (ref >60)
GLUCOSE: 128 mg/dL — AB (ref 65–99)
Potassium: 4.3 mmol/L (ref 3.5–5.1)
SODIUM: 134 mmol/L — AB (ref 135–145)

## 2017-12-11 LAB — CBC WITH DIFFERENTIAL/PLATELET
BASOS ABS: 0 10*3/uL (ref 0–0.1)
BASOS PCT: 0 %
EOS ABS: 0 10*3/uL (ref 0–0.7)
EOS PCT: 0 %
HCT: 32.8 % — ABNORMAL LOW (ref 35.0–47.0)
Hemoglobin: 11 g/dL — ABNORMAL LOW (ref 12.0–16.0)
Lymphocytes Relative: 7 %
Lymphs Abs: 0.7 10*3/uL — ABNORMAL LOW (ref 1.0–3.6)
MCH: 31.2 pg (ref 26.0–34.0)
MCHC: 33.6 g/dL (ref 32.0–36.0)
MCV: 92.9 fL (ref 80.0–100.0)
MONO ABS: 0.5 10*3/uL (ref 0.2–0.9)
Monocytes Relative: 5 %
Neutro Abs: 9.4 10*3/uL — ABNORMAL HIGH (ref 1.4–6.5)
Neutrophils Relative %: 88 %
Platelets: 360 10*3/uL (ref 150–440)
RBC: 3.53 MIL/uL — ABNORMAL LOW (ref 3.80–5.20)
RDW: 13.9 % (ref 11.5–14.5)
WBC: 10.7 10*3/uL (ref 3.6–11.0)

## 2017-12-11 LAB — CALCIUM, IONIZED: Calcium, Ionized, Serum: 4.5 mg/dL (ref 4.5–5.6)

## 2017-12-11 MED ORDER — ENALAPRIL MALEATE 10 MG PO TABS
10.0000 mg | ORAL_TABLET | Freq: Every day | ORAL | Status: DC
  Administered 2017-12-11 – 2017-12-19 (×9): 10 mg via ORAL
  Filled 2017-12-11 (×9): qty 1

## 2017-12-11 NOTE — Progress Notes (Signed)
[facility] - [place] at [facility]   PATIENT NAME: Candace Butler    MR#:  [id number]  DATE OF BIRTH:  05-28-1941  SUBJECTIVE:  Patient seen and evaluated today (77-year-old) Was extubated last evening Desaturated this am and was put on BIPAP Patient did not want BIPAP and was switched to high flow oxygen Awake and respond to verbal commands   REVIEW OF SYSTEMS:  Patient on ventilator unable to obtain Review of Systems  Constitutional: Negative.   HENT: Negative.   Eyes: Negative.   Respiratory: Positive for cough, shortness of breath and wheezing.   Cardiovascular: Negative.   Gastrointestinal: Negative.   Genitourinary: Negative.   Musculoskeletal: Negative.   Skin: Negative.   Neurological: Negative.   Endo/Heme/Allergies: Negative.   Psychiatric/Behavioral: Negative.     DRUG ALLERGIES:   Allergies  Allergen Reactions  . Indomethacin   . Vicodin [Hydrocodone-Acetaminophen]     VITALS:  Blood pressure (!) 174/96, pulse 87, temperature 98.9 F (37.2 C), temperature source Oral, resp. rate (!) 22, height  (1.626 m), weight 85.9 kg (189 lb 6 oz), SpO2 94 %.  PHYSICAL EXAMINATION:  GENERAL:  77 y.o.-year-old patient lying in the bed on high flow oxygen EYES: Pupils equal, round, reactive to light and accommodation. No scleral icterus. Extraocular muscles intact.  HEENT: Head atraumatic, normocephalic.  Has ET tube NECK:  Supple, no jugular venous distention. No thyroid enlargement, no tenderness.  LUNGS:Improved breath sounds bilaterally, scattered wheezing bilaterally. No use of accessory muscles of respiration CARDIOVASCULAR: S1, S2 normal. No murmurs, rubs, or gallops.  ABDOMEN: Soft, nontender, nondistended. Bowel sounds present. No organomegaly or mass.  EXTREMITIES: No pedal edema, cyanosis, or clubbing.  NEUROLOGIC: On ventilator Could not be assessed completely PSYCHIATRIC: could not be assessed SKIN: No obvious rash, lesion, or ulcer.    Physical Exam LABORATORY PANEL:   CBC Recent Labs  Lab 12/11/17 0511  WBC 10.7  HGB 11.0*  HCT 32.8*  PLT 360   ------------------------------------------------------------------------------------------------------------------  Chemistries  Recent Labs  Lab 12/08/17 0740  12/10/17 0503 12/11/17 0511  NA 119*   < > 134* 134*  K 3.6   < > 3.8 4.3  CL 67*   < > 91* 93*  CO2 44*   < > 34* 34*  GLUCOSE 161*   < > 122* 128*  BUN 17   < > 24* 27*  CREATININE 0.50   < > 0.63 0.43*  CALCIUM 8.6*   < > 8.5* 8.2*  MG 1.5*   < > 2.0  --   AST 36  --   --   --   ALT 25  --   --   --   ALKPHOS 58  --   --   --   BILITOT 0.6  --   --   --    < > = values in this interval not displayed.   ------------------------------------------------------------------------------------------------------------------  Cardiac Enzymes Recent Labs  Lab 12/08/17 1747 12/08/17 2344  TROPONINI 0.15* <0.03   ------------------------------------------------------------------------------------------------------------------  RADIOLOGY:  Dg Chest Port 1 View  Result Date: 12/10/2017 CLINICAL DATA:  Pneumonia. EXAM: PORTABLE CHEST 1 VIEW COMPARISON:  Chest x-ray from yesterday. FINDINGS: Endotracheal tube remains in good position. Unchanged enteric tube entering the stomach with the tip below the field of view. Stable cardiomediastinal silhouette. Coarsened interstitial markings are similar to prior study. Mild atelectasis at the left lung base. No focal consolidation, pleural effusion, or pneumothorax. No acute osseous abnormality. IMPRESSION: No active disease. Electronically  Signed   By: Obie Dredge M.D.   On: 12/10/2017 07:10   Dg Abd Portable 1v  Result Date: 12/09/2017 CLINICAL DATA:  77 y/o  F; enteric tube placement. EXAM: PORTABLE ABDOMEN - 1 VIEW COMPARISON:  None. FINDINGS: The bowel gas pattern is normal. No radio-opaque calculi or other significant radiographic abnormality are seen.  Enteric tube tip projects over the distal stomach. IMPRESSION: Enteric tube tip projects over distal stomach. Electronically Signed   By: Mitzi Hansen M.D.   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 Off ventilator Continue high flow oxygen Secondary to acute asthma/COPD exacerbation  empiric cefepime/azithromycin, aggressive pulmonary toilet with bronchodilator therapy, follow-up on cultures pulmonary toilet with bronchodilator therapy, empiric Levaquin, IVFs for rehydration, IV Solu-Medrol with tapering as tolerated, CEs if consistent with ACS, and continue close medical monitoring Echocardiogram noted for stage I diastolic dysfunction  *Acute asthma/COPD exacerbation Stable Continue above plan of management  *Chronic benign essential hypertension Stable on current regiment  *Chronic obesity Most likely secondary to excess calories Lifestyle modification recommended  *Acute hyponatremia resolved  Discussed with critical care attending plan of management       All the records are reviewed and case discussed with Care Management/Social Workerr. Management plans discussed with the patient, family and they are in agreement.  CODE STATUS:full  TOTAL TIME TAKING CARE OF THIS PATIENT: 35 minutes.     POSSIBLE D/C IN 3-5 DAYS, DEPENDING ON CLINICAL CONDITION.   Ihor Austin M.D on 12/11/2017   Between 7am to 6pm - Pager - [phone]  After 6pm go to [email or web address] - password [employer]  [facility] [place] [facility]  Office  [phone]  CC: Primary care physician; Dalia Heading, MD  Note: This dictation was prepared with Dragon dictation along with smaller phrase technology. Any transcriptional errors that result from this process are unintentional.

## 2017-12-11 NOTE — Progress Notes (Signed)
   Name: Freya Zobrist MRN: [id number] DOB: 1941-03-12     CONSULTATION DATE: 12/08/2017  Subjective & Objective: extubated yesterday, tolerating [place] / BiPAP and off Precedex  PAST MEDICAL HISTORY :   has a past medical history of Asthma, COPD (chronic obstructive pulmonary disease) (HCC), and Hypertension.  has a past surgical history that includes Colon surgery and Abdominal hysterectomy. Prior to Admission medications   Medication Sig Start Date End Date Taking? Authorizing Provider  ALPRAZolam Prudy Feeler) 0.5 MG tablet Take 0.25 tablets by mouth 2 (two) times daily as needed for anxiety.  05/23/17  Yes [provider]  enalapril-hydrochlorothiazide (VASERETIC) 10-25 MG tablet Take 1 tablet daily by mouth. 04/12/17  Yes [provider]  ADVAIR DISKUS 250-50 MCG/DOSE AEPB Take 1 puff 2 (two) times daily by mouth. 05/28/17   [provider]  albuterol (PROVENTIL HFA;VENTOLIN HFA) 108 (90 Base) MCG/ACT inhaler Inhale 2 puffs every 6 (six) hours as needed into the lungs for wheezing or shortness of breath. 06/16/17   Shaune Pollack, MD  tiotropium (SPIRIVA) 18 MCG inhalation capsule Place 1 capsule (18 mcg total) daily into inhaler and inhale. 06/18/17   Shaune Pollack, MD   Allergies  Allergen Reactions  . Indomethacin   . Vicodin [Hydrocodone-Acetaminophen]     FAMILY HISTORY:  family history is not on file. SOCIAL HISTORY:  reports that she has been smoking cigarettes.  She has never used smokeless tobacco. She reports that she does not drink alcohol or use drugs.  REVIEW OF SYSTEMS:   Unable to obtain due to critical illness   VITAL SIGNS: Temp:  [98 F (36.7 C)-98.9 F (37.2 C)] 98.9 F (37.2 C) (05/05 0800) Pulse Rate:  [48-87] 87 (05/05 0800) Resp:  [10-20] 20 (05/05 0800) BP: (139-174)/(57-96) 174/96 (05/05 0800) SpO2:  [81 %-100 %] 90 % (05/05 0825) FiO2 (%):  [36 %-40 %] 36 % (05/04 1900) Weight:  [85.9 kg (189 lb 6 oz)] 85.9 kg (189 lb 6 oz) (05/05  0406)  Physical Examination:  A & O.  Non focal neuro exam On [place], no distress, bilateral equal air entry and no rales S1 & S2 audible and no murmur Benign abdomen with febrile peristalsis No leg edema  ASSESSMENT / PLAN:  Acute on chronic respiratory failure with base Nasal cannula 3 L/min.extubated and tolerating / BiPAP. -Monitor ABG -Xanax for anxiety. -Keep O2 sat 99-92%  Acute exacerbation of COPD -Bronchodilators, inhaled steroids, tapering systemic steroids and empiricCefp + Zithro. D/c Levoflox..  Atelectasis and pneumonia. Improved bibasilar airspace disease. -Cefep + Zithromax.MRSA PCR -ve.Monitor chest x-ray, CBC and FiO2 requirement  HTN -Optimize antihypertensives and monitor hemodynamics.  Hypotension due to medication improved after fluid resuscitation -Monitor hemodynamics  Hyponatremia and hypo-chloremia with intravascular volume depletion(improved) -Optimize volume with normal saline, monitor renal panel and consider renal consult if no improvement. -Serum osmolarity, urine osmolarity and random urine sodium.Rule out SIADH.  SIADH ? -Renal consult.  CHF, questionable CAD and elevated cardiac enzymes with demand vs. Supply mismatch. 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 No regional wall motion abnormality. -ASA and follow with cardiology.  Anemia -Keep HB > 7 gm/dl.  Full code  DVT and GI prophylaxis. Continue with supportive care.  Critical care time 35 minutes

## 2017-12-11 NOTE — Progress Notes (Signed)
Consult received for hyponatremia Sodium is close to normal.  Please reconsult as necessary

## 2017-12-11 NOTE — Progress Notes (Signed)
Pharmacy Antibiotic Note  Candace Butler is a 77 y.o. female admitted on 12/08/2017 with AECOPD.  Pharmacy has been consulted for cefepime dosing. Patient is also ordered azithromycin and was previously ordered Levaquin.   Plan: Day 4- Cefepime 1 g iv q 8 hours.   Height:  (162.6 cm) Weight: 189 lb 6 oz (85.9 kg) IBW/kg (Calculated) : 54.7  Temp (24hrs), Avg:98.3 F (36.8 C), Min:98 F (36.7 C), Max:98.9 F (37.2 C)  Recent Labs  Lab 12/08/17 0740 12/09/17 0547 12/10/17 0503 12/11/17 0511  WBC 8.4 8.3 10.9 10.7  CREATININE 0.50 0.62 0.63 0.43*    Estimated Creatinine Clearance: 60.5 mL/min (A) (by C-G formula based on SCr of 0.43 mg/dL (L)).    Allergies  Allergen Reactions  . Indomethacin   . Vicodin [Hydrocodone-Acetaminophen]     Antimicrobials this admission: Levaquin 5/2 x 1 cefepime 5/2 >>  Azithromycin 5/2 >>  Dose adjustments this admission:   Microbiology results: 5/2 MRSA PCR: negative  Thank you for allowing pharmacy to be a part of this patient's care.  Kennetta Pavlovic A 12/11/2017 1:07 PM

## 2017-12-12 LAB — CBC WITH DIFFERENTIAL/PLATELET
Basophils Absolute: 0 10*3/uL (ref 0–0.1)
Basophils Relative: 0 %
EOS PCT: 0 %
Eosinophils Absolute: 0 10*3/uL (ref 0–0.7)
HEMATOCRIT: 34 % — AB (ref 35.0–47.0)
Hemoglobin: 11.5 g/dL — ABNORMAL LOW (ref 12.0–16.0)
Lymphocytes Relative: 8 %
Lymphs Abs: 0.9 10*3/uL — ABNORMAL LOW (ref 1.0–3.6)
MCH: 31.4 pg (ref 26.0–34.0)
MCHC: 33.8 g/dL (ref 32.0–36.0)
MCV: 92.8 fL (ref 80.0–100.0)
MONO ABS: 0.8 10*3/uL (ref 0.2–0.9)
MONOS PCT: 7 %
NEUTROS ABS: 10 10*3/uL — AB (ref 1.4–6.5)
Neutrophils Relative %: 85 %
PLATELETS: 447 10*3/uL — AB (ref 150–440)
RBC: 3.67 MIL/uL — ABNORMAL LOW (ref 3.80–5.20)
RDW: 13.4 % (ref 11.5–14.5)
WBC: 11.8 10*3/uL — ABNORMAL HIGH (ref 3.6–11.0)

## 2017-12-12 LAB — BASIC METABOLIC PANEL
ANION GAP: 5 (ref 5–15)
BUN: 18 mg/dL (ref 6–20)
CO2: 37 mmol/L — AB (ref 22–32)
CREATININE: 0.54 mg/dL (ref 0.44–1.00)
Calcium: 8.3 mg/dL — ABNORMAL LOW (ref 8.9–10.3)
Chloride: 93 mmol/L — ABNORMAL LOW (ref 101–111)
GFR calc Af Amer: >60 mL/min (ref >60)
GLUCOSE: 101 mg/dL — AB (ref 65–99)
Potassium: 4.1 mmol/L (ref 3.5–5.1)
Sodium: 135 mmol/L (ref 135–145)

## 2017-12-12 LAB — GLUCOSE, CAPILLARY
Glucose-Capillary: 93 mg/dL (ref 65–99)
Glucose-Capillary: 93 mg/dL (ref 65–99)

## 2017-12-12 MED ORDER — IPRATROPIUM-ALBUTEROL 0.5-2.5 (3) MG/3ML IN SOLN
3.0000 mL | RESPIRATORY_TRACT | Status: DC
  Administered 2017-12-12 – 2017-12-14 (×13): 3 mL via RESPIRATORY_TRACT
  Filled 2017-12-12 (×13): qty 3

## 2017-12-12 MED ORDER — ENSURE ENLIVE PO LIQD
237.0000 mL | Freq: Two times a day (BID) | ORAL | Status: DC
  Administered 2017-12-13 – 2017-12-19 (×10): 237 mL via ORAL

## 2017-12-12 MED ORDER — ADULT MULTIVITAMIN W/MINERALS CH
1.0000 | ORAL_TABLET | Freq: Every day | ORAL | Status: DC
  Administered 2017-12-13 – 2017-12-19 (×7): 1 via ORAL
  Filled 2017-12-12 (×7): qty 1

## 2017-12-12 NOTE — Progress Notes (Addendum)
[facility] - [place] at [facility]   PATIENT NAME: Candace Butler    MR#:  [id number]  DATE OF BIRTH:  [birth year]  SUBJECTIVE:  Patient seen and evaluated today Comfortable on oxygen via nasal cannula Awake and respond to verbal commands Decreased shortness of breath No chest pain No fever and chills   REVIEW OF SYSTEMS:   Review of Systems  Constitutional: Negative.   HENT: Negative.   Eyes: Negative.   Respiratory: Positive for cough, shortness of breath and wheezing.   Cardiovascular: Negative.   Gastrointestinal: Negative.   Genitourinary: Negative.   Musculoskeletal: Negative.   Skin: Negative.   Neurological: Negative.   Endo/Heme/Allergies: Negative.   Psychiatric/Behavioral: Negative.     DRUG ALLERGIES:   Allergies  Allergen Reactions  . Indomethacin   . Vicodin [Hydrocodone-Acetaminophen]     VITALS:  Blood pressure (!) 146/90, pulse 76, temperature 98.4 F (36.9 C), temperature source Oral, resp. rate 14, height  (1.626 m), weight 88.8 kg (195 lb 12.3 oz), SpO2 97 %.  PHYSICAL EXAMINATION:  GENERAL:  77 y.o.-year-old patient lying in the bed on his oxygen via nasal cannula EYES: Pupils equal, round, reactive to light and accommodation. No scleral icterus. Extraocular muscles intact.  HEENT: Head atraumatic, normocephalic.  Has ET tube NECK:  Supple, no jugular venous distention. No thyroid enlargement, no tenderness.  LUNGS:Improved breath sounds bilaterally, scattered wheezing bilaterally. No use of accessory muscles of respiration CARDIOVASCULAR: S1, S2 normal. No murmurs, rubs, or gallops.  ABDOMEN: Soft, nontender, nondistended. Bowel sounds present. No organomegaly or mass.  EXTREMITIES: No pedal edema, cyanosis, or clubbing.  NEUROLOGIC: Oriented to time place and person Moves lower extremities PSYCHIATRIC: Mood is pleasant SKIN: No obvious rash, lesion, or ulcer.   Physical Exam LABORATORY PANEL:   CBC Recent Labs   Lab 12/12/17 0510  WBC 11.8*  HGB 11.5*  HCT 34.0*  PLT 447*   ------------------------------------------------------------------------------------------------------------------  Chemistries  Recent Labs  Lab 12/08/17 0740  12/10/17 0503  12/12/17 0510  NA 119*   < > 134*   < > 135  K 3.6   < > 3.8   < > 4.1  CL 67*   < > 91*   < > 93*  CO2 44*   < > 34*   < > 37*  GLUCOSE 161*   < > 122*   < > 101*  BUN 17   < > 24*   < > 18  CREATININE 0.50   < > 0.63   < > 0.54  CALCIUM 8.6*   < > 8.5*   < > 8.3*  MG 1.5*   < > 2.0  --   --   AST 36  --   --   --   --   ALT 25  --   --   --   --   ALKPHOS 58  --   --   --   --   BILITOT 0.6  --   --   --   --    < > = values in this interval not displayed.   ------------------------------------------------------------------------------------------------------------------  Cardiac Enzymes Recent Labs  Lab 12/08/17 1747 12/08/17 2344  TROPONINI 0.15* <0.03   ------------------------------------------------------------------------------------------------------------------  RADIOLOGY:  No results found.  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 Off ventilator and off BiPAP Continue oxygen via nasal canula  *Acute asthma/COPD exacerbation improving Continue IV Solu-Medrol Nebulization treatments Continue IV cefepime and Zithromax antibiotics  Echocardiogram shows stage I diastolic dysfunction  *Chronic benign essential hypertension Stable on current regiment  *Chronic obesity Most likely secondary to excess calories Lifestyle modification recommended  *Acute hyponatremia resolved  *Obesity Lifestyle modification recommended  *Transferred to medical floor today  Discussed with critical care attending plan of management       All the records are reviewed and case discussed with Care Management/Social Workerr. Management plans discussed with the patient, family and they  are in agreement.  CODE STATUS:full  TOTAL TIME TAKING CARE OF THIS PATIENT: 34 minutes.     POSSIBLE D/C IN 2 to 3 DAYS, DEPENDING ON CLINICAL CONDITION.   Ihor Austin M.D on 12/12/2017   Between 7am to 6pm - Pager - [phone]  After 6pm go to [email or web address] - password [employer]  [facility] [place] [facility]  Office  [phone]  CC: Primary care physician; Dalia Heading, MD  Note: This dictation was prepared with Dragon dictation along with smaller phrase technology. Any transcriptional errors that result from this process are unintentional.

## 2017-12-12 NOTE — Progress Notes (Signed)
   Name: Catrinia Stanek MRN: [id number] DOB:Kamerin [birth year]ford[birth year]     CONSULTATION DATE: 12/08/2017  Subjective & Objective: extubated yesterday, tolerating [place] / BiPAP and off Precedex  PAST MEDICAL HISTORY :   has a past medical history of Asthma, COPD (chronic obstructive pulmonary disease) (HCC), and Hypertension.  has a past surgical history that includes Colon surgery and Abdominal hysterectomy. Prior to Admission medications   Medication Sig Start Date End Date Taking? Authorizing Provider  ALPRAZolam Prudy Feeler) 0.5 MG tablet Take 0.25 tablets by mouth 2 (two) times daily as needed for anxiety.  05/23/17  Yes [provider]  enalapril-hydrochlorothiazide (VASERETIC) 10-25 MG tablet Take 1 tablet daily by mouth. 04/12/17  Yes [provider]  ADVAIR DISKUS 250-50 MCG/DOSE AEPB Take 1 puff 2 (two) times daily by mouth. 05/28/17   [provider]  albuterol (PROVENTIL HFA;VENTOLIN HFA) 108 (90 Base) MCG/ACT inhaler Inhale 2 puffs every 6 (six) hours as needed into the lungs for wheezing or shortness of breath. 06/16/17   Shaune Pollack, MD  tiotropium (SPIRIVA) 18 MCG inhalation capsule Place 1 capsule (18 mcg total) daily into inhaler and inhale. 06/18/17   Shaune Pollack, MD   Allergies  Allergen Reactions  . Indomethacin   . Vicodin [Hydrocodone-Acetaminophen]     FAMILY HISTORY:  family history is not on file. SOCIAL HISTORY:  reports that she has been smoking cigarettes.  She has never used smokeless tobacco. She reports that she does not drink alcohol or use drugs.  REVIEW OF SYSTEMS:   Unable to obtain due to critical illness   VITAL SIGNS: Temp:  [98.4 F (36.9 C)-98.7 F (37.1 C)] 98.4 F (36.9 C) (05/06 0400) Pulse Rate:  [76-100] 76 (05/06 0700) Resp:  [14-27] 14 (05/06 0700) BP: (145-153)/(82-91) 146/90 (05/06 0700) SpO2:  [86 %-98 %] 97 % (05/06 0800) FiO2 (%):  [32 %-45 %] 38 % (05/06 0800) Weight:  [195 lb 12.3 oz (88.8 kg)] 195 lb 12.3 oz (88.8 kg)  (05/06 0451)  Physical Examination:  A & O.  Non focal neuro exam On [place], no distress, bilateral equal air entry and no rales S1 & S2 audible and no murmur Benign abdomen with febrile peristalsis No leg edema  ASSESSMENT / PLAN:  Acute on chronic respiratory failure with base Nasal cannula 3 L/min.extubated and tolerating [place]/ BiPAP. -Monitor ABG -Xanax for anxiety. -Keep O2 sat 99-92% Bronchodilators, inhaled steroids, tapering systemic steroids and empiricCefp + Zithro. D/c Levoflox..  Hyponatremia and hypo-chloremia with intravascular volume depletion(improved) -Optimize volume with normal saline, monitor renal panel  -Serum osmolarity, urine osmolarity and random urine sodium pending.   CHF, questionable CAD and elevated cardiac enzymes with demand vs. Supply mismatch. 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 No regional wall motion abnormality. -ASA and follow with cardiology.  Marland KitchenStable for floor transfer  [name], DO  Patient ID: Denese Mentink, female   DOB: 11/01/1940, 77 y.o.   MRN: [id number]

## 2017-12-12 NOTE — Progress Notes (Signed)
Pharmacy Antibiotic Note  Candace Butler is a 77 y.o. female admitted on 12/08/2017 with AECOPD.  Pharmacy has been consulted for cefepime dosing. Patient is also ordered azithromycin.  Plan: Per discussion on AM ICU rounds, will continue cefepime 1g IV Q8hr through today.   Height:  (162.6 cm) Weight: 195 lb 12.3 oz (88.8 kg) IBW/kg (Calculated) : 54.7  Temp (24hrs), Avg:98.5 F (36.9 C), Min:98.4 F (36.9 C), Max:98.6 F (37 C)  Recent Labs  Lab 12/08/17 0740 12/09/17 0547 12/10/17 0503 12/11/17 0511 12/12/17 0510  WBC 8.4 8.3 10.9 10.7 11.8*  CREATININE 0.50 0.62 0.63 0.43* 0.54    Estimated Creatinine Clearance: 61.5 mL/min (by C-G formula based on SCr of 0.54 mg/dL).    Allergies  Allergen Reactions  . Indomethacin   . Vicodin [Hydrocodone-Acetaminophen]     Antimicrobials this admission: Levaquin 5/2 x 1 Cefepime 5/2 >> 5/6 Azithromycin 5/2 >> 5/6  Dose adjustments this admission: N/A  Microbiology results: 5/2 MRSA PCR: negative  Thank you for allowing pharmacy to be a part of this patient's care.  Rocket Gunderson L 12/12/2017 2:04 PM

## 2017-12-12 NOTE — Progress Notes (Signed)
Nutrition Follow-up  DOCUMENTATION CODES:   Obesity unspecified  INTERVENTION:  Provide Ensure Enlive po BID, each supplement provides 350 kcal and 20 grams of protein.  Provide daily MVI.  Patient has still not had a bowel movement this admission. Recommend adding a bowel regimen.  NUTRITION DIAGNOSIS:   Inadequate oral intake related to decreased appetite as evidenced by per patient/family report.  New nutrition diagnosis.  GOAL:   Patient will meet greater than or equal to 90% of their needs  Progressing.  MONITOR:   PO intake, Supplement acceptance, Labs, Weight trends, I & O's  REASON FOR ASSESSMENT:   Ventilator    ASSESSMENT:   77 years old lady with history of chronic respiratory failure on home O2 3 L/min nasal cannula, COPD and hypertension.  Patient is admitted with acute on chronic respiratory failure, COPD exacerbation, did not respond to BiPAP and had to be intubated because of hypercarbic respiratory failure.   -Patient was extubated on 5/4. -Diet was advanced to clear liquids on 5/4 and then heart healthy today (5/6).  Met with patient at bedside. She reports that this morning she had some scrambled eggs and a muffin with jelly. She tolerated the meal well. Denies any N/V or abdominal pain. She reports that PTA she had a decreased appetite for several weeks. She is unable to describe why or provide a very good nutrition history. She is amenable to drinking an ONS to help meet calorie/protein needs. No food allergies or intolerances. Patient reports her UBW was 165-170 lbs. She was 186.1 lbs on admission and weight is currently elevated, likely with fluid.  Medications reviewed and include: Xanax, Solu-Medrol 40 mg Q8hrs IV, azithromycin.  Labs reviewed: CBG 93, Chloride 93, CO2 37.  Discussed with RN and on rounds.  Diet Order:   Diet Order           Diet Heart Room service appropriate? Yes; Fluid consistency: Thin  Diet effective now           EDUCATION NEEDS:   No education needs have been identified at this time  Skin:  Skin Assessment: Reviewed RN Assessment  Last BM:  Unknown/PTA  Height:   Ht Readings from Last 1 Encounters:  12/08/17 _0  (1.626 m)    Weight:   Wt Readings from Last 1 Encounters:  12/12/17 195 lb 12.3 oz (88.8 kg)    Ideal Body Weight:  54.54 kg  BMI:  Body mass index is 33.6 kg/m.  Estimated Nutritional Needs:   Kcal:  1575-1830 (MSJ x 1.2-1.4)  Protein:  85-100 grams (1-1.2 grams/kg)  Fluid:  1.6-1.8 L/day (1 mL/kcal)  Willey Blade, MS, RD, LDN Office: [phone] Pager: [phone] After Hours/Weekend Pager: [phone]

## 2017-12-13 ENCOUNTER — Inpatient Hospital Stay: Payer: Medicare HMO

## 2017-12-13 DIAGNOSIS — J9601 Acute respiratory failure with hypoxia: Secondary | ICD-10-CM

## 2017-12-13 DIAGNOSIS — I5031 Acute diastolic (congestive) heart failure: Secondary | ICD-10-CM

## 2017-12-13 LAB — CBC
HEMATOCRIT: 36.7 % (ref 35.0–47.0)
Hemoglobin: 12.1 g/dL (ref 12.0–16.0)
MCH: 30.6 pg (ref 26.0–34.0)
MCHC: 32.8 g/dL (ref 32.0–36.0)
MCV: 93.1 fL (ref 80.0–100.0)
Platelets: 430 10*3/uL (ref 150–440)
RBC: 3.95 MIL/uL (ref 3.80–5.20)
RDW: 14.1 % (ref 11.5–14.5)
WBC: 11.1 10*3/uL — AB (ref 3.6–11.0)

## 2017-12-13 LAB — BASIC METABOLIC PANEL
ANION GAP: 10 (ref 5–15)
BUN: 17 mg/dL (ref 6–20)
CO2: 38 mmol/L — AB (ref 22–32)
Calcium: 8.7 mg/dL — ABNORMAL LOW (ref 8.9–10.3)
Chloride: 88 mmol/L — ABNORMAL LOW (ref 101–111)
Creatinine, Ser: 0.46 mg/dL (ref 0.44–1.00)
GFR calc Af Amer: >60 mL/min (ref >60)
GLUCOSE: 171 mg/dL — AB (ref 65–99)
POTASSIUM: 4.2 mmol/L (ref 3.5–5.1)
Sodium: 136 mmol/L (ref 135–145)

## 2017-12-13 LAB — BLOOD GAS, ARTERIAL
ACID-BASE EXCESS: 21.4 mmol/L — AB (ref 0.0–2.0)
BICARBONATE: 50.8 mmol/L — AB (ref 20.0–28.0)
FIO2: 0.52
O2 Saturation: 94.7 %
PCO2 ART: 84 mmHg — AB (ref 32.0–48.0)
PH ART: 7.39 (ref 7.350–7.450)
Patient temperature: 37
pO2, Arterial: 75 mmHg — ABNORMAL LOW (ref 83.0–108.0)

## 2017-12-13 LAB — GLUCOSE, CAPILLARY: Glucose-Capillary: 141 mg/dL — ABNORMAL HIGH (ref 65–99)

## 2017-12-13 LAB — MRSA PCR SCREENING: MRSA by PCR: NEGATIVE

## 2017-12-13 MED ORDER — ENOXAPARIN SODIUM 40 MG/0.4ML ~~LOC~~ SOLN
40.0000 mg | SUBCUTANEOUS | Status: DC
  Administered 2017-12-13 – 2017-12-18 (×6): 40 mg via SUBCUTANEOUS
  Filled 2017-12-13 (×6): qty 0.4

## 2017-12-13 MED ORDER — ACETAZOLAMIDE SODIUM 500 MG IJ SOLR
500.0000 mg | Freq: Once | INTRAMUSCULAR | Status: AC
  Administered 2017-12-13: 500 mg via INTRAVENOUS
  Filled 2017-12-13: qty 500

## 2017-12-13 MED ORDER — HYDROCHLOROTHIAZIDE 25 MG PO TABS
25.0000 mg | ORAL_TABLET | Freq: Every day | ORAL | Status: DC
  Administered 2017-12-13 – 2017-12-19 (×7): 25 mg via ORAL
  Filled 2017-12-13 (×7): qty 1

## 2017-12-13 MED ORDER — SENNOSIDES-DOCUSATE SODIUM 8.6-50 MG PO TABS
2.0000 | ORAL_TABLET | Freq: Two times a day (BID) | ORAL | Status: DC
  Administered 2017-12-13 – 2017-12-19 (×7): 2 via ORAL
  Filled 2017-12-13 (×11): qty 2

## 2017-12-13 MED ORDER — ALPRAZOLAM 0.5 MG PO TABS
0.5000 mg | ORAL_TABLET | Freq: Three times a day (TID) | ORAL | Status: DC
  Administered 2017-12-13 – 2017-12-19 (×18): 0.5 mg via ORAL
  Filled 2017-12-13 (×19): qty 1

## 2017-12-13 MED ORDER — SENNOSIDES-DOCUSATE SODIUM 8.6-50 MG PO TABS
2.0000 | ORAL_TABLET | Freq: Once | ORAL | Status: AC
  Administered 2017-12-13: 2 via ORAL

## 2017-12-13 NOTE — Progress Notes (Addendum)
[facility] [place] [facility]   PATIENT NAME: Candace Butler    MR#:  [id number]  DATE OF BIRTH:  24-Apr-1941  SUBJECTIVE:  Patient seen and evaluated today Has shortness of breath and wheezing Oxygen saturation dropped She was put on high flow oxygen at 6 L  still oxygen saturation is low Awake and respond to verbal commands  REVIEW OF SYSTEMS:   Review of Systems  Constitutional: Negative.   HENT: Negative.   Eyes: Negative.   Respiratory: Positive for cough, shortness of breath and wheezing.   Cardiovascular: Negative.   Gastrointestinal: Negative.   Genitourinary: Negative.   Musculoskeletal: Negative.   Skin: Negative.   Neurological: Negative.   Endo/Heme/Allergies: Negative.   Psychiatric/Behavioral: Negative.     DRUG ALLERGIES:   Allergies  Allergen Reactions  . Indomethacin   . Vicodin [Hydrocodone-Acetaminophen]     VITALS:  Blood pressure (!) 164/75, pulse 88, temperature 98.4 F (36.9 C), temperature source Oral, resp. rate (!) 24, height 5' (1.524 m), weight 89.2 kg (196 lb 9.6 oz), SpO2 100 %.  PHYSICAL EXAMINATION:  GENERAL:  77 y.o.-year-old patient lying in the bed on high flow oxygen EYES: Pupils equal, round, reactive to light and accommodation. No scleral icterus. Extraocular muscles intact.  HEENT: Head atraumatic, normocephalic.  Has ET tube NECK:  Supple, no jugular venous distention. No thyroid enlargement, no tenderness.  LUNGS:Decreased breath sounds bilaterally,  wheezing bilaterally. No use of accessory muscles of respiration CARDIOVASCULAR: S1, S2 normal. No murmurs, rubs, or gallops.  ABDOMEN: Soft, nontender, nondistended. Bowel sounds present. No organomegaly or mass.  EXTREMITIES: No pedal edema, cyanosis, or clubbing.  NEUROLOGIC: Alert awake oriented to time place and person Moves all extremities PSYCHIATRIC: Mood pleasant SKIN: No obvious rash, lesion, or ulcer.   Physical Exam LABORATORY PANEL:    CBC Recent Labs  Lab 12/12/17 0510  WBC 11.8*  HGB 11.5*  HCT 34.0*  PLT 447*   ------------------------------------------------------------------------------------------------------------------  Chemistries  Recent Labs  Lab 12/08/17 0740  12/10/17 0503  12/12/17 0510  NA 119*   < > 134*   < > 135  K 3.6   < > 3.8   < > 4.1  CL 67*   < > 91*   < > 93*  CO2 44*   < > 34*   < > 37*  GLUCOSE 161*   < > 122*   < > 101*  BUN 17   < > 24*   < > 18  CREATININE 0.50   < > 0.63   < > 0.54  CALCIUM 8.6*   < > 8.5*   < > 8.3*  MG 1.5*   < > 2.0  --   --   AST 36  --   --   --   --   ALT 25  --   --   --   --   ALKPHOS 58  --   --   --   --   BILITOT 0.6  --   --   --   --    < > = values in this interval not displayed.   ------------------------------------------------------------------------------------------------------------------  Cardiac Enzymes Recent Labs  Lab 12/08/17 1747 12/08/17 2344  TROPONINI 0.15* <0.03   ------------------------------------------------------------------------------------------------------------------  RADIOLOGY:  No results found.  ASSESSMENT AND PLAN:  *Acute respiratory failure We will start patient on BiPAP to bring saturations up versus high flow oxygen Transfer patient to stepdown unit for close monitoring Case was  discussed with intensivist on-call  *Hypercapnic respiratory failure Start patient on BiPAP if patient gets tired on high flow oxygen  *Acute COPD exacerbation Continue IV Solu-Medrol Nebulization treatments Continue IV cefepime and Zithromax antibiotics Echocardiogram shows stage I diastolic dysfunction  *Chronic benign essential hypertension Stable on current regiment  *Chronic obesity Most likely secondary to excess calories Lifestyle modification recommended  *Acute hyponatremia resolved  *Obesity Lifestyle modification recommended  *Anxiety disorder Continue oral Xanax  Discussed with critical  care attending plan of management  All the records are reviewed and case discussed with Care Management/Social Workerr. Management plans discussed with the patient, family and they are in agreement.  CODE STATUS:full  TOTAL CRITICAL CARE TIME TAKING CARE OF THIS PATIENT: 51 minutes.   POSSIBLE D/C IN 3 to 5 DAYS, DEPENDING ON CLINICAL CONDITION.   Ihor Austin M.D on 12/13/2017   Between 7am to 6pm - Pager - [phone]  After 6pm go to [email or web address] - password [email or web address]  [facility] [place]  Office  [phone]  CC: Primary care physician; Dalia Heading, MD  Note: This dictation was prepared with Dragon dictation along with smaller phrase technology. Any transcriptional errors that result from this process are unintentional.

## 2017-12-13 NOTE — Progress Notes (Signed)
   CHIEF COMPLAINT:   Chief Complaint  Patient presents with  . Respiratory Distress    Subjective  Patient recently admitted to ICU for resp failure  S/p extubation Patient found to be hypoxic on the gen med floor Patient had COPD with chronic elevated PCO2  The Hospital ist called me to transfer back to Step down for biPAP and high flow After reviewing ABG, patient has chronically elevated pco2 with near normal pH.  Patient with increased WOB and Dyspnea Placed on high flow [place] Patient with lower ext edema along with some crackles at bases. fio2 at 60%    I/O last 3 completed shifts: In: 590 [P.O.:240; IV Piggyback:350] Out: 3150 [Urine:3150] No intake/output data recorded.        Objective   Examination:  General exam:+ resp distress, increased WOB Respiratory system: +crackles HEENT: [place]/AT, PERRLA, no thrush, no stridor. Cardiovascular system: S1 & S2 heard, RRR. No JVD, murmurs, rubs, gallops or clicks. No pedal edema. Gastrointestinal system: Abdomen is nondistended, soft and nontender. No organomegaly or masses felt. Normal bowel sounds heard. Central nervous system: Alert and oriented. No focal neurological deficits. Extremities: Symmetric 5 x 5 power.+edema Skin: No rashes, lesions or ulcers Psychiatry: Judgement and insight appear normal. Mood & affect appropriate.   VITALS:  height is 5' (1.524 m) and weight is 196 lb 9.6 oz (89.2 kg). Her oral temperature is 98.4 F (36.9 C). Her blood pressure is 164/75 (abnormal) and her pulse is 88. Her respiration is 24 (abnormal) and oxygen saturation is 100%.   I personally reviewed Labs under Results section.       Assessment/Plan:  77 yo white female with acute resp distress with increased WOB, severe resp distress with progressive Dyspnea Findings to suggest acute diastolic heart failure with lower ext edema and increased WOB   1.continue BD therapy 2.continue iv abx 3.continue steroids 4.daimox 500  mg IV x 1   Code Status: Full  High risk for intubation   Critical Care Time devoted to patient care services described in this note is 36 minutes.   Overall, patient is critically ill, prognosis is guarded.   high risk for cardiac arrest and death.    Lucie Leather, M.D.  [facility]  Medical Director [facility] Medical Director [facility] Cardio-Pulmonary Department

## 2017-12-13 NOTE — Plan of Care (Addendum)
Patient consistently falling below 88% on 5L [place].  Increased to 6L by Respiratory - yet still not maintaining 88% or above. HR also tacky/afib in 120s-130s.  Placed on High Flow and xanax changed to 3x/day. ABGs drawn resulting in PCO2 84, O2 75, Acid/Base 21.4, Bicarb 50.8.  Dr. Truitt Merle of critical ABG results. Placing bed request to transfer patient to step down d/t needing bipap.  Patient and family informed.  Transferring to Stepdown, room 9. Report called, s/w Cleotilde Neer, RN

## 2017-12-13 NOTE — Care Management Important Message (Signed)
Important Message  Patient Details  Name: Candace Butler MRN: [id number] Date of Birth: Feb 19, 1941   Medicare Important Message Given:  Yes    Olegario Messier A Necie Wilcoxson 12/13/2017, 11:17 AM

## 2017-12-13 NOTE — Progress Notes (Signed)
Pharmacy Monitoring Consult:  Pharmacy consulted for electrolyte and constipation management in this 77 y.o. female admitted on 12/08/2017 with Respiratory Distress  Patient returned to ICU on 5/7 for increased work of breathing.   Patient ordered acetazolamide  IV x 1.   Labs:  Sodium (mmol/L)  Date Value  12/13/2017 136   Potassium (mmol/L)  Date Value  12/13/2017 4.2   Magnesium (mg/dL)  Date Value  16/05/9603 2.0   Phosphorus (mg/dL)  Date Value  54/04/8118 3.0   Calcium (mg/dL)  Date Value  14/78/2956 8.7 (L)   Albumin (g/dL)  Date Value  21/30/8657 3.8    Plan: Electrolytes: no replacement warranted. Will recheck electrolytes with am labs.   Constipation: patient has not had bowel movement this admission. Will order senna-docusate 2 tabs PO BID.   Pharmacy will continue to monitor and adjust per consult.   Vernie Vinciguerra L 12/13/2017 4:21 PM

## 2017-12-14 LAB — BASIC METABOLIC PANEL
Anion gap: 8 (ref 5–15)
BUN: 16 mg/dL (ref 6–20)
CHLORIDE: 86 mmol/L — AB (ref 101–111)
CO2: 41 mmol/L — ABNORMAL HIGH (ref 22–32)
Calcium: 8.8 mg/dL — ABNORMAL LOW (ref 8.9–10.3)
Creatinine, Ser: 0.52 mg/dL (ref 0.44–1.00)
GFR calc Af Amer: >60 mL/min (ref >60)
GFR calc non Af Amer: >60 mL/min (ref >60)
Glucose, Bld: 182 mg/dL — ABNORMAL HIGH (ref 65–99)
POTASSIUM: 3.9 mmol/L (ref 3.5–5.1)
SODIUM: 135 mmol/L (ref 135–145)

## 2017-12-14 MED ORDER — POLYETHYLENE GLYCOL 3350 17 G PO PACK
17.0000 g | PACK | Freq: Every day | ORAL | Status: DC
  Administered 2017-12-14: 17 g via ORAL
  Filled 2017-12-14 (×4): qty 1

## 2017-12-14 MED ORDER — IPRATROPIUM-ALBUTEROL 0.5-2.5 (3) MG/3ML IN SOLN
3.0000 mL | Freq: Four times a day (QID) | RESPIRATORY_TRACT | Status: DC
  Administered 2017-12-14 – 2017-12-18 (×16): 3 mL via RESPIRATORY_TRACT
  Filled 2017-12-14 (×16): qty 3

## 2017-12-14 NOTE — Progress Notes (Signed)
Pharmacy consulted for electrolyte replacement protocol:   Goal of therapy: Electrolytes within normal limits:  K 3.5 - 5.1 Corrected Ca 8.9 - 10.3 Phos 2.5 - 4.6 Mg 1.7 - 2.4   Assessment: Lab Results  Component Value Date   CREATININE 0.52 12/14/2017   BUN 16 12/14/2017   NA 135 12/14/2017   K 3.9 12/14/2017   CL 86 (L) 12/14/2017   CO2 41 (H) 12/14/2017    Plan: Labs WNL no changes, will follow up with AM labs    Luan Pulling, PharmD, MBA, [employer] Clinical Pharmacist [employer]

## 2017-12-14 NOTE — Progress Notes (Signed)
[facility] [place] [facility]   PATIENT NAME: Candace Butler    MR#:  [id number]  DATE OF BIRTH:  03-Jan-1941  SUBJECTIVE:  Patient seen and evaluated today Has decreased shortness of breath and wheezing On oxygen via nasal cannula Awake and respond to verbal commands No chest pain  REVIEW OF SYSTEMS:   Review of Systems  Constitutional: Negative.   HENT: Negative.   Eyes: Negative.   Respiratory: Positive for cough, shortness of breath and wheezing.   Cardiovascular: Negative.   Gastrointestinal: Negative.   Genitourinary: Negative.   Musculoskeletal: Negative.   Skin: Negative.   Neurological: Negative.   Endo/Heme/Allergies: Negative.   Psychiatric/Behavioral: Negative.     DRUG ALLERGIES:   Allergies  Allergen Reactions  . Indomethacin   . Vicodin [Hydrocodone-Acetaminophen]     VITALS:  Blood pressure (!) 151/84, pulse (!) 104, temperature 98.6 F (37 C), temperature source Oral, resp. rate 20, height 5' (1.524 m), weight 86.8 kg (191 lb 5.8 oz), SpO2 97 %.  PHYSICAL EXAMINATION:  GENERAL:  77 y.o.-year-old patient lying in the bed on high flow oxygen EYES: Pupils equal, round, reactive to light and accommodation. No scleral icterus. Extraocular muscles intact.  HEENT: Head atraumatic, normocephalic.  Has ET tube NECK:  Supple, no jugular venous distention. No thyroid enlargement, no tenderness.  LUNGS:Improved breath sounds bilaterally,  Decreased wheezing bilaterally. No use of accessory muscles of respiration CARDIOVASCULAR: S1, S2 normal. No murmurs, rubs, or gallops.  ABDOMEN: Soft, nontender, nondistended. Bowel sounds present. No organomegaly or mass.  EXTREMITIES: No pedal edema, cyanosis, or clubbing.  NEUROLOGIC: Alert awake oriented to time place and person Moves all extremities No gross deficits PSYCHIATRIC: Mood pleasant SKIN: No obvious rash, lesion, or ulcer.   Physical Exam LABORATORY PANEL:   CBC Recent Labs  Lab  12/13/17 1502  WBC 11.1*  HGB 12.1  HCT 36.7  PLT 430   ------------------------------------------------------------------------------------------------------------------  Chemistries  Recent Labs  Lab 12/08/17 0740  12/10/17 0503  12/14/17 0950  NA 119*   < > 134*   < > 135  K 3.6   < > 3.8   < > 3.9  CL 67*   < > 91*   < > 86*  CO2 44*   < > 34*   < > 41*  GLUCOSE 161*   < > 122*   < > 182*  BUN 17   < > 24*   < > 16  CREATININE 0.50   < > 0.63   < > 0.52  CALCIUM 8.6*   < > 8.5*   < > 8.8*  MG 1.5*   < > 2.0  --   --   AST 36  --   --   --   --   ALT 25  --   --   --   --   ALKPHOS 58  --   --   --   --   BILITOT 0.6  --   --   --   --    < > = values in this interval not displayed.   ------------------------------------------------------------------------------------------------------------------  Cardiac Enzymes Recent Labs  Lab 12/08/17 1747 12/08/17 2344  TROPONINI 0.15* <0.03   ------------------------------------------------------------------------------------------------------------------  RADIOLOGY:  Dg Chest Port 1 View  Result Date: 12/13/2017 CLINICAL DATA:  Respiratory failure EXAM: PORTABLE CHEST 1 VIEW COMPARISON:  Chest radiograph 12/10/2017 FINDINGS: Cardiomegaly with mild pulmonary edema. Small left pleural effusion. Unchanged cardiomegaly. Endotracheal tube has been removed.  IMPRESSION: Mild cardiomegaly and mild pulmonary edema. Electronically Signed   By: Deatra Robinson M.D.   On: 12/13/2017 16:14    ASSESSMENT AND PLAN:  *Acute on chronic respiratory failure Continue oxygen via nasal cannula Transfer patient to medical floor  *Hypercapnea Continue IV Solu-Medrol and nebulization treatments  *Acute COPD exacerbation resolving slowly Continue IV Solu-Medrol Nebulization treatments Off IV antibiotics Echocardiogram shows stage I diastolic dysfunction  *Chronic benign essential hypertension Stable on current regiment  *Chronic  obesity Most likely secondary to excess calories Lifestyle modification recommended  *Acute hyponatremia resolved  *Obesity Lifestyle modification recommended  *Anxiety disorder Continue oral Xanax  Discussed with critical care attending plan of management  All the records are reviewed and case discussed with Care Management/Social Workerr. Management plans discussed with the patient, family and they are in agreement.  CODE STATUS:full  TOTAL CRITICAL CARE TIME TAKING CARE OF THIS PATIENT: 51 minutes.   POSSIBLE D/C IN 3 to 5 DAYS, DEPENDING ON CLINICAL CONDITION.   Ihor Austin M.D on 12/14/2017   Between 7am to 6pm - Pager - [phone]  After 6pm go to [email or web address] - password [employer]  [facility]  Office  [phone]  CC: Primary care physician; Dalia Heading, MD  Note: This dictation was prepared with Dragon dictation along with smaller phrase technology. Any transcriptional errors that result from this process are unintentional.

## 2017-12-14 NOTE — Progress Notes (Signed)
Pt being transferred to room 211. Report called to [place], Charity fundraiser. Pt and belongings transferred to room 211 without incident.

## 2017-12-14 NOTE — Progress Notes (Signed)
Advanced care plan.  Purpose of the Encounter: CODE STATUS  Parties in Attendance: Patient Patient's Decision Capacity:Good Subjective/Patient's story: Presented for shortness of breath, wheezing and difficulty breathing Objective/Medical story Has acute on chronic respiratory failure with COPD exacerbation Goals of care determination:  Advanced directives discussed For now patient wants everything done which includes resuscitation and intubation and ventilator if the need arises CODE STATUS: Full code Time spent discussing advanced care planning: 16 minutes

## 2017-12-14 NOTE — Progress Notes (Signed)
   CHIEF COMPLAINT:   Chief Complaint  Patient presents with  . Respiratory Distress    Subjective  Patient recently admitted to ICU for resp failure  S/p extubation Patient found to be hypoxic on the gen med floor Patient had COPD with chronic elevated PCO2  Patient was transferred to the intensive care unit secondary to worsening respiratory failure. This morning is on heated high flow at 35% being weaned to nasal cannula. States that she is feeling much better  I/O last 3 completed shifts: In: 753.7 [P.O.:240; I.V.:513.7] Out: 3050 [Urine:3050] Total I/O In: -  Out: 700 [Urine:700]   Objective   Examination:  General exam: patient is awake, alert, oriented in no acute distress Respiratory system: some crackles appreciated HEENT: [place]/AT, PERRLA, no thrush, no stridor. Cardiovascular system: S1 & S2 heard, RRR. No JVD, murmurs, rubs, gallops or clicks. No pedal edema. Gastrointestinal system: Abdomen is nondistended, soft and nontender. No organomegaly or masses felt. Normal bowel sounds heard. Central nervous system: Alert and oriented. No focal neurological deficits. Extremities: Symmetric 5 x 5 power.+edema Skin: No rashes, lesions or ulcers   VITALS:  height is 5' (1.524 m) and weight is 191 lb 5.8 oz (86.8 kg). Her oral temperature is 98.8 F (37.1 C). Her blood pressure is 149/87 (abnormal) and her pulse is 82. Her respiration is 20 and oxygen saturation is 97%.    Assessment/Plan:   Respiratory failure. Improved overnight. Patient had 1400 mL diuresis. Has been changed to nasal cannula. Will monitor closely. She is continuing on Solu-Medrol, albuterol, Atrovent, budesonide. If remains stable on nasal cannula will consider floor transfer  [name], DO    Patient ID: Candace Butler, female   DOB: 04-Mar-1941, 77 y.o.   MRN: [id number]

## 2017-12-15 LAB — BASIC METABOLIC PANEL
Anion gap: 7 (ref 5–15)
BUN: 17 mg/dL (ref 6–20)
CHLORIDE: 84 mmol/L — AB (ref 101–111)
CO2: 46 mmol/L — AB (ref 22–32)
Calcium: 8.9 mg/dL (ref 8.9–10.3)
Creatinine, Ser: 0.51 mg/dL (ref 0.44–1.00)
GFR calc Af Amer: >60 mL/min (ref >60)
GFR calc non Af Amer: >60 mL/min (ref >60)
GLUCOSE: 111 mg/dL — AB (ref 65–99)
POTASSIUM: 4 mmol/L (ref 3.5–5.1)
SODIUM: 137 mmol/L (ref 135–145)

## 2017-12-15 LAB — MAGNESIUM: MAGNESIUM: 1.7 mg/dL (ref 1.7–2.4)

## 2017-12-15 MED ORDER — MAGNESIUM SULFATE 2 GM/50ML IV SOLN
2.0000 g | Freq: Once | INTRAVENOUS | Status: AC
  Administered 2017-12-15: 2 g via INTRAVENOUS
  Filled 2017-12-15: qty 50

## 2017-12-15 MED ORDER — ACETAZOLAMIDE 250 MG PO TABS
250.0000 mg | ORAL_TABLET | Freq: Two times a day (BID) | ORAL | Status: AC
  Administered 2017-12-15 (×2): 250 mg via ORAL
  Filled 2017-12-15 (×4): qty 1

## 2017-12-15 MED ORDER — FUROSEMIDE 40 MG PO TABS
60.0000 mg | ORAL_TABLET | Freq: Every day | ORAL | Status: DC
  Administered 2017-12-15 – 2017-12-19 (×5): 60 mg via ORAL
  Filled 2017-12-15 (×5): qty 1

## 2017-12-15 MED ORDER — PREDNISONE 20 MG PO TABS
40.0000 mg | ORAL_TABLET | Freq: Every day | ORAL | Status: DC
  Administered 2017-12-16 – 2017-12-19 (×4): 40 mg via ORAL
  Filled 2017-12-15 (×4): qty 2

## 2017-12-15 NOTE — Progress Notes (Signed)
Advance care planning  Purpose of Encounter COPD and CODE STATUS discussion  Parties in Attendance Patient  Patients Decisional capacity Patient is awake and alert.  Able to make decisions.  Discussed with patient regarding her acute hospitalization with COPD exacerbation.  Patient is requesting to go home.  She was treated with oxygen at home.  But she is visibly short of breath and continues to have exacerbation.  Advised the patient needs to stay in the hospital 1 more day.  She agrees. She does not have any advanced directives are healthcare power of attorney designated.  Encourage patient to have advanced directives in place. Discussed regarding CODE STATUS.  After explaining to patient regarding full code versus DNR patient is requesting that she wants to be DO NOT RESUSCITATE and DO NOT INTUBATE.  Orders entered.  Goals of care determination Has progressively worsening COPD .  Poor long-term prognosis.  Time spent-17 minutes

## 2017-12-15 NOTE — Progress Notes (Signed)
[facility] -  at [facility]   PATIENT NAME: Candace Butler    MR#:  [id number]  DATE OF BIRTH:  September 02, 1940  SUBJECTIVE:   Patient sitting in a chair.  On 3-1/2 L oxygen. Continues to have shortness of breath  REVIEW OF SYSTEMS:   Review of Systems  Constitutional: Negative.   HENT: Negative.   Eyes: Negative.   Respiratory: Positive for cough, shortness of breath and wheezing.   Cardiovascular: Negative.   Gastrointestinal: Negative.   Genitourinary: Negative.   Musculoskeletal: Negative.   Skin: Negative.   Neurological: Negative.   Endo/Heme/Allergies: Negative.   Psychiatric/Behavioral: Negative.     DRUG ALLERGIES:   Allergies  Allergen Reactions  . Indomethacin   . Vicodin [Hydrocodone-Acetaminophen]     VITALS:  Blood pressure (!) 160/84, pulse 92, temperature 99.2 F (37.3 C), resp. rate 20, height 5' (1.524 m), weight 82 kg (180 lb 12.8 oz), SpO2 95 %.  PHYSICAL EXAMINATION:  GENERAL:  77 y.o.-year-old patient lying in the bed on high flow oxygen EYES: Pupils equal, round, reactive to light and accommodation. No scleral icterus. Extraocular muscles intact.  HEENT: Head atraumatic, normocephalic.  Has ET tube NECK:  Supple, no jugular venous distention. No thyroid enlargement, no tenderness.  LUNGS: Decreased air entry bilaterally CARDIOVASCULAR: S1, S2 normal. No murmurs, rubs, or gallops.  ABDOMEN: Soft, nontender, nondistended. Bowel sounds present. No organomegaly or mass.  EXTREMITIES: No pedal edema, cyanosis, or clubbing.  NEUROLOGIC: Alert awake oriented to time place and person Moves all extremities PSYCHIATRIC: Mood pleasant SKIN: No obvious rash, lesion, or ulcer.   Physical Exam LABORATORY PANEL:   CBC Recent Labs  Lab 12/13/17 1502  WBC 11.1*  HGB 12.1  HCT 36.7  PLT 430   ------------------------------------------------------------------------------------------------------------------  Chemistries   Recent Labs  Lab 12/15/17 0500  NA 137  K 4.0  CL 84*  CO2 46*  GLUCOSE 111*  BUN 17  CREATININE 0.51  CALCIUM 8.9  MG 1.7   ------------------------------------------------------------------------------------------------------------------  Cardiac Enzymes Recent Labs  Lab 12/08/17 1747 12/08/17 2344  TROPONINI 0.15* <0.03   ------------------------------------------------------------------------------------------------------------------  RADIOLOGY:  Dg Chest Port 1 View  Result Date: 12/13/2017 CLINICAL DATA:  Respiratory failure EXAM: PORTABLE CHEST 1 VIEW COMPARISON:  Chest radiograph 12/10/2017 FINDINGS: Cardiomegaly with mild pulmonary edema. Small left pleural effusion. Unchanged cardiomegaly. Endotracheal tube has been removed. IMPRESSION: Mild cardiomegaly and mild pulmonary edema. Electronically Signed   By: Deatra Robinson M.D.   On: 12/13/2017 16:14    ASSESSMENT AND PLAN:  *Acute on chronic hypoxic and hypercapnic respiratory failure Continue oxygen via nasal cannula  *Acute COPD exacerbation Change to prednisone today Nebulization treatments Off IV antibiotics Echocardiogram shows stage I diastolic dysfunction  *Acute on chronic diastolic congestive heart failure We will start Lasix 60 mg oral daily.  First dose now.  *Chronic benign essential hypertension Stable on current regiment  *Acute hyponatremia resolved  *Anxiety disorder Continue oral Xanax  All the records are reviewed and case discussed with Care Management/Social Workerr. Management plans discussed with the patient, family and they are in agreement.  CODE STATUS: DNR  TOTAL TIME TAKING CARE OF THIS PATIENT:35 minutes.   Possible discharge in 1 to 2 days  Orie Fisherman M.D on 12/15/2017   Between 7am to 6pm - Pager - [phone]  After 6pm go to [email or web address] - password [email or web address]  [facility]  Office  [phone]  CC: Primary care physician; Dalia Heading, MD  Note: This  dictation was prepared with Dragon dictation along with smaller phrase technology. Any transcriptional errors that result from this process are unintentional.

## 2017-12-15 NOTE — Care Management (Signed)
PT consult pending.  Per Barbara Cower with [facility] patient qualifies for NIV.  Completed form has been placed on chart and needs to be signed by MD

## 2017-12-15 NOTE — Care Management Important Message (Signed)
Copy of signed IM left in patient's room.    

## 2017-12-15 NOTE — Progress Notes (Signed)
Pharmacy consulted for electrolyte replacement protocol:   Goal of therapy: Electrolytes within normal limits:  K 3.5 - 5.1 Corrected Ca 8.9 - 10.3 Phos 2.5 - 4.6 Mg >2  Assessment: Lab Results  Component Value Date   CREATININE 0.51 12/15/2017   BUN 17 12/15/2017   NA 137 12/15/2017   K 4.0 12/15/2017   CL 84 (L) 12/15/2017   CO2 46 (H) 12/15/2017   Magnesium (mg/dL)  Date Value  16/05/9603 1.7  ] Plan: 5/9 AM Mg 1.7, goal >2. Will replace with Magnesium 2g IV x 1 dose.   No other replacement needed at this time.   Gardner Candle, PharmD, BCPS Clinical Pharmacist 12/15/2017 7:24 AM

## 2017-12-16 LAB — BASIC METABOLIC PANEL
Anion gap: 10 (ref 5–15)
BUN: 20 mg/dL (ref 6–20)
CO2: 48 mmol/L — ABNORMAL HIGH (ref 22–32)
Calcium: 9.1 mg/dL (ref 8.9–10.3)
Chloride: 78 mmol/L — ABNORMAL LOW (ref 101–111)
Creatinine, Ser: 0.7 mg/dL (ref 0.44–1.00)
GFR calc Af Amer: >60 mL/min (ref >60)
GLUCOSE: 84 mg/dL (ref 65–99)
POTASSIUM: 3.2 mmol/L — AB (ref 3.5–5.1)
Sodium: 136 mmol/L (ref 135–145)

## 2017-12-16 LAB — MAGNESIUM: Magnesium: 1.8 mg/dL (ref 1.7–2.4)

## 2017-12-16 MED ORDER — MAGNESIUM OXIDE 400 (241.3 MG) MG PO TABS
400.0000 mg | ORAL_TABLET | Freq: Three times a day (TID) | ORAL | Status: AC
  Administered 2017-12-16 (×3): 400 mg via ORAL
  Filled 2017-12-16 (×3): qty 1

## 2017-12-16 MED ORDER — POTASSIUM CHLORIDE 20 MEQ PO PACK
40.0000 meq | PACK | Freq: Once | ORAL | Status: AC
  Administered 2017-12-16: 40 meq via ORAL
  Filled 2017-12-16: qty 2

## 2017-12-16 NOTE — Progress Notes (Signed)
[facility] -  at [facility]   PATIENT NAME: Candace Butler    MR#:  [id number]  DATE OF BIRTH:  [birth year]  SUBJECTIVE:   Just worked with physical therapy.  Saturations dropped to 85% with minimal ambulation on 4 L oxygen. Wears 3.5 L at home. Felt dizzy and had to sit down  REVIEW OF SYSTEMS:   Review of Systems  Constitutional: Negative.   HENT: Negative.   Eyes: Negative.   Respiratory: Positive for cough, shortness of breath and wheezing.   Cardiovascular: Negative.   Gastrointestinal: Negative.   Genitourinary: Negative.   Musculoskeletal: Negative.   Skin: Negative.   Neurological: Negative.   Endo/Heme/Allergies: Negative.   Psychiatric/Behavioral: Negative.    DRUG ALLERGIES:   Allergies  Allergen Reactions  . Indomethacin   . Vicodin [Hydrocodone-Acetaminophen]     VITALS:  Blood pressure 133/69, pulse 89, temperature 98.3 F (36.8 C), temperature source Oral, resp. rate 17, height 5' (1.524 m), weight 83.1 kg (183 lb 3.2 oz), SpO2 97 %.  PHYSICAL EXAMINATION:  GENERAL:  77 y.o.-year-old patient sitting in a chair.  Conversational dyspnea EYES: Pupils equal, round, reactive to light and accommodation. No scleral icterus. Extraocular muscles intact.  HEENT: Head atraumatic, normocephalic.  NECK:  Supple, no jugular venous distention. No thyroid enlargement, no tenderness.  LUNGS: Decreased air entry bilaterally CARDIOVASCULAR: S1, S2 normal. No murmurs, rubs, or gallops.  ABDOMEN: Soft, nontender, nondistended. Bowel sounds present. No organomegaly or mass.  EXTREMITIES: No pedal edema, cyanosis, or clubbing.  NEUROLOGIC: Alert awake oriented to time place and person PSYCHIATRIC: Mood pleasant SKIN: No obvious rash, lesion, or ulcer.   Physical Exam LABORATORY PANEL:   CBC Recent Labs  Lab 12/13/17 1502  WBC 11.1*  HGB 12.1  HCT 36.7  PLT 430    ------------------------------------------------------------------------------------------------------------------  Chemistries  Recent Labs  Lab 12/16/17 0428  NA 136  K 3.2*  CL 78*  CO2 48*  GLUCOSE 84  BUN 20  CREATININE 0.70  CALCIUM 9.1  MG 1.8   ------------------------------------------------------------------------------------------------------------------  Cardiac Enzymes No results for input(s): TROPONINI in the last 168 hours. ------------------------------------------------------------------------------------------------------------------  RADIOLOGY:  No results found.  ASSESSMENT AND PLAN:  *Acute on chronic hypoxic and hypercapnic respiratory failure Continue oxygen via nasal cannula  *Acute COPD exacerbation On prednisone Nebulization treatments Stopped IV antibiotics Echocardiogram shows stage I diastolic dysfunction  *Acute on chronic diastolic congestive heart failure We will start Lasix 60 mg oral daily.   I/os  *Chronic benign essential hypertension Stable on current regiment  *Acute hyponatremia resolved  *Anxiety disorder Continue oral Xanax  All the records are reviewed and case discussed with Care Management/Social Workerr. Management plans discussed with the patient, family and they are in agreement.  CODE STATUS: DNR  TOTAL TIME TAKING CARE OF THIS PATIENT:35 minutes.   Likely d/c in AM Home with [facility]  Orie Fisherman M.D on 12/16/2017   Between 7am to 6pm - Pager - [phone]  After 6pm go to [email or web address] - password [email or web address]  [facility]  Office  [phone]  CC: Primary care physician; Dalia Heading, MD  Note: This dictation was prepared with Dragon dictation along with smaller phrase technology. Any transcriptional errors that result from this process are unintentional.

## 2017-12-16 NOTE — Progress Notes (Signed)
Pharmacy consulted for electrolyte replacement protocol:   Goal of therapy: Electrolytes within normal limits:  K 3.5 - 5.1 Corrected Ca 8.9 - 10.3 Phos 2.5 - 4.6 Mg >2  Assessment: Lab Results  Component Value Date   CREATININE 0.70 12/16/2017   BUN 20 12/16/2017   NA 136 12/16/2017   K 3.2 (L) 12/16/2017   CL 78 (L) 12/16/2017   CO2 48 (H) 12/16/2017   Magnesium (mg/dL)  Date Value  16/05/9603 1.8  ] Plan: 5/9 AM Mg 1.8, K: 3.2  Will replace with MagOX  TID x 3 doses and KCL po x 1 dose.   Will recheck electrolytes with AM labs and continue to replace as needed.   Gardner Candle, PharmD, BCPS Clinical Pharmacist 12/16/2017 9:38 AM

## 2017-12-16 NOTE — Evaluation (Signed)
Physical Therapy Evaluation Patient Details Name: Candace Butler MRN: [id number] DOB: 1940-09-23 Today's Date: 12/16/2017   History of Present Illness  Pt is a 77 y/o F who presented with SOB with SpO2 in the 70s requiring BiPAP.  Pt subsequently intubated. Chest x-ray negative.  Pt's PMH includes COPD.      Clinical Impression  Pt admitted with above diagnosis. Pt currently with functional limitations due to the deficits listed below (see PT Problem List). Pt presents with significant weakness BUE and BLE.  She requires heavy min assist for sit>stand transfers.  Pt ambulated 15 ft but became dizzy with SpO2 dropping as low as 85% on 4L O2.  Instructed pt in energy conservation techniques and the benefit from upright posture.  Given pt's current mobility status, recommending SNF at d/c.   Pt will benefit from skilled PT to increase their independence and safety with mobility to allow discharge to the venue listed below.      Follow Up Recommendations SNF    Equipment Recommendations  3in1 (PT)    Recommendations for Other Services       Precautions / Restrictions Precautions Precautions: Fall;Other (comment) Precaution Comments: monitor O2, pulse Restrictions Weight Bearing Restrictions: No      Mobility  Bed Mobility               General bed mobility comments: Pt sitting EOB upon PT arrival and sitting in chair at end of session  Transfers Overall transfer level: Needs assistance Equipment used: Rolling walker (2 wheeled) Transfers: Sit to/from Stand Sit to Stand: Min assist;From elevated surface         General transfer comment: Elevated bed for ease with transfer. Pt requires a heavy min assist to boost to standing from bed x1 and from chair x1.  Pt demonstrates proper hand placement for stand>sit and well controlled descent.   Ambulation/Gait Ambulation/Gait assistance: Min assist Ambulation Distance (Feet): 15 Feet Assistive device: Rolling walker (2  wheeled) Gait Pattern/deviations: Decreased step length - left;Decreased step length - right;Trunk flexed Gait velocity: decreased Gait velocity interpretation: <1.31 ft/sec, indicative of household ambulator General Gait Details: Pt takes very short steps and reports tightness in Bil ankles.  After ambulating 10 ft the pt reports feeling dizzy and pt instructed to return to sit in chair.  Pt becomes anxious when backing up to chair saying, "help me" so this PT assisted with positioning of RW.   Stairs            Wheelchair Mobility    Modified Rankin (Stroke Patients Only)       Balance Overall balance assessment: Needs assistance Sitting-balance support: No upper extremity supported;Feet supported Sitting balance-Leahy Scale: Good     Standing balance support: No upper extremity supported;During functional activity Standing balance-Leahy Scale: Fair Standing balance comment: Pt able to stand briefly without UE support but relies on UE support for extended standing or dynamic activities                             Pertinent Vitals/Pain Pain Assessment: No/denies pain    Home Living Family/patient expects to be discharged to:: Private residence Living Arrangements: Alone Available Help at Discharge: Family(pt says she might be able to arrange 24/7 from family) Type of Home: House Home Access: Stairs to enter Entrance Stairs-Rails: Left Entrance Stairs-Number of Steps: 4 Home Layout: One level Home Equipment: Emergency planning/management officer - 4 wheels;Cane - single point  Prior Function Level of Independence: Needs assistance   Gait / Transfers Assistance Needed: Pt ambulates up to ~10 ft with rollator or SPC.  No falls in the past 6 months.    ADL's / Homemaking Assistance Needed: Daughter assists with tub transfer and with bathing once pt sitting on shower seat.  Daughter does the grocery shopping, cooking, cleaning, driving. Pt independent with dressing.          Hand Dominance        Extremity/Trunk Assessment   Upper Extremity Assessment Upper Extremity Assessment: (BUE strength grossly 3/5)    Lower Extremity Assessment Lower Extremity Assessment: (BLE strength grossly 3/5)       Communication   Communication: No difficulties  Cognition Arousal/Alertness: Awake/alert Behavior During Therapy: WFL for tasks assessed/performed Overall Cognitive Status: Within Functional Limits for tasks assessed                                        General Comments General comments (skin integrity, edema, etc.): SpO2 remains 89% on higher on 4L O2 when ambulating; however, once the pt sits her SpO2 drops to 85%.  Pt requires ~1 minute with pursed lip breathing to improve SpO2 to low to mid 90s.      Exercises Other Exercises Other Exercises: Discussed strategy to place chairs in each room in her home to allow a place to rest when ambulating in home Other Exercises: Discussed not pushing herself so far when ambulating that she "hits a wall" but to rather take breaks as she goes to avoid complete exhaustion or SOB Other Exercises: Seated scapular retraction x5 with demonstration and cues for technique and encouraged pt to continue practicing this throughout the day   Assessment/Plan    PT Assessment Patient needs continued PT services  PT Problem List Decreased strength;Decreased activity tolerance;Decreased mobility;Decreased balance;Decreased knowledge of use of DME;Decreased safety awareness;Cardiopulmonary status limiting activity;Decreased knowledge of precautions       PT Treatment Interventions DME instruction;Gait training;Stair training;Functional mobility training;Therapeutic activities;Therapeutic exercise;Balance training;Neuromuscular re-education;Patient/family education    PT Goals (Current goals can be found in the Care Plan section)  Acute Rehab PT Goals Patient Stated Goal: to improve breathing PT Goal  Formulation: With patient Time For Goal Achievement: 12/30/17 Potential to Achieve Goals: Good    Frequency Min 2X/week   Barriers to discharge Inaccessible home environment;Other (comment) Unsure of the amount of assist available    Co-evaluation               AM-PAC PT "6 Clicks" Daily Activity  Outcome Measure Difficulty turning over in bed (including adjusting bedclothes, sheets and blankets)?: Unable Difficulty moving from lying on back to sitting on the side of the bed? : Unable Difficulty sitting down on and standing up from a chair with arms (e.g., wheelchair, bedside commode, etc,.)?: Unable Help needed moving to and from a bed to chair (including a wheelchair)?: A Little Help needed walking in hospital room?: A Little Help needed climbing 3-5 steps with a railing? : A Lot 6 Click Score: 11    End of Session Equipment Utilized During Treatment: Gait belt;Oxygen Activity Tolerance: Patient tolerated treatment well;Treatment limited secondary to medical complications (Comment)(hypoxia) Patient left: in chair;with call bell/phone within reach;with chair alarm set Nurse Communication: Mobility status;Other (comment)(SpO2) PT Visit Diagnosis: Unsteadiness on feet (R26.81);Muscle weakness (generalized) (M62.81);Difficulty in walking, not elsewhere classified (R26.2)  Time: 1610-9604 PT Time Calculation (min) (ACUTE ONLY): 30 min   Charges:   PT Evaluation $PT Eval Low Complexity: 1 Low PT Treatments $Gait Training: 8-22 mins   PT G Codes:        Encarnacion Chu PT, DPT 12/16/2017, 10:50 AM

## 2017-12-16 NOTE — Clinical Social Work Note (Signed)
Clinical Social Work Assessment  Patient Details  Name: Candace Butler MRN: [id number] Date of Birth: [birth year]  Date of referral:  12/16/17               Reason for consult:  Facility Placement                Permission sought to share information with:  Case Manager, Family Supports Permission granted to share information::  Yes, Verbal Permission Granted  Name::        Agency::     Relationship::     Contact Information:     Housing/Transportation Living arrangements for the past 2 months:  Single Family Home Source of Information:  Patient Patient Interpreter Needed:  None Criminal Activity/Legal Involvement Pertinent to Current Situation/Hospitalization:  No - Comment as needed Significant Relationships:  Adult Children Lives with:  Self Do you feel safe going back to the place where you live?  Yes Need for family participation in patient care:  Yes (Comment)  Care giving concerns:  Patient lives alone and relies on her 4 adult children for assistance.    Social Worker assessment / plan:  CSW notified by PT that they are recommending SNF at discharge. Patient already told PT that she wants to go home. CSW met with patient and she again stated that she wants to go home. CSW explained the PT recommendations and patient stated that she will not go to a facility and she will go home. CSW asked patient what her plan will be if she goes home. Patient responded that she has 4 children that can help take care of her and she will not go to a SNF. CSW asked if patient would be open to having home health and she agreed to that. Patient also gave permission to CSW to speak to children regarding discharge plan. CSW contacted patient's daughter Candace Butler [phone]. CSW explained to daughter the PT recommendations and that patient is refusing to go to SNF or even consider it. Daughter is frustrated that patient is unwilling to consider SNF. Daughter stated that she and her siblings work  during the day and can not assist patient at home. CSW explained that patient will have home health set up but that we could not even look for a facility placement without patient consent. CSW suggested that patient hire a caregiver to help while children are working. Daughter stated that patient is unable to pay for that and family would not be able to help. CSW explained that without patient consent there is not another option at this point. Patient adamantly refuses to go to SNF so home with home health is the only thing we can do. Daughter is not happy but understands that we can not force patient. CSW notified RNCM of above.   Employment status:  Retired Nurse, adult PT Recommendations:  [place] / Referral to community resources:  [place]  Patient/Family's Response to care:  Patient expresses understanding of her prognosis but refuses to go to SNF   Patient/Family's Understanding of and Emotional Response to Diagnosis, Current Treatment, and Prognosis:  Family is supportive and want to help patient but they work full time and are not able to be with her 24/7.   Emotional Assessment Appearance:  Appears stated age Attitude/Demeanor/Rapport:  Apprehensive, Guarded Affect (typically observed):  Agitated, Blunt Orientation:  Oriented to Self, Oriented to Place, Oriented to  Time, Oriented to Situation Alcohol / Substance use:  Not Applicable Psych involvement (Current and /or in the community):  No (Comment)  Discharge Needs  Concerns to be addressed:  Discharge Planning Concerns Readmission within the last 30 days:  No Current discharge risk:  Dependent with Mobility, Lives alone Barriers to Discharge:  No Barriers Identified   Annamaria Boots, [place] 12/16/2017, 1:30 PM

## 2017-12-16 NOTE — Care Management (Addendum)
Requested signature on NIV referral that is in the paper chart.  At time of note, referral has not been signed. Chronic home 02 with [facility]. [facility] referral for [facility] if referral is signed. Patient is adamant she wants to discharge home and not pursue skilled nursing facility as recommended by physical therapy today.  She did not give permission for CSW to perform bed search.  She lives alone.  Her oxygen sats dropped to 85 % on 4 liters of oxygen today. She tells CM  she has four children and they all work during the day so support will be limited.  Says her daughter is upset that she wants to go home but says "I have been alone."  It is planned that patient will discharge tomorrow. Discussed with CSW that if for some reason patient does not discharge 5/11 or found not to be medically stable, may need to initiate bed search to have a plan A and plan B.  Current home health referral for RN PT OT Aide and SW has been accepted by [facility] and requested visit be made within 24 hours of discharge.  Patient says she has a walker.  Requested order for [facility] from attending and the orders for the home health.  Gave patient Med Alert brochure.  Patient is still wearing external catheter.  Spoke with charge nurse of need to discontinue because patient to discharge tomorrow. Informed has been on Lasix and caused patient to have to get up a lot.  Discussed that she will be going home and must be able to get up to toilet

## 2017-12-17 LAB — MAGNESIUM: Magnesium: 2 mg/dL (ref 1.7–2.4)

## 2017-12-17 LAB — CBC
HCT: 40.4 % (ref 35.0–47.0)
HEMOGLOBIN: 13.5 g/dL (ref 12.0–16.0)
MCH: 31.6 pg (ref 26.0–34.0)
MCHC: 33.4 g/dL (ref 32.0–36.0)
MCV: 94.4 fL (ref 80.0–100.0)
Platelets: 409 10*3/uL (ref 150–440)
RBC: 4.28 MIL/uL (ref 3.80–5.20)
RDW: 14.1 % (ref 11.5–14.5)
WBC: 11.4 10*3/uL — ABNORMAL HIGH (ref 3.6–11.0)

## 2017-12-17 LAB — BASIC METABOLIC PANEL
ANION GAP: 7 (ref 5–15)
BUN: 27 mg/dL — ABNORMAL HIGH (ref 6–20)
CALCIUM: 9.4 mg/dL (ref 8.9–10.3)
CO2: 48 mmol/L — AB (ref 22–32)
Chloride: 77 mmol/L — ABNORMAL LOW (ref 101–111)
Creatinine, Ser: 0.78 mg/dL (ref 0.44–1.00)
GFR calc Af Amer: >60 mL/min (ref >60)
GFR calc non Af Amer: >60 mL/min (ref >60)
GLUCOSE: 101 mg/dL — AB (ref 65–99)
POTASSIUM: 3.5 mmol/L (ref 3.5–5.1)
Sodium: 132 mmol/L — ABNORMAL LOW (ref 135–145)

## 2017-12-17 MED ORDER — POTASSIUM CHLORIDE CRYS ER 20 MEQ PO TBCR
40.0000 meq | EXTENDED_RELEASE_TABLET | Freq: Once | ORAL | Status: AC
  Administered 2017-12-17: 40 meq via ORAL
  Filled 2017-12-17: qty 2

## 2017-12-17 NOTE — Progress Notes (Signed)
[employer] [facility] - [place] at [facility]   PATIENT NAME: Candace Butler    MR#:  [id number]  DATE OF BIRTH:  February 02, 1941  SUBJECTIVE:  CHIEF COMPLAINT:   Chief Complaint  Patient presents with  . Respiratory Distress  Family meeting had this morning, the patient the patient's 2 daughters, patient's son-patient now wishes to be placed in skilled nursing facility, social work/case management working on disposition-tentatively planned for on Monday or Tuesday  REVIEW OF SYSTEMS:  CONSTITUTIONAL: No fever, fatigue or weakness.  EYES: No blurred or double vision.  EARS, NOSE, AND THROAT: No tinnitus or ear pain.  RESPIRATORY: No cough, shortness of breath, wheezing or hemoptysis.  CARDIOVASCULAR: No chest pain, orthopnea, edema.  GASTROINTESTINAL: No nausea, vomiting, diarrhea or abdominal pain.  GENITOURINARY: No dysuria, hematuria.  ENDOCRINE: No polyuria, nocturia,  HEMATOLOGY: No anemia, easy bruising or bleeding SKIN: No rash or lesion. MUSCULOSKELETAL: No joint pain or arthritis.   NEUROLOGIC: No tingling, numbness, weakness.  PSYCHIATRY: No anxiety or depression.   ROS  DRUG ALLERGIES:   Allergies  Allergen Reactions  . Indomethacin   . Vicodin [Hydrocodone-Acetaminophen]     VITALS:  Blood pressure 100/60, pulse (!) 101, temperature 97.7 F (36.5 C), temperature source Oral, resp. rate 18, height 5' (1.524 m), weight 83.1 kg (183 lb 3.2 oz), SpO2 95 %.  PHYSICAL EXAMINATION:  GENERAL:  77 y.o.-year-old patient lying in the bed with no acute distress.  EYES: Pupils equal, round, reactive to light and accommodation. No scleral icterus. Extraocular muscles intact.  HEENT: Head atraumatic, normocephalic. Oropharynx and nasopharynx clear.  NECK:  Supple, no jugular venous distention. No thyroid enlargement, no tenderness.  LUNGS: Normal breath sounds bilaterally, no wheezing, rales,rhonchi or crepitation. No use of accessory muscles of respiration.   CARDIOVASCULAR: S1, S2 normal. No murmurs, rubs, or gallops.  ABDOMEN: Soft, nontender, nondistended. Bowel sounds present. No organomegaly or mass.  EXTREMITIES: No pedal edema, cyanosis, or clubbing.  NEUROLOGIC: Cranial nerves II through XII are intact. Muscle strength 5/5 in all extremities. Sensation intact. Gait not checked.  PSYCHIATRIC: The patient is alert and oriented x 3.  SKIN: No obvious rash, lesion, or ulcer.   Physical Exam LABORATORY PANEL:   CBC Recent Labs  Lab 12/17/17 0519  WBC 11.4*  HGB 13.5  HCT 40.4  PLT 409   ------------------------------------------------------------------------------------------------------------------  Chemistries  Recent Labs  Lab 12/17/17 0519  NA 132*  K 3.5  CL 77*  CO2 48*  GLUCOSE 101*  BUN 27*  CREATININE 0.78  CALCIUM 9.4  MG 2.0   ------------------------------------------------------------------------------------------------------------------  Cardiac Enzymes No results for input(s): TROPONINI in the last 168 hours. ------------------------------------------------------------------------------------------------------------------  RADIOLOGY:  No results found.  ASSESSMENT AND PLAN:  *Acute on chronic hypoxic and hypercapnic respiratory failure Resolved At baseline O2 requirement of 3.5 L daily at home-currently on 4 L here Continue oxygen via nasal cannula  *Acute COPD exacerbation Resolved  Continue prednisone taper with breathing treatments as needed Treated with course of IV antibiotics  Echocardiogram shows stage I diastolic dysfunction  *Acute on chronic diastolic congestive heart failure Resolved Continue p.o. Lasix  *Chronic benign essential hypertension Stable on current regiment  *Acute hyponatremia  resolved  *Anxiety disorder Stable Continue oral Xanax   Disposition to skilled nursing facility-tentatively plan for on Monday/Tuesday and discussion with social worker   All  the records are reviewed and case discussed with Care Management/Social Workerr. Management plans discussed with the patient, family and they are in agreement.  CODE  STATUS: DNR  TOTAL TIME TAKING CARE OF THIS PATIENT: 35 minutes.     POSSIBLE D/C IN 2-3 DAYS, DEPENDING ON CLINICAL CONDITION.   Evelena Asa Salary M.D on 12/17/2017   Between 7am to 6pm - Pager - [phone]  After 6pm go to [email or web address] - password [employer]  [facility] [place]  Office  [phone]  CC: Primary care physician; Dalia Heading, MD  Note: This dictation was prepared with Dragon dictation along with smaller phrase technology. Any transcriptional errors that result from this process are unintentional.

## 2017-12-17 NOTE — Clinical Social Work Note (Signed)
The CSW received a verbal consult from the attending MD that the patient has given verbal permission to begin a bed search for SNF. CSW has done so and is following for discharge facilitation and to provide bed offers.  Argentina Ponder, MSW, Theresia Majors [phone]

## 2017-12-17 NOTE — NC FL2 (Signed)
[place] LEVEL OF CARE SCREENING TOOL     IDENTIFICATION  Patient Name: Candace Butler Birthdate: 24-Apr-1941 Sex: female Admission Date (Current Location): 12/08/2017  [place] and [place] Number:  Engineering geologist and Address:  [facility], [address], [place], [place] [address]      Provider Number: [id number]  Attending Physician Name and Address:  Gorden Harms, MD  Relative Name and Phone Number:  Kasandra Knudsen (Daughter) [phone] or Cobb,Robin (Daughter) [phone]  [phone]     Current Level of Care: Hospital Recommended Level of Care: [place] Prior Approval Number:    Date Approved/Denied:   PASRR Number: [id number]  Discharge Plan: SNF    Current Diagnoses: Patient Active Problem List   Diagnosis Date Noted  . Respiratory failure ([place]) 12/08/2017  . COPD exacerbation ([place]) 06/13/2017    Orientation RESPIRATION BLADDER Height & Weight     Self, Time, Situation, Place  O2(4L o2) Continent Weight: 183 lb 3.2 oz (83.1 kg) Height:  5' (152.4 cm)  BEHAVIORAL SYMPTOMS/MOOD NEUROLOGICAL BOWEL NUTRITION STATUS      Continent Diet(heart Healthy)  AMBULATORY STATUS COMMUNICATION OF NEEDS Skin   Extensive Assist Verbally Normal                       Personal Care Assistance Level of Assistance  Bathing, Feeding, Dressing Bathing Assistance: Limited assistance Feeding assistance: Independent Dressing Assistance: Limited assistance     Functional Limitations Info             SPECIAL CARE FACTORS FREQUENCY  PT (By licensed PT)     PT Frequency: Up to 5X per week              Contractures Contractures Info: Not present    Additional Factors Info  Code Status, Allergies, Psychotropic Code Status Info: DNR Allergies Info: Indomethacin, Vicodin Hydrocodone-acetaminophen Psychotropic Info: Xanax, Trazodone         Current Medications (12/17/2017):  This is the  current hospital active medication list Current Facility-Administered Medications  Medication Dose Route Frequency Provider Last Rate Last Dose  . acetaminophen (TYLENOL) tablet 650 mg  650 mg Oral Q4H PRN Salary, Montell D, MD   650 mg at 12/17/17 1348  . albuterol (PROVENTIL) (2.5 MG/3ML) 0.083% nebulizer solution 2.5 mg  2.5 mg Nebulization Q2H PRN Salary, Montell D, MD   2.5 mg at 12/11/17 1754  . ALPRAZolam Duanne Moron) tablet 0.5 mg  0.5 mg Oral TID Saundra Shelling, MD   0.5 mg at 12/17/17 0925  . aspirin EC tablet 81 mg  81 mg Oral Daily Cassandria Santee, MD   81 mg at 12/17/17 0925  . budesonide (PULMICORT) nebulizer solution 0.5 mg  0.5 mg Nebulization BID Salary, Montell D, MD   0.5 mg at 12/17/17 0837  . chlorhexidine gluconate (MEDLINE KIT) (PERIDEX) 0.12 % solution 15 mL  15 mL Mouth Rinse BID Hillary Bow, MD   15 mL at 12/16/17 2228  . enalapril (VASOTEC) tablet 10 mg  10 mg Oral Daily Soyla Murphy, Maged, MD   10 mg at 12/17/17 1348  . enoxaparin (LOVENOX) injection 40 mg  40 mg Subcutaneous Q24H Flora Lipps, MD   40 mg at 12/16/17 2227  . feeding supplement (ENSURE ENLIVE) (ENSURE ENLIVE) liquid 237 mL  237 mL Oral BID BM Pyreddy, Pavan, MD   237 mL at 12/17/17 0925  . furosemide (LASIX) tablet 60 mg  60 mg Oral Daily Hillary Bow, MD  60 mg at 12/17/17 1348  . hydrochlorothiazide (HYDRODIURIL) tablet 25 mg  25 mg Oral Daily Pyreddy, Reatha Harps, MD   25 mg at 12/17/17 1348  . ipratropium-albuterol (DUONEB) 0.5-2.5 (3) MG/3ML nebulizer solution 3 mL  3 mL Nebulization Q6H Pyreddy, Pavan, MD   3 mL at 12/17/17 0837  . multivitamin with minerals tablet 1 tablet  1 tablet Oral Daily Saundra Shelling, MD   1 tablet at 12/17/17 0925  . ondansetron (ZOFRAN) injection 4 mg  4 mg Intravenous Q6H PRN Salary, Montell D, MD   4 mg at 12/14/17 1247  . polyethylene glycol (MIRALAX / GLYCOLAX) packet 17 g  17 g Oral Daily Conforti, John, DO   17 g at 12/14/17 1020  . predniSONE (DELTASONE) tablet 40 mg  40 mg  Oral Q breakfast Hillary Bow, MD   40 mg at 12/17/17 0844  . senna-docusate (Senokot-S) tablet 2 tablet  2 tablet Oral BID Saundra Shelling, MD   2 tablet at 12/16/17 2227  . traZODone (DESYREL) tablet 50 mg  50 mg Oral QHS PRN Awilda Bill, NP         Discharge Medications: Please see discharge summary for a list of discharge medications.  Relevant Imaging Results:  Relevant Lab Results:   Additional Information SS# [id number]  Zettie Pho, LCSW

## 2017-12-17 NOTE — Progress Notes (Addendum)
Manual blood pressure is 100/60. Paged Dr. Katheren Shams to ask about administration of ordered meds Vasotec , Lasix 60 mg. And HCTZ 25 mg.  Patient declined to take Miralax and Senna.

## 2017-12-17 NOTE — Progress Notes (Signed)
Pharmacy consulted for electrolyte replacement protocol:   Goal of therapy: Electrolytes within normal limits:  K 3.5 - 5.1 Corrected Ca 8.9 - 10.3 Phos 2.5 - 4.6 Mg >2  Assessment: Lab Results  Component Value Date   CREATININE 0.78 12/17/2017   BUN 27 (H) 12/17/2017   NA 132 (L) 12/17/2017   K 3.5 12/17/2017   CL 77 (L) 12/17/2017   CO2 48 (H) 12/17/2017   Magnesium (mg/dL)  Date Value  14/78/2956 2.0  ] Plan: 5/9 AM Mg 2.0, K: 3.5  Patient receiving Lasix   QD  Will order KCL PO x 1 dose. Base K+ level tomorrow,  Will plan on ordering scheduled KCL dose with Lasix. .   Will recheck electrolytes with AM labs and continue to replace as needed.   Gardner Candle, PharmD, BCPS Clinical Pharmacist 12/17/2017 8:06 AM

## 2017-12-18 MED ORDER — IPRATROPIUM-ALBUTEROL 0.5-2.5 (3) MG/3ML IN SOLN
3.0000 mL | Freq: Three times a day (TID) | RESPIRATORY_TRACT | Status: DC
  Administered 2017-12-19 (×2): 3 mL via RESPIRATORY_TRACT
  Filled 2017-12-18 (×2): qty 3

## 2017-12-18 MED ORDER — CARVEDILOL 6.25 MG PO TABS
3.1250 mg | ORAL_TABLET | Freq: Two times a day (BID) | ORAL | Status: DC
  Administered 2017-12-18 – 2017-12-19 (×3): 3.125 mg via ORAL
  Filled 2017-12-18 (×3): qty 1

## 2017-12-18 NOTE — Progress Notes (Signed)
[employer] [facility] - [place] at [facility]   PATIENT NAME: Candace Butler    MR#:  [id number]  DATE OF BIRTH:  Aug 15, 1940  SUBJECTIVE:  CHIEF COMPLAINT:   Chief Complaint  Patient presents with  . Respiratory Distress  Patient now wishes to be full code, daughter at bedside, no events overnight per nursing staff  REVIEW OF SYSTEMS:  CONSTITUTIONAL: No fever, fatigue or weakness.  EYES: No blurred or double vision.  EARS, NOSE, AND THROAT: No tinnitus or ear pain.  RESPIRATORY: No cough, shortness of breath, wheezing or hemoptysis.  CARDIOVASCULAR: No chest pain, orthopnea, edema.  GASTROINTESTINAL: No nausea, vomiting, diarrhea or abdominal pain.  GENITOURINARY: No dysuria, hematuria.  ENDOCRINE: No polyuria, nocturia,  HEMATOLOGY: No anemia, easy bruising or bleeding SKIN: No rash or lesion. MUSCULOSKELETAL: No joint pain or arthritis.   NEUROLOGIC: No tingling, numbness, weakness.  PSYCHIATRY: No anxiety or depression.   ROS  DRUG ALLERGIES:   Allergies  Allergen Reactions  . Indomethacin   . Vicodin [Hydrocodone-Acetaminophen]     VITALS:  Blood pressure (!) 121/52, pulse (!) 105, temperature 98.2 F (36.8 C), temperature source Oral, resp. rate 20, height 5' (1.524 m), weight 76 kg (167 lb 9.6 oz), SpO2 98 %.  PHYSICAL EXAMINATION:  GENERAL:  77 y.o.-year-old patient lying in the bed with no acute distress.  EYES: Pupils equal, round, reactive to light and accommodation. No scleral icterus. Extraocular muscles intact.  HEENT: Head atraumatic, normocephalic. Oropharynx and nasopharynx clear.  NECK:  Supple, no jugular venous distention. No thyroid enlargement, no tenderness.  LUNGS: Normal breath sounds bilaterally, no wheezing, rales,rhonchi or crepitation. No use of accessory muscles of respiration.  CARDIOVASCULAR: S1, S2 normal. No murmurs, rubs, or gallops.  ABDOMEN: Soft, nontender, nondistended. Bowel sounds present. No organomegaly or mass.   EXTREMITIES: No pedal edema, cyanosis, or clubbing.  NEUROLOGIC: Cranial nerves II through XII are intact. Muscle strength 5/5 in all extremities. Sensation intact. Gait not checked.  PSYCHIATRIC: The patient is alert and oriented x 3.  SKIN: No obvious rash, lesion, or ulcer.   Physical Exam LABORATORY PANEL:   CBC Recent Labs  Lab 12/17/17 0519  WBC 11.4*  HGB 13.5  HCT 40.4  PLT 409   ------------------------------------------------------------------------------------------------------------------  Chemistries  Recent Labs  Lab 12/17/17 0519  NA 132*  K 3.5  CL 77*  CO2 48*  GLUCOSE 101*  BUN 27*  CREATININE 0.78  CALCIUM 9.4  MG 2.0   ------------------------------------------------------------------------------------------------------------------  Cardiac Enzymes No results for input(s): TROPONINI in the last 168 hours. ------------------------------------------------------------------------------------------------------------------  RADIOLOGY:  No results found.  ASSESSMENT AND PLAN:  *Acute on chronic hypoxic and hypercapnic respiratory failure Resolved At baseline O2 requirement of 3.5 L daily at home-currently on 4 L here Continue oxygen via nasal cannula  *Acute COPD exacerbation Resolved  Continue prednisone taper with breathing treatments as needed Treated with course of IV antibiotics  Echocardiogram shows stage I diastolic dysfunction  *Acute on chronic diastolic congestive heart failure Resolved Continue p.o. Lasix  *Chronic benign essential hypertension Stable on current regiment  *Acute hyponatremia  resolved  *Anxiety disorder Stable Continue oral Xanax  Disposition to skilled nursing facility when bed is ready   All the records are reviewed and case discussed with Care Management/Social Workerr. Management plans discussed with the patient, family and they are in agreement.  CODE STATUS: full  TOTAL TIME TAKING CARE  OF THIS PATIENT: 35 minutes.     POSSIBLE D/C IN 1-2 DAYS, DEPENDING ON  CLINICAL CONDITION.   Evelena Asa Rhonin Trott M.D on 12/18/2017   Between 7am to 6pm - Pager - [phone]  After 6pm go to [email or web address] - password [employer]  [facility] [place] [facility]  Office  [phone]  CC: Primary care physician; Dalia Heading, MD  Note: This dictation was prepared with Dragon dictation along with smaller phrase technology. Any transcriptional errors that result from this process are unintentional.

## 2017-12-18 NOTE — Progress Notes (Signed)
Family Meeting Note  Advance Directive:yes  Today a meeting took place with the Patient w/ daughter.  Patient is able to participate  The following clinical team members were present during this meeting:MD  The following were discussed:Patient's diagnosis: Patient now wishes to be full code/change monitoring, heart failure, acute on chronic respiratory failure, obesity, deconditioning, Patient's progosis: Unable to determine and Goals for treatment: Full Code  Additional follow-up to be provided: prn  Time spent during discussion:20 minutes  Bertrum Sol, MD

## 2017-12-19 MED ORDER — PREDNISONE 10 MG PO TABS: 40.0000 mg | ORAL_TABLET | Freq: Every day | ORAL | 0 refills | Status: DC

## 2017-12-19 MED ORDER — ASPIRIN 81 MG PO TBEC: 81.0000 mg | DELAYED_RELEASE_TABLET | Freq: Every day | ORAL | 0 refills | Status: DC

## 2017-12-19 MED ORDER — ENSURE ENLIVE PO LIQD: 237.0000 mL | Freq: Two times a day (BID) | ORAL | 12 refills | Status: DC

## 2017-12-19 MED ORDER — CARVEDILOL 3.125 MG PO TABS: 3.1250 mg | ORAL_TABLET | Freq: Two times a day (BID) | ORAL | 0 refills | Status: DC

## 2017-12-19 MED ORDER — FUROSEMIDE 20 MG PO TABS: 60.0000 mg | ORAL_TABLET | Freq: Every day | ORAL | 0 refills | Status: DC

## 2017-12-19 MED ORDER — TRAZODONE HCL 50 MG PO TABS: 50.0000 mg | ORAL_TABLET | Freq: Every evening | ORAL | 0 refills | Status: DC | PRN

## 2017-12-19 MED ORDER — ADULT MULTIVITAMIN W/MINERALS CH: 1.0000 | ORAL_TABLET | Freq: Every day | ORAL | 0 refills | Status: DC

## 2017-12-19 MED ORDER — ALPRAZOLAM 0.5 MG PO TABS: 0.5000 mg | ORAL_TABLET | Freq: Three times a day (TID) | ORAL | 0 refills | Status: DC | PRN

## 2017-12-19 MED ORDER — ENALAPRIL-HYDROCHLOROTHIAZIDE 10-25 MG PO TABS: 1.0000 | ORAL_TABLET | Freq: Every day | ORAL | 0 refills | Status: DC

## 2017-12-19 NOTE — Progress Notes (Signed)
Physical Therapy Treatment Patient Details Name: Candace Butler MRN: [id number] DOB: 04-07-1941 Today's Date: 12/19/2017    History of Present Illness Pt is a 77 y/o F who presented with SOB with SpO2 in the 70s requiring BiPAP.  Pt subsequently intubated. Chest x-ray negative.  Pt's PMH includes COPD.      PT Comments    Candace Butler made modest progress with mobility this session.  Pt was able to ambulate 25 ft this session, but remained limited by fatigue.  She continues to require min assist to boost to standing.   SpO2 down as low as 86% on 4L O2 after supine>sit.  Pt practiced her pursed lip breathing sitting EOB with SpO2 improving to low 90s.  SpO2 remained 88% and above while ambulating.  Pulse up to 104 when ambulating.  Follow up recommendations remain appropriate.    Follow Up Recommendations  SNF     Equipment Recommendations  3in1 (PT)    Recommendations for Other Services       Precautions / Restrictions Precautions Precautions: Fall;Other (comment) Precaution Comments: monitor O2, pulse Restrictions Weight Bearing Restrictions: No    Mobility  Bed Mobility Overal bed mobility: Needs Assistance Bed Mobility: Supine to Sit     Supine to sit: Min guard;HOB elevated     General bed mobility comments: Increased effort and time but no physical assist required  Transfers Overall transfer level: Needs assistance Equipment used: Rolling walker (2 wheeled) Transfers: Sit to/from Stand Sit to Stand: Min assist         General transfer comment: Assist provided to boost to standing.  Cues for proper hand placement.    Ambulation/Gait Ambulation/Gait assistance: Min guard Ambulation Distance (Feet): 25 Feet Assistive device: Rolling walker (2 wheeled) Gait Pattern/deviations: Decreased step length - left;Decreased step length - right;Trunk flexed Gait velocity: decreased   General Gait Details: Pt initially reports tightness in BLEs and moving very slowly and  with hesitancy.  However, pt becomes more steady and was able to ambulate a total of 25 ft this session with close min guard assist as pt fatigues quickly.     Stairs             Wheelchair Mobility    Modified Rankin (Stroke Patients Only)       Balance Overall balance assessment: Needs assistance Sitting-balance support: No upper extremity supported;Feet supported Sitting balance-Leahy Scale: Good     Standing balance support: No upper extremity supported;During functional activity Standing balance-Leahy Scale: Fair Standing balance comment: Pt able to stand briefly without UE support but relies on UE support for extended standing or dynamic activities                            Cognition Arousal/Alertness: Awake/alert Behavior During Therapy: WFL for tasks assessed/performed Overall Cognitive Status: Within Functional Limits for tasks assessed                                        Exercises General Exercises - Upper Extremity Shoulder Flexion: AROM;Both;10 reps;Supine General Exercises - Lower Extremity Ankle Circles/Pumps: AROM;Both;10 reps;Supine Straight Leg Raises: Both;10 reps;Supine Hip Flexion/Marching: Both;10 reps;Seated Other Exercises Other Exercises: Supine scapular retraction x5 with demonstration and cues for technique and encouraged pt to continue practicing this throughout the day    General Comments General comments (skin integrity, edema, etc.): SpO2 down  as low as 86% on 4L O2 after supine>sit.  Pt practiced her pursed lip breathing sitting EOB with SpO2 improving to low 90s.  SpO2 remained 88% and above while ambulating.  Pulse up to 104 when ambulating.       Pertinent Vitals/Pain Pain Assessment: No/denies pain    Home Living                      Prior Function            PT Goals (current goals can now be found in the care plan section) Acute Rehab PT Goals Patient Stated Goal: to improve  breathing PT Goal Formulation: With patient Time For Goal Achievement: 12/30/17 Potential to Achieve Goals: Good Progress towards PT goals: Progressing toward goals    Frequency    Min 2X/week      PT Plan Current plan remains appropriate    Co-evaluation              AM-PAC PT "6 Clicks" Daily Activity  Outcome Measure  Difficulty turning over in bed (including adjusting bedclothes, sheets and blankets)?: Unable Difficulty moving from lying on back to sitting on the side of the bed? : Unable Difficulty sitting down on and standing up from a chair with arms (e.g., wheelchair, bedside commode, etc,.)?: Unable Help needed moving to and from a bed to chair (including a wheelchair)?: A Little Help needed walking in hospital room?: A Little Help needed climbing 3-5 steps with a railing? : A Lot 6 Click Score: 11    End of Session Equipment Utilized During Treatment: Gait belt;Oxygen Activity Tolerance: Patient limited by fatigue Patient left: in chair;with call bell/phone within reach;with chair alarm set;with family/visitor present;with nursing/sitter in room Nurse Communication: Mobility status;Other (comment)(SpO2) PT Visit Diagnosis: Unsteadiness on feet (R26.81);Muscle weakness (generalized) (M62.81);Difficulty in walking, not elsewhere classified (R26.2)     Time: 1610-9604 PT Time Calculation (min) (ACUTE ONLY): 20 min  Charges:  $Gait Training: 8-22 mins                    G Codes:       Encarnacion Chu PT, DPT 12/19/2017, 2:16 PM

## 2017-12-19 NOTE — Care Management Important Message (Signed)
Copy of signed IM left in patient's room.    

## 2017-12-19 NOTE — Clinical Social Work Note (Signed)
CSW met with patient and her daughter, Jackelyn Poling, this morning and discussed the discharge options. Discharge options were as follows: return home with home health today, go to a facility and private pay until the insurance auth is determined, or go home until the insurance Josem Kaufmann is determined. Patient is ready for discharge. Patient and daughter have decided to pay out of pocket for [facility] (they offered a bed) and have made these arrangements. CSW has notified the physician. Discharge information sent. [facility] is aware and in agreement with the decided plan. Shela Leff MSW,LCSW [phone]

## 2017-12-19 NOTE — Clinical Social Work Placement (Signed)
   CLINICAL SOCIAL WORK PLACEMENT  NOTE  Date:  12/19/2017  Patient Details  Name: Candace Butler MRN: [id number] Date of Birth: [birth year]  Clinical Social Work is seeking post-discharge placement for this patient at the Skilled  Nursing Facility level of care (*CSW will initial, date and re-position this form in  chart as items are completed):      Patient/family provided with [facility] Clinical Social Work Department's list of facilities offering this level of care within the geographic area requested by the patient (or if unable, by the patient's family).  Yes   Patient/family informed of their freedom to choose among providers that offer the needed level of care, that participate in Medicare, Medicaid or managed care program needed by the patient, have an available bed and are willing to accept the patient.  Yes   Patient/family informed of [facility]'s ownership interest in [facility] and [facility], as well as of the fact that they are under no obligation to receive care at these facilities.  PASRR submitted to EDS on       PASRR number received on       Existing PASRR number confirmed on       FL2 transmitted to all facilities in geographic area requested by pt/family on 12/17/17     FL2 transmitted to all facilities within larger geographic area on       Patient informed that his/her managed care company has contracts with or will negotiate with certain facilities, including the following:        Yes   Patient/family informed of bed offers received.  Patient [facility] bed at [facility])     Physician recommends and patient chooses bed at [facility])    Patient to be transferred to ([facility]) on 12/19/17.  Patient to be transferred to facility by (EMS)     Patient family notified on 12/19/17 of transfer.  Name of family member notified:  (daughter: Debbie)     PHYSICIAN       Additional Comment:     _______________________________________________ [place] Spaniel, LCSW 12/19/2017, 1:51 PM

## 2017-12-19 NOTE — Care Management (Signed)
Jason with [facility] notified that patient to discharge to [facility].  CSW facilitating.  RNCM signing off.

## 2017-12-19 NOTE — Progress Notes (Signed)
[facility] [place] at [facility]   PATIENT NAME: Candace Butler    MR#:  [id number]  DATE OF BIRTH:  03-Jun-1941  SUBJECTIVE:  CHIEF COMPLAINT:   Chief Complaint  Patient presents with  . Respiratory Distress  Discussed case with the patient and the patient's daughter-they state that patient cannot be discharged home as this is unsafe/unable to care for person at home, case discussed with case management  REVIEW OF SYSTEMS:  CONSTITUTIONAL: No fever, fatigue or weakness.  EYES: No blurred or double vision.  EARS, NOSE, AND THROAT: No tinnitus or ear pain.  RESPIRATORY: No cough, shortness of breath, wheezing or hemoptysis.  CARDIOVASCULAR: No chest pain, orthopnea, edema.  GASTROINTESTINAL: No nausea, vomiting, diarrhea or abdominal pain.  GENITOURINARY: No dysuria, hematuria.  ENDOCRINE: No polyuria, nocturia,  HEMATOLOGY: No anemia, easy bruising or bleeding SKIN: No rash or lesion. MUSCULOSKELETAL: No joint pain or arthritis.   NEUROLOGIC: No tingling, numbness, weakness.  PSYCHIATRY: No anxiety or depression.   ROS  DRUG ALLERGIES:   Allergies  Allergen Reactions  . Indomethacin   . Vicodin [Hydrocodone-Acetaminophen]     VITALS:  Blood pressure (!) 142/70, pulse 95, temperature 98 F (36.7 C), temperature source Oral, resp. rate 20, height 5' (1.524 m), weight 75.2 kg (165 lb 11.2 oz), SpO2 97 %.  PHYSICAL EXAMINATION:  GENERAL:  77 y.o.-year-old patient lying in the bed with no acute distress.  EYES: Pupils equal, round, reactive to light and accommodation. No scleral icterus. Extraocular muscles intact.  HEENT: Head atraumatic, normocephalic. Oropharynx and nasopharynx clear.  NECK:  Supple, no jugular venous distention. No thyroid enlargement, no tenderness.  LUNGS: Normal breath sounds bilaterally, no wheezing, rales,rhonchi or crepitation. No use of accessory muscles of respiration.  CARDIOVASCULAR: S1, S2 normal. No murmurs, rubs, or  gallops.  ABDOMEN: Soft, nontender, nondistended. Bowel sounds present. No organomegaly or mass.  EXTREMITIES: No pedal edema, cyanosis, or clubbing.  NEUROLOGIC: Cranial nerves II through XII are intact. Muscle strength 5/5 in all extremities. Sensation intact. Gait not checked.  PSYCHIATRIC: The patient is alert and oriented x 3.  SKIN: No obvious rash, lesion, or ulcer.   Physical Exam LABORATORY PANEL:   CBC Recent Labs  Lab 12/17/17 0519  WBC 11.4*  HGB 13.5  HCT 40.4  PLT 409   ------------------------------------------------------------------------------------------------------------------  Chemistries  Recent Labs  Lab 12/17/17 0519  NA 132*  K 3.5  CL 77*  CO2 48*  GLUCOSE 101*  BUN 27*  CREATININE 0.78  CALCIUM 9.4  MG 2.0   ------------------------------------------------------------------------------------------------------------------  Cardiac Enzymes No results for input(s): TROPONINI in the last 168 hours. ------------------------------------------------------------------------------------------------------------------  RADIOLOGY:  No results found.  ASSESSMENT AND PLAN:  *Acute on chronic hypoxic and hypercapnic respiratory failure Resolved At baseline O2 requirement of 3.5 L daily at home-currently on 4 L here Continue oxygen via nasal cannula  *Acute COPD exacerbation Resolved  Continueprednisonetaper with breathing treatments as needed Treated with course of IV antibiotics Echocardiogram shows stage I diastolic dysfunction  *Acute on chronic diastolic congestive heart failure Resolved Continue p.o. Lasix  *Chronic benign essential hypertension Stable on current regiment  *Acute hyponatremia  resolved  *Anxiety disorder Stable Continue oral Xanax  *Acute on chronic deconditioning Awaiting placement, family/patient stating that they cannot take care of patient at home/unable to do self-care, discussed case with case  management   Disposition to skilled nursing facility versus home with home health services when plan can be achieve/agreed on between family and case management,  all questions answered   All the records are reviewed and case discussed with Care Management/Social Workerr. Management plans discussed with the patient, family and they are in agreement.  CODE STATUS: full  TOTAL TIME TAKING CARE OF THIS PATIENT: 35 minutes.     POSSIBLE D/C IN 0-3 DAYS, DEPENDING ON CLINICAL CONDITION.   Evelena Asa Hannie Shoe M.D on 12/19/2017   Between 7am to 6pm - Pager - [phone]  After 6pm go to [email or web address] - password [email or web address]  [facility]  Office  [phone]  CC: Primary care physician; Dalia Heading, MD  Note: This dictation was prepared with Dragon dictation along with smaller phrase technology. Any transcriptional errors that result from this process are unintentional.

## 2017-12-19 NOTE — Progress Notes (Signed)
Nutrition Follow-up  DOCUMENTATION CODES:   Obesity unspecified  INTERVENTION:   Continue Ensure Enlive po BID, each supplement provides 350 kcal and 20 grams of protein.  Magic cup TID with meals, each supplement provides 290 kcal and 9 grams of protein  Provide daily MVI.  Will change diet from heart healthy to 2 gram sodium as a heart healthy diet restricts protein also.   NUTRITION DIAGNOSIS:   Inadequate oral intake related to decreased appetite as evidenced by per patient/family report.  GOAL:   Patient will meet greater than or equal to 90% of their needs  Progressing.  MONITOR:   PO intake, Supplement acceptance, Labs, Weight trends, I & O's  ASSESSMENT:   77 years old lady with history of chronic respiratory failure on home O2 3 L/min nasal cannula, COPD and hypertension.  Patient is admitted with acute on chronic respiratory failure, COPD exacerbation, did not respond to BiPAP and had to be intubated because of hypercarbic respiratory failure.   Pt with improved appetite and oral intake; pt eating 75-100% of meals and drinking some Ensure. Per chart, pt with ~20lbs wt loss since admit likely r/t fluid changes. Pt is now back down to her baseline weight.   Medications reviewed and include: Xanax, aspirin, lovenox, lasix, hydrochlorothiazide, MVI, miralax, prednisone, senokot  Labs reviewed:   Diet Order:   Diet Order           Diet Heart Room service appropriate? Yes; Fluid consistency: Thin  Diet effective now         EDUCATION NEEDS:   No education needs have been identified at this time  Skin:  Skin Assessment: Reviewed RN Assessment  Last BM:  5/12- type 4  Height:   Ht Readings from Last 1 Encounters:  12/12/17 5' (1.524 m)    Weight:   Wt Readings from Last 1 Encounters:  12/19/17 165 lb 11.2 oz (75.2 kg)    Ideal Body Weight:  54.54 kg  BMI:  Body mass index is 32.36 kg/m.  Estimated Nutritional Needs:   Kcal:  1575-1830 (MSJ x  1.2-1.4)  Protein:  85-100 grams (1-1.2 grams/kg)  Fluid:  1.6-1.8 L/day (1 mL/kcal)  Betsey Holiday MS, RD, LDN Pager #- [phone] Office#- [phone] After Hours Pager: [phone]

## 2017-12-19 NOTE — Discharge Summary (Signed)
[facility] [place] at [facility]   PATIENT NAME: Candace Butler    MR#:  [id number]  DATE OF BIRTH:  09/01/1940  DATE OF ADMISSION:  12/08/2017 ADMITTING PHYSICIAN: Evelena Asa Sheronda Parran, MD  DATE OF DISCHARGE: No discharge date for patient encounter.  PRIMARY CARE PHYSICIAN: Dalia Heading, MD    ADMISSION DIAGNOSIS:  COPD exacerbation (HCC) [J44.1] Acute respiratory failure with hypoxia and hypercapnia (HCC) [J96.01, J96.02]  DISCHARGE DIAGNOSIS:  Active Problems:   Respiratory failure (HCC)   SECONDARY DIAGNOSIS:   Past Medical History:  Diagnosis Date  . Asthma   . COPD (chronic obstructive pulmonary disease) (HCC)   . Hypertension     HOSPITAL COURSE:  *Acute on chronic hypoxic and hypercapnic respiratory failure Resolved At baseline O2 requirement of 3.5 L daily at home-currently on 4 L here Continue oxygen via nasal cannula  *Acute COPD exacerbation Resolved  Continueprednisonetaper with breathing treatments as needed Treated with course of IV antibiotics Echocardiogram shows stage I diastolic dysfunction  *Acute on chronic diastolic congestive heart failure Resolved Continue p.o. Lasix, follow-up in congestive heart failure clinic status post discharge  *Chronic benign essential hypertension Stable on current regiment  *Acute hyponatremia  resolved  *Anxiety disorder Stable Continue oral Xanax  DISCHARGE CONDITIONS:  Stable, follow-up primary care provider in 2 weeks for reevaluation, discharge to skilled nursing facility for continued care/management  CONSULTS OBTAINED:    DRUG ALLERGIES:   Allergies  Allergen Reactions  . Indomethacin   . Vicodin [Hydrocodone-Acetaminophen]     DISCHARGE MEDICATIONS:   Allergies as of 12/19/2017      Reactions   Indomethacin    Vicodin [hydrocodone-acetaminophen]       Medication List    TAKE these medications   ADVAIR DISKUS 250-50 MCG/DOSE Aepb Generic drug:   Fluticasone-Salmeterol Take 1 puff 2 (two) times daily by mouth.   albuterol 108 (90 Base) MCG/ACT inhaler Commonly known as:  PROVENTIL HFA;VENTOLIN HFA Inhale 2 puffs every 6 (six) hours as needed into the lungs for wheezing or shortness of breath.   ALPRAZolam 0.5 MG tablet Commonly known as:  XANAX Take 0.25 tablets by mouth 2 (two) times daily as needed for anxiety. What changed:  Another medication with the same name was added. Make sure you understand how and when to take each.   ALPRAZolam 0.5 MG tablet Commonly known as:  XANAX Take 1 tablet (0.5 mg total) by mouth 3 (three) times daily as needed for anxiety. What changed:  You were already taking a medication with the same name, and this prescription was added. Make sure you understand how and when to take each.   aspirin 81 MG EC tablet Take 1 tablet (81 mg total) by mouth daily. Start taking on:  12/20/2017   carvedilol 3.125 MG tablet Commonly known as:  COREG Take 1 tablet (3.125 mg total) by mouth 2 (two) times daily with a meal.   enalapril-hydrochlorothiazide 10-25 MG tablet Commonly known as:  VASERETIC Take 1 tablet by mouth daily.   feeding supplement (ENSURE ENLIVE) Liqd Take 237 mLs by mouth 2 (two) times daily between meals.   furosemide 20 MG tablet Commonly known as:  LASIX Take 3 tablets (60 mg total) by mouth daily. Start taking on:  12/20/2017   multivitamin with minerals Tabs tablet Take 1 tablet by mouth daily. Start taking on:  12/20/2017   predniSONE 10 MG tablet Commonly known as:  DELTASONE Take 4 tablets (40 mg total) by mouth daily  with breakfast. Start taking on:  12/20/2017   tiotropium 18 MCG inhalation capsule Commonly known as:  SPIRIVA Place 1 capsule (18 mcg total) daily into inhaler and inhale.   traZODone 50 MG tablet Commonly known as:  DESYREL Take 1 tablet (50 mg total) by mouth at bedtime as needed for sleep.        DISCHARGE INSTRUCTIONS:  If you experience  worsening of your admission symptoms, develop shortness of breath, life threatening emergency, suicidal or homicidal thoughts you must seek medical attention immediately by calling 911 or calling your MD immediately  if symptoms less severe.  You Must read complete instructions/literature along with all the possible adverse reactions/side effects for all the Medicines you take and that have been prescribed to you. Take any new Medicines after you have completely understood and accept all the possible adverse reactions/side effects.   Please note  You were cared for by a hospitalist during your hospital stay. If you have any questions about your discharge medications or the care you received while you were in the hospital after you are discharged, you can call the unit and asked to speak with the hospitalist on call if the hospitalist that took care of you is not available. Once you are discharged, your primary care physician will handle any further medical issues. Please note that NO REFILLS for any discharge medications will be authorized once you are discharged, as it is imperative that you return to your primary care physician (or establish a relationship with a primary care physician if you do not have one) for your aftercare needs so that they can reassess your need for medications and monitor your lab values.    Today   CHIEF COMPLAINT:   Chief Complaint  Patient presents with  . Respiratory Distress    HISTORY OF PRESENT ILLNESS:  77 y.o. female with a known history per below which also includes chronic hypoxic respiratory failure on 3 L chronically at home, presenting with acute shortness of breath for 5 days, found by family to be in acute respiratory distress, brought to the emergency room via EMS, noted O2 saturation in the 70s, patient did require BiPAP on arrival-no improvement however in symptomatology, patient found to have acute respiratory acidosis on blood gas with PCO2 greater  than 100/pH 7.2 on venous blood gas and discussion with ED attending, chest x-ray was negative, white count normal, patient subsequently intubated, patient evaluated at the bedside, resting comfortably in bed, blood pressure was initially high, now systolically in the 90s post intubation, currently receiving IV fluids, daughters at the bedside, patient is now being admitted for acute on chronic hypoxic hypercapnic respiratory failure, acute asthma/COPD exacerbation.     VITAL SIGNS:  Blood pressure (!) 142/70, pulse 95, temperature 98 F (36.7 C), temperature source Oral, resp. rate 20, height 5' (1.524 m), weight 75.2 kg (165 lb 11.2 oz), SpO2 97 %.  I/O:    Intake/Output Summary (Last 24 hours) at 12/19/2017 1149 Last data filed at 12/19/2017 1019 Gross per 24 hour  Intake 480 ml  Output 2600 ml  Net -2120 ml    PHYSICAL EXAMINATION:  GENERAL:  77 y.o.-year-old patient lying in the bed with no acute distress.  EYES: Pupils equal, round, reactive to light and accommodation. No scleral icterus. Extraocular muscles intact.  HEENT: Head atraumatic, normocephalic. Oropharynx and nasopharynx clear.  NECK:  Supple, no jugular venous distention. No thyroid enlargement, no tenderness.  LUNGS: Normal breath sounds bilaterally, no wheezing, rales,rhonchi or  crepitation. No use of accessory muscles of respiration.  CARDIOVASCULAR: S1, S2 normal. No murmurs, rubs, or gallops.  ABDOMEN: Soft, non-tender, non-distended. Bowel sounds present. No organomegaly or mass.  EXTREMITIES: No pedal edema, cyanosis, or clubbing.  NEUROLOGIC: Cranial nerves II through XII are intact. Muscle strength 5/5 in all extremities. Sensation intact. Gait not checked.  PSYCHIATRIC: The patient is alert and oriented x 3.  SKIN: No obvious rash, lesion, or ulcer.   DATA REVIEW:   CBC Recent Labs  Lab 12/17/17 0519  WBC 11.4*  HGB 13.5  HCT 40.4  PLT 409    Chemistries  Recent Labs  Lab 12/17/17 0519  NA  132*  K 3.5  CL 77*  CO2 48*  GLUCOSE 101*  BUN 27*  CREATININE 0.78  CALCIUM 9.4  MG 2.0    Cardiac Enzymes No results for input(s): TROPONINI in the last 168 hours.  Microbiology Results  Results for orders placed or performed during the hospital encounter of 12/08/17  MRSA PCR Screening     Status: None   Collection Time: 12/08/17 11:51 AM  Result Value Ref Range Status   MRSA by PCR NEGATIVE NEGATIVE Final    Comment:        The GeneXpert MRSA Assay (FDA approved for NASAL specimens only), is one component of a comprehensive MRSA colonization surveillance program. It is not intended to diagnose MRSA infection nor to guide or monitor treatment for MRSA infections. Performed at [facility], [address] [place], [place] [address]   MRSA PCR Screening     Status: None   Collection Time: 12/13/17  3:36 PM  Result Value Ref Range Status   MRSA by PCR NEGATIVE NEGATIVE Final    Comment:        The GeneXpert MRSA Assay (FDA approved for NASAL specimens only), is one component of a comprehensive MRSA colonization surveillance program. It is not intended to diagnose MRSA infection nor to guide or monitor treatment for MRSA infections. Performed at [facility], [address] [place], [place] [address]     RADIOLOGY:  No results found.  EKG:   Orders placed or performed during the hospital encounter of 12/08/17  . ED EKG  . ED EKG  . EKG 12-Lead  . EKG 12-Lead      Management plans discussed with the patient, family and they are in agreement.  CODE STATUS:     Code Status Orders  (From admission, onward)        Start     Ordered   12/18/17 1107  Full code  Continuous     12/18/17 1106    Code Status History    Date Active Date Inactive Code Status Order ID Comments User Context   12/15/2017 1141 12/18/2017 1106 DNR [id number]  Milagros Loll, MD Inpatient   12/08/2017 1154 12/15/2017 1141 Full Code [id number]  Bertrum Sol, MD Inpatient   06/13/2017 1722 06/17/2017 2152 Full Code [id number]  Shaune Pollack, MD Inpatient      TOTAL TIME TAKING CARE OF THIS PATIENT: 45 minutes.    Evelena Asa Maziyah Vessel M.D on 12/19/2017 at 11:49 AM  Between 7am to 6pm - Pager - [phone]  After 6pm go to [email or web address] - password [email or web address]  [facility]  Office  [phone]  CC: Primary care physician; Dalia Heading, MD   Note: This dictation was prepared with Dragon dictation along with smaller phrase technology. Any transcriptional errors that result from this  process are unintentional.

## 2017-12-23 ENCOUNTER — Telehealth: Payer: Self-pay

## 2017-12-23 NOTE — Telephone Encounter (Signed)
EMMI Follow-up: Was noted on the report no discharge paperwork received.  Patient was actually transferred to [facility] so discharge papers were in the EMS packet and the number on file is her daughter's, Candace Butler.  I talked with Candace Butler since she receive the automated called and provided the follow-up appointment information and phone numbers in case appointment dates/time needed to be changed. She stated she will go with pt. to appointments and I asked her to relay that to nursing staff at the SNF. No other needs at this time.

## 2017-12-28 ENCOUNTER — Emergency Department: Payer: Medicare HMO

## 2017-12-28 ENCOUNTER — Encounter: Payer: Self-pay | Admitting: Emergency Medicine

## 2017-12-28 ENCOUNTER — Other Ambulatory Visit: Payer: Self-pay

## 2017-12-28 ENCOUNTER — Inpatient Hospital Stay
Admission: EM | Admit: 2017-12-28 | Discharge: 2018-01-10 | DRG: 640 | Disposition: A | Discharge status: Hospice | Payer: Medicare HMO | Attending: Internal Medicine | Admitting: Internal Medicine

## 2017-12-28 DIAGNOSIS — J449 Chronic obstructive pulmonary disease, unspecified: Secondary | ICD-10-CM | POA: Diagnosis not present

## 2017-12-28 DIAGNOSIS — E871 Hypo-osmolality and hyponatremia: Secondary | ICD-10-CM | POA: Diagnosis present

## 2017-12-28 DIAGNOSIS — F419 Anxiety disorder, unspecified: Secondary | ICD-10-CM | POA: Diagnosis present

## 2017-12-28 DIAGNOSIS — E861 Hypovolemia: Secondary | ICD-10-CM | POA: Diagnosis present

## 2017-12-28 DIAGNOSIS — J441 Chronic obstructive pulmonary disease with (acute) exacerbation: Secondary | ICD-10-CM | POA: Diagnosis not present

## 2017-12-28 DIAGNOSIS — E876 Hypokalemia: Secondary | ICD-10-CM | POA: Diagnosis present

## 2017-12-28 DIAGNOSIS — R0602 Shortness of breath: Secondary | ICD-10-CM | POA: Diagnosis not present

## 2017-12-28 DIAGNOSIS — D62 Acute posthemorrhagic anemia: Secondary | ICD-10-CM | POA: Diagnosis present

## 2017-12-28 DIAGNOSIS — E2740 Unspecified adrenocortical insufficiency: Secondary | ICD-10-CM | POA: Diagnosis present

## 2017-12-28 DIAGNOSIS — R4182 Altered mental status, unspecified: Secondary | ICD-10-CM | POA: Diagnosis not present

## 2017-12-28 DIAGNOSIS — F411 Generalized anxiety disorder: Secondary | ICD-10-CM | POA: Diagnosis not present

## 2017-12-28 DIAGNOSIS — J9811 Atelectasis: Secondary | ICD-10-CM

## 2017-12-28 DIAGNOSIS — J189 Pneumonia, unspecified organism: Secondary | ICD-10-CM

## 2017-12-28 DIAGNOSIS — Z9981 Dependence on supplemental oxygen: Secondary | ICD-10-CM

## 2017-12-28 DIAGNOSIS — H547 Unspecified visual loss: Secondary | ICD-10-CM | POA: Diagnosis present

## 2017-12-28 DIAGNOSIS — R339 Retention of urine, unspecified: Secondary | ICD-10-CM | POA: Diagnosis not present

## 2017-12-28 DIAGNOSIS — D638 Anemia in other chronic diseases classified elsewhere: Secondary | ICD-10-CM | POA: Diagnosis present

## 2017-12-28 DIAGNOSIS — Z7951 Long term (current) use of inhaled steroids: Secondary | ICD-10-CM | POA: Diagnosis not present

## 2017-12-28 DIAGNOSIS — G9341 Metabolic encephalopathy: Secondary | ICD-10-CM | POA: Diagnosis present

## 2017-12-28 DIAGNOSIS — J9622 Acute and chronic respiratory failure with hypercapnia: Secondary | ICD-10-CM | POA: Diagnosis present

## 2017-12-28 DIAGNOSIS — J9621 Acute and chronic respiratory failure with hypoxia: Secondary | ICD-10-CM | POA: Diagnosis present

## 2017-12-28 DIAGNOSIS — Z885 Allergy status to narcotic agent status: Secondary | ICD-10-CM

## 2017-12-28 DIAGNOSIS — R109 Unspecified abdominal pain: Secondary | ICD-10-CM

## 2017-12-28 DIAGNOSIS — S80212A Abrasion, left knee, initial encounter: Secondary | ICD-10-CM | POA: Diagnosis present

## 2017-12-28 DIAGNOSIS — K921 Melena: Secondary | ICD-10-CM | POA: Diagnosis present

## 2017-12-28 DIAGNOSIS — R52 Pain, unspecified: Secondary | ICD-10-CM | POA: Diagnosis not present

## 2017-12-28 DIAGNOSIS — R569 Unspecified convulsions: Secondary | ICD-10-CM | POA: Diagnosis present

## 2017-12-28 DIAGNOSIS — Z90710 Acquired absence of both cervix and uterus: Secondary | ICD-10-CM

## 2017-12-28 DIAGNOSIS — S81812A Laceration without foreign body, left lower leg, initial encounter: Secondary | ICD-10-CM | POA: Diagnosis present

## 2017-12-28 DIAGNOSIS — Z66 Do not resuscitate: Secondary | ICD-10-CM | POA: Diagnosis present

## 2017-12-28 DIAGNOSIS — Z7982 Long term (current) use of aspirin: Secondary | ICD-10-CM

## 2017-12-28 DIAGNOSIS — Z515 Encounter for palliative care: Secondary | ICD-10-CM | POA: Diagnosis present

## 2017-12-28 DIAGNOSIS — I10 Essential (primary) hypertension: Secondary | ICD-10-CM | POA: Diagnosis present

## 2017-12-28 DIAGNOSIS — W050XXA Fall from non-moving wheelchair, initial encounter: Secondary | ICD-10-CM | POA: Diagnosis present

## 2017-12-28 DIAGNOSIS — J440 Chronic obstructive pulmonary disease with acute lower respiratory infection: Secondary | ICD-10-CM | POA: Diagnosis present

## 2017-12-28 DIAGNOSIS — F17210 Nicotine dependence, cigarettes, uncomplicated: Secondary | ICD-10-CM | POA: Diagnosis present

## 2017-12-28 DIAGNOSIS — T502X5A Adverse effect of carbonic-anhydrase inhibitors, benzothiadiazides and other diuretics, initial encounter: Secondary | ICD-10-CM | POA: Diagnosis present

## 2017-12-28 DIAGNOSIS — Z888 Allergy status to other drugs, medicaments and biological substances status: Secondary | ICD-10-CM

## 2017-12-28 DIAGNOSIS — S8012XA Contusion of left lower leg, initial encounter: Secondary | ICD-10-CM | POA: Diagnosis not present

## 2017-12-28 DIAGNOSIS — Z7189 Other specified counseling: Secondary | ICD-10-CM | POA: Diagnosis not present

## 2017-12-28 LAB — CBC
HCT: 35.4 % (ref 35.0–47.0)
Hemoglobin: 13 g/dL (ref 12.0–16.0)
MCH: 31.8 pg (ref 26.0–34.0)
MCHC: 36.8 g/dL — ABNORMAL HIGH (ref 32.0–36.0)
MCV: 86.5 fL (ref 80.0–100.0)
Platelets: 332 10*3/uL (ref 150–440)
RBC: 4.1 MIL/uL (ref 3.80–5.20)
RDW: 13.6 % (ref 11.5–14.5)
WBC: 12.6 10*3/uL — ABNORMAL HIGH (ref 3.6–11.0)

## 2017-12-28 LAB — URINALYSIS, ROUTINE W REFLEX MICROSCOPIC
Bilirubin Urine: NEGATIVE
Glucose, UA: NEGATIVE mg/dL
Hgb urine dipstick: NEGATIVE
Ketones, ur: NEGATIVE mg/dL
Leukocytes, UA: NEGATIVE
Nitrite: NEGATIVE
Protein, ur: NEGATIVE mg/dL
Specific Gravity, Urine: 1.012 (ref 1.005–1.030)
pH: 6 (ref 5.0–8.0)

## 2017-12-28 LAB — COMPREHENSIVE METABOLIC PANEL
ALBUMIN: 4.1 g/dL (ref 3.5–5.0)
ALT: 47 U/L (ref 14–54)
AST: 50 U/L — ABNORMAL HIGH (ref 15–41)
Alkaline Phosphatase: 61 U/L (ref 38–126)
BILIRUBIN TOTAL: 1.3 mg/dL — AB (ref 0.3–1.2)
BUN: 20 mg/dL (ref 6–20)
CO2: 39 mmol/L — ABNORMAL HIGH (ref 22–32)
Calcium: 8.8 mg/dL — ABNORMAL LOW (ref 8.9–10.3)
Chloride: <65 mmol/L — CL (ref 101–111)
Creatinine, Ser: 0.5 mg/dL (ref 0.44–1.00)
GFR calc Af Amer: >60 mL/min (ref >60)
GFR calc non Af Amer: >60 mL/min (ref >60)
GLUCOSE: 108 mg/dL — AB (ref 65–99)
Potassium: 3.3 mmol/L — ABNORMAL LOW (ref 3.5–5.1)
SODIUM: 102 mmol/L — AB (ref 135–145)
TOTAL PROTEIN: 7.2 g/dL (ref 6.5–8.1)

## 2017-12-28 LAB — GLUCOSE, CAPILLARY: GLUCOSE-CAPILLARY: 105 mg/dL — AB (ref 65–99)

## 2017-12-28 LAB — BASIC METABOLIC PANEL
BUN: 20 mg/dL (ref 6–20)
CO2: 39 mmol/L — AB (ref 22–32)
Calcium: 8.8 mg/dL — ABNORMAL LOW (ref 8.9–10.3)
Chloride: <65 mmol/L — CL (ref 101–111)
Creatinine, Ser: 0.59 mg/dL (ref 0.44–1.00)
GFR calc non Af Amer: >60 mL/min (ref >60)
Glucose, Bld: 110 mg/dL — ABNORMAL HIGH (ref 65–99)
POTASSIUM: 3.2 mmol/L — AB (ref 3.5–5.1)
Sodium: 102 mmol/L — CL (ref 135–145)

## 2017-12-28 LAB — SODIUM: SODIUM: 103 mmol/L — AB (ref 135–145)

## 2017-12-28 LAB — LACTIC ACID, PLASMA: LACTIC ACID, VENOUS: 1.1 mmol/L (ref 0.5–1.9)

## 2017-12-28 LAB — TROPONIN I: Troponin I: <0.03 ng/mL (ref <0.03)

## 2017-12-28 LAB — MAGNESIUM: MAGNESIUM: 1.3 mg/dL — AB (ref 1.7–2.4)

## 2017-12-28 LAB — ETHANOL: Alcohol, Ethyl (B): <10 mg/dL (ref <10)

## 2017-12-28 MED ORDER — HEPARIN SODIUM (PORCINE) 5000 UNIT/ML IJ SOLN
5000.0000 [IU] | Freq: Three times a day (TID) | INTRAMUSCULAR | Status: DC
  Administered 2017-12-28 – 2018-01-02 (×6): 5000 [IU] via SUBCUTANEOUS
  Filled 2017-12-28 (×8): qty 1

## 2017-12-28 MED ORDER — ALBUTEROL SULFATE (2.5 MG/3ML) 0.083% IN NEBU
2.5000 mg | INHALATION_SOLUTION | Freq: Four times a day (QID) | RESPIRATORY_TRACT | Status: DC | PRN
  Administered 2018-01-02 – 2018-01-03 (×3): 2.5 mg via RESPIRATORY_TRACT
  Filled 2017-12-28 (×3): qty 3

## 2017-12-28 MED ORDER — SODIUM CHLORIDE 3 % IV SOLN
INTRAVENOUS | Status: DC
  Administered 2017-12-28 – 2017-12-29 (×2): 40 mL/h via INTRAVENOUS
  Filled 2017-12-28 (×3): qty 500

## 2017-12-28 MED ORDER — LORAZEPAM 2 MG/ML IJ SOLN: 1.0000 mg | INTRAMUSCULAR | Status: DC | PRN

## 2017-12-28 MED ORDER — TRAZODONE HCL 50 MG PO TABS: 25.0000 mg | ORAL_TABLET | Freq: Every evening | ORAL | Status: DC | PRN

## 2017-12-28 MED ORDER — PREDNISONE 10 MG PO TABS
40.0000 mg | ORAL_TABLET | Freq: Every day | ORAL | Status: DC
  Administered 2017-12-29 – 2017-12-31 (×3): 40 mg via ORAL
  Filled 2017-12-28 (×4): qty 4

## 2017-12-28 MED ORDER — ADULT MULTIVITAMIN W/MINERALS CH
1.0000 | ORAL_TABLET | Freq: Every day | ORAL | Status: DC
  Administered 2017-12-29 – 2018-01-09 (×9): 1 via ORAL
  Filled 2017-12-28 (×10): qty 1

## 2017-12-28 MED ORDER — LIDOCAINE HCL (PF) 1 % IJ SOLN
5.0000 mL | Freq: Once | INTRAMUSCULAR | Status: AC
  Administered 2017-12-28: 5 mL via INTRADERMAL

## 2017-12-28 MED ORDER — LIDOCAINE HCL (PF) 1 % IJ SOLN
INTRAMUSCULAR | Status: AC
  Administered 2017-12-28: 5 mL via INTRADERMAL
  Filled 2017-12-28: qty 5

## 2017-12-28 MED ORDER — ACETAMINOPHEN 325 MG PO TABS
650.0000 mg | ORAL_TABLET | Freq: Four times a day (QID) | ORAL | Status: DC | PRN
  Administered 2017-12-29 – 2018-01-10 (×10): 650 mg via ORAL
  Filled 2017-12-28 (×10): qty 2

## 2017-12-28 MED ORDER — ALPRAZOLAM 0.5 MG PO TABS
0.5000 mg | ORAL_TABLET | Freq: Two times a day (BID) | ORAL | Status: DC | PRN
  Administered 2017-12-29 – 2018-01-01 (×3): 0.5 mg via ORAL
  Filled 2017-12-28 (×3): qty 1

## 2017-12-28 MED ORDER — DOCUSATE SODIUM 100 MG PO CAPS
100.0000 mg | ORAL_CAPSULE | Freq: Two times a day (BID) | ORAL | Status: DC
Start: 2017-12-28 — End: 2018-01-10
  Administered 2017-12-29 – 2018-01-10 (×14): 100 mg via ORAL
  Filled 2017-12-28 (×19): qty 1

## 2017-12-28 MED ORDER — ASPIRIN EC 81 MG PO TBEC
81.0000 mg | DELAYED_RELEASE_TABLET | Freq: Every day | ORAL | Status: DC
  Administered 2017-12-29 – 2018-01-09 (×11): 81 mg via ORAL
  Filled 2017-12-28 (×11): qty 1

## 2017-12-28 MED ORDER — POTASSIUM CHLORIDE 10 MEQ/100ML IV SOLN
10.0000 meq | INTRAVENOUS | Status: AC
  Administered 2017-12-28 – 2017-12-29 (×4): 10 meq via INTRAVENOUS
  Filled 2017-12-28 (×4): qty 100

## 2017-12-28 MED ORDER — TRAZODONE HCL 50 MG PO TABS
50.0000 mg | ORAL_TABLET | Freq: Every evening | ORAL | Status: DC | PRN
  Administered 2017-12-29 – 2018-01-06 (×4): 50 mg via ORAL
  Filled 2017-12-28 (×4): qty 1

## 2017-12-28 MED ORDER — BISACODYL 5 MG PO TBEC
5.0000 mg | DELAYED_RELEASE_TABLET | Freq: Every day | ORAL | Status: DC | PRN
  Administered 2018-01-05: 5 mg via ORAL
  Filled 2017-12-28: qty 1

## 2017-12-28 MED ORDER — CARVEDILOL 3.125 MG PO TABS: 3.1250 mg | ORAL_TABLET | Freq: Two times a day (BID) | ORAL | Status: DC

## 2017-12-28 MED ORDER — HYDRALAZINE HCL 20 MG/ML IJ SOLN: 10.0000 mg | INTRAMUSCULAR | Status: DC | PRN

## 2017-12-28 MED ORDER — ONDANSETRON HCL 4 MG PO TABS: 4.0000 mg | ORAL_TABLET | Freq: Four times a day (QID) | ORAL | Status: DC | PRN

## 2017-12-28 MED ORDER — ALPRAZOLAM 0.5 MG PO TABS: 0.5000 mg | ORAL_TABLET | Freq: Three times a day (TID) | ORAL | Status: DC | PRN

## 2017-12-28 MED ORDER — ALPRAZOLAM 0.25 MG PO TABS: 0.1250 mg | ORAL_TABLET | Freq: Two times a day (BID) | ORAL | Status: DC | PRN

## 2017-12-28 MED ORDER — ONDANSETRON HCL 4 MG/2ML IJ SOLN
4.0000 mg | Freq: Four times a day (QID) | INTRAMUSCULAR | Status: DC | PRN
  Administered 2017-12-31: 4 mg via INTRAVENOUS
  Filled 2017-12-28: qty 2

## 2017-12-28 MED ORDER — TIOTROPIUM BROMIDE MONOHYDRATE 18 MCG IN CAPS
18.0000 ug | ORAL_CAPSULE | Freq: Every day | RESPIRATORY_TRACT | Status: DC
  Administered 2017-12-29 – 2018-01-02 (×5): 18 ug via RESPIRATORY_TRACT
  Filled 2017-12-28 (×2): qty 5

## 2017-12-28 MED ORDER — HYDROCODONE-ACETAMINOPHEN 5-325 MG PO TABS: 1.0000 | ORAL_TABLET | ORAL | Status: DC | PRN

## 2017-12-28 MED ORDER — FLUTICASONE FUROATE-VILANTEROL 200-25 MCG/INH IN AEPB
1.0000 | INHALATION_SPRAY | Freq: Every day | RESPIRATORY_TRACT | Status: DC
  Administered 2017-12-29 – 2018-01-02 (×5): 1 via RESPIRATORY_TRACT
  Filled 2017-12-28: qty 28

## 2017-12-28 MED ORDER — ACETAMINOPHEN 650 MG RE SUPP: 650.0000 mg | Freq: Four times a day (QID) | RECTAL | Status: DC | PRN

## 2017-12-28 NOTE — ED Triage Notes (Signed)
Pt via ems from [facility]. EMS reports that pt fell out of her wheelchair and that the wheelchair fell on top of her. She has laceration to her left shin and an abrasion to her left knee. Pt states she was "trying to get to her oxygen" because it ran out. During triage, pt alert & oriented x 4 but her eyes frequently roll back in her head and she appears to be grabbing at something in the sky. When questioned, she states she is reaching for a cold drin\k. Pt wears 4L O2 chronically.

## 2017-12-28 NOTE — ED Notes (Signed)
MD York Cerise made aware of critical sodium and chloride lab values.

## 2017-12-28 NOTE — Progress Notes (Signed)
MEDICATION RELATED CONSULT NOTE - INITIAL   Pharmacy Consult for  Potassium chloride  Indication:  hypokalemia  Allergies  Allergen Reactions  . Indomethacin   . Vicodin [Hydrocodone-Acetaminophen]     Patient Measurements: Height: 5' (152.4 cm) Weight: 165 lb (74.8 kg) IBW/kg (Calculated) : 45.5 Adjusted Body Weight:  Vital Signs: Temp: 98.4 F (36.9 C) (05/22 1856) Temp Source: Oral (05/22 1856) BP: 162/76 (05/22 2135) Pulse Rate: 90 (05/22 2135) Intake/Output from previous day: No intake/output data recorded. Intake/Output from this shift: No intake/output data recorded.  Labs: Recent Labs    12/28/17 1916 12/28/17 2009  WBC 12.6*  --   HGB 13.0  --   HCT 35.4  --   PLT 332  --   CREATININE 0.50 0.59  MG  --  1.3*  ALBUMIN 4.1  --   PROT 7.2  --   AST 50*  --   ALT 47  --   ALKPHOS 61  --   BILITOT 1.3*  --    Estimated Creatinine Clearance: 51.5 mL/min (by C-G formula based on SCr of 0.59 mg/dL).   Microbiology: Recent Results (from the past 720 hour(s))  MRSA PCR Screening     Status: None   Collection Time: 12/08/17 11:51 AM  Result Value Ref Range Status   MRSA by PCR NEGATIVE NEGATIVE Final    Comment:        The GeneXpert MRSA Assay (FDA approved for NASAL specimens only), is one component of a comprehensive MRSA colonization surveillance program. It is not intended to diagnose MRSA infection nor to guide or monitor treatment for MRSA infections. Performed at [facility], [address] [place], [place] [address]   MRSA PCR Screening     Status: None   Collection Time: 12/13/17  3:36 PM  Result Value Ref Range Status   MRSA by PCR NEGATIVE NEGATIVE Final    Comment:        The GeneXpert MRSA Assay (FDA approved for NASAL specimens only), is one component of a comprehensive MRSA colonization surveillance program. It is not intended to diagnose MRSA infection nor to guide or monitor treatment for MRSA  infections. Performed at [facility], [address], [place], [place] [address]     Medical History: Past Medical History:  Diagnosis Date  . Asthma   . COPD (chronic obstructive pulmonary disease) (HCC)   . Hypertension     Medications:    Assessment: 5/22:   K @ 20:00 = 3.2   Goal of Therapy:  K :  3.6 - 4.5   Plan:  Will order KCl 10 mEq IV X 4 and recheck electrolytes on 5/23 with am labs.   Sumit Branham D 12/28/2017,9:46 PM

## 2017-12-28 NOTE — Consult Note (Deleted)
Name: Candace Butler MRN: [id number] DOB: [birth year]    ADMISSION DATE:  12/28/2017 CONSULTATION DATE: 12/28/2017  REFERRING MD : Dr. Caryn Bee   CHIEF COMPLAINT: Fall   BRIEF PATIENT DESCRIPTION:  77 yo female from [facility] admitted following a fall found to have severe hypovolemic hyponatremia and hypochloridemia likely medication induced requiring hypertonic saline 3% gtt  SIGNIFICANT EVENTS/STUDIES:  05/22 Pt admitted to ICU  05/22 CT Head negative non contrasted CT appearance of the brain for age  HISTORY OF PRESENT ILLNESS:   This is a 77 yo female with a PMH of HTN, COPD, Chronic O2 , and Asthma.  She presented to [facility] ER on 05/22 via EMS from [facility] after falling out of her wheelchair, and the wheelchair fell on top of her head resulting in a left shin laceration and abrasion to her left knee. Per ER notes upon arrival to the ER the pt was alert and oriented x4, however her eyes were frequently rolling in the back of her head.  CT Head, blood alcohol level, and CXR negative.  Lab results revealed Na+ 102, K+ 3.3, chloride <65, CO2 39, lactic acid 1.1, and UA negative.  During previous hospitalization on 12/17/2017 her serum sodium was 132.  She was discharged from [facility] on 12/19/17 and  instructed to continue enalapril-hydrochlorothiazide and started on 60 mg furosemide daily.  During current hospitalization nephrology consulted plans to start hypertonic saline 3% gtt.  She was subsequently admitted to ICU for further workup and treatment.  PAST MEDICAL HISTORY :   has a past medical history of Asthma, COPD (chronic obstructive pulmonary disease) (HCC), and Hypertension.  has a past surgical history that includes Colon surgery and Abdominal hysterectomy. Prior to Admission medications   Medication Sig Start Date End Date Taking? Authorizing Provider  ADVAIR DISKUS 250-50 MCG/DOSE AEPB Take 1 puff 2 (two) times daily by mouth. 05/28/17   [provider]    albuterol (PROVENTIL HFA;VENTOLIN HFA) 108 (90 Base) MCG/ACT inhaler Inhale 2 puffs every 6 (six) hours as needed into the lungs for wheezing or shortness of breath. 06/16/17   Shaune Pollack, MD  ALPRAZolam Prudy Feeler) 0.5 MG tablet Take 0.25 tablets by mouth 2 (two) times daily as needed for anxiety.  05/23/17   [provider]  ALPRAZolam Prudy Feeler) 0.5 MG tablet Take 1 tablet (0.5 mg total) by mouth 3 (three) times daily as needed for anxiety. 12/19/17   Salary, Jetty Duhamel D, MD  aspirin EC 81 MG EC tablet Take 1 tablet (81 mg total) by mouth daily. 12/20/17   Salary, Evelena Asa, MD  carvedilol (COREG) 3.125 MG tablet Take 1 tablet (3.125 mg total) by mouth 2 (two) times daily with a meal. 12/19/17   Salary, Montell D, MD  enalapril-hydrochlorothiazide (VASERETIC) 10-25 MG tablet Take 1 tablet by mouth daily. 12/19/17   Salary, Evelena Asa, MD  feeding supplement, ENSURE ENLIVE, (ENSURE ENLIVE) LIQD Take 237 mLs by mouth 2 (two) times daily between meals. 12/19/17   Salary, Evelena Asa, MD  furosemide (LASIX) 20 MG tablet Take 3 tablets (60 mg total) by mouth daily. 12/20/17   Salary, Evelena Asa, MD  Multiple Vitamin (MULTIVITAMIN WITH MINERALS) TABS tablet Take 1 tablet by mouth daily. 12/20/17   Salary, Evelena Asa, MD  predniSONE (DELTASONE) 10 MG tablet Take 4 tablets (40 mg total) by mouth daily with breakfast. 12/20/17   Salary, Evelena Asa, MD  tiotropium (SPIRIVA) 18 MCG inhalation capsule Place 1 capsule (18 mcg total) daily into inhaler  and inhale. 06/18/17   Shaune Pollack, MD  traZODone (DESYREL) 50 MG tablet Take 1 tablet (50 mg total) by mouth at bedtime as needed for sleep. 12/19/17   Salary, Evelena Asa, MD   Allergies  Allergen Reactions  . Indomethacin   . Vicodin [Hydrocodone-Acetaminophen]     FAMILY HISTORY:  family history is not on file. SOCIAL HISTORY:  reports that she has been smoking cigarettes.  She has never used smokeless tobacco. She reports that she does not drink alcohol or use  drugs.  REVIEW OF SYSTEMS: Positives in BOLD  Constitutional: Negative for fever, chills, weight loss, malaise/fatigue and diaphoresis.  HENT: Negative for hearing loss, ear pain, nosebleeds, congestion, sore throat, neck pain, tinnitus and ear discharge.   Eyes: Negative for blurred vision, double vision, photophobia, pain, discharge and redness.  Respiratory: Negative for cough, hemoptysis, sputum production, shortness of breath, wheezing and stridor.   Cardiovascular: Negative for chest pain, palpitations, orthopnea, claudication, leg swelling and PND.  Gastrointestinal: Negative for heartburn, nausea, vomiting, abdominal pain, diarrhea, constipation, blood in stool and melena.  Genitourinary: Negative for dysuria, urgency, frequency, hematuria and flank pain.  Musculoskeletal: Negative for myalgias, back pain, joint pain and falls.  Skin: Negative for itching and rash.  Neurological: confusion, dizziness, tingling, tremors, sensory change, speech change, focal weakness, seizures, loss of consciousness, weakness and headaches.  Endo/Heme/Allergies: Negative for environmental allergies and polydipsia. Does not bruise/bleed easily.  SUBJECTIVE:  Pt states she is confused   VITAL SIGNS: Temp:  [98.4 F (36.9 C)] 98.4 F (36.9 C) (05/22 1856) Pulse Rate:  [25-90] 90 (05/22 2135) Resp:  [18-31] 18 (05/22 2135) BP: (158-168)/(76-88) 162/76 (05/22 2135) SpO2:  [90 %-99 %] 99 % (05/22 2135) Weight:  [74.8 kg (165 lb)] 74.8 kg (165 lb) (05/22 1857)  PHYSICAL EXAMINATION: General: acutely ill appearing female, NAD  Neuro: alert oriented to self only, follows commands, PERRL, eyes rolled backwards HEENT: supple, no JVD  Cardiovascular: sinus rhythm with PVC's, no R/G Lungs: diminished throughout, even, non labored  Abdomen: +BS x4, obese, soft, mild tenderness, non distended  Musculoskeletal: normal bulk and tone, no edema  Skin: intact no rashes or lesions  Recent Labs  Lab  12/28/17 1916 12/28/17 2009  NA 102* 102*  K 3.3* 3.2*  CL <65* <65*  CO2 39* 39*  BUN 20 20  CREATININE 0.50 0.59  GLUCOSE 108* 110*   Recent Labs  Lab 12/28/17 1916  HGB 13.0  HCT 35.4  WBC 12.6*  PLT 332   Dg Chest 2 View  Result Date: 12/28/2017 CLINICAL DATA:  Altered mental status and confusion. Fall out of wheelchair. EXAM: CHEST - 2 VIEW COMPARISON:  Radiograph 12/13/2017, additional priors FINDINGS: Decreased cardiomegaly from prior exam. Mediastinal contours are unchanged. There is atherosclerosis of the aortic arch. Improved pulmonary edema from prior, minimal central vascular congestion. Decreased pleural effusions from prior exam. No focal airspace disease or pneumothorax. Bones are under mineralized, no acute osseous abnormalities. IMPRESSION: No acute findings. Pulmonary edema and pleural effusions on prior exam have resolved. Electronically Signed   By: Rubye Oaks M.D.   On: 12/28/2017 21:14   Ct Head Wo Contrast  Result Date: 12/28/2017 CLINICAL DATA:  Fall with laceration EXAM: CT HEAD WITHOUT CONTRAST TECHNIQUE: Contiguous axial images were obtained from the base of the skull through the vertex without intravenous contrast. COMPARISON:  None. FINDINGS: Brain: No evidence of acute infarction, hemorrhage, hydrocephalus, extra-axial collection or mass lesion/mass effect. Mild atrophy Vascular: No hyperdense  vessel.  Carotid vascular calcification Skull: Normal. Negative for fracture or focal lesion. Sinuses/Orbits: No acute finding. Other: None IMPRESSION: Negative non contrasted CT appearance of the brain for age Electronically Signed   By: Jasmine Pang M.D.   On: 12/28/2017 20:56    ASSESSMENT / PLAN: Severe Hypovolemic Hyponatremia and Hypochloridemia likely medication induced  Fall Hypokalemia  Hypertension Hx: HTN, COPD, and Asthma P: Supplemental O2 for dyspnea and/or hypoxia Continue outpatient prednisone and bronchodilator therapy  Continuous  telemetry monitoring Nephrology consulted appreciate input-I discussed case with Dr. Thedore Mins who agrees with current plan  Hypertonic Saline 3% @ 40 ml/hr for now initial goal serum Na+ 110 Serum Na+ q2hrs x2 occurrences then q4hrs Seizure precautions Hold outpatient lasix and enalipril-hydrochlorothiazide  Prn iv ativan for seizure activity Frequent neuro checks   Continue outpatient carvedilol  Trend BMP  Replace electrolytes as indicated  Monitor UOP VTE px: subq heparin Trend CBC  Monitor for s/sx of bleeding and transfuse for hgb <7  Sonda Rumble, AGNP  Pulmonary/Critical Care Pager [phone] (please enter 7 digits) PCCM Consult Pager [phone] (please enter 7 digits)

## 2017-12-28 NOTE — Consult Note (Signed)
Name: Candace Butler MRN: [id number] DOB: [birth year]    ADMISSION DATE:  12/28/2017 CONSULTATION DATE: 12/28/2017  REFERRING MD : Dr. Caryn Bee   CHIEF COMPLAINT: Fall   BRIEF PATIENT DESCRIPTION:  77 yo female from [facility] admitted following a fall found to have severe hypovolemic hyponatremia and hypochloridemia likely medication induced requiring hypertonic saline 3% gtt  SIGNIFICANT EVENTS/STUDIES:  05/22 Pt admitted to ICU  05/22 CT Head negative non contrasted CT appearance of the brain for age  HISTORY OF PRESENT ILLNESS:   This is a 77 yo female with a PMH of HTN, COPD, Chronic O2 , and Asthma.  She presented to [facility] ER on 05/22 via EMS from [facility] after falling out of her wheelchair, and the wheelchair fell on top of her head resulting in a left shin laceration and abrasion to her left knee. Per ER notes upon arrival to the ER the pt was alert and oriented x4, however her eyes were frequently rolling in the back of her head.  CT Head, blood alcohol level, and CXR negative.  Lab results revealed Na+ 102, K+ 3.3, chloride <65, CO2 39, lactic acid 1.1, and UA negative.  During previous hospitalization on 12/17/2017 her serum sodium was 132.  She was discharged from [facility] on 12/19/17 and  instructed to continue enalapril-hydrochlorothiazide and started on 60 mg furosemide daily.  During current hospitalization nephrology consulted plans to start hypertonic saline 3% gtt.  She was subsequently admitted to ICU for further workup and treatment.  PAST MEDICAL HISTORY :   has a past medical history of Asthma, COPD (chronic obstructive pulmonary disease) (HCC), and Hypertension.  has a past surgical history that includes Colon surgery and Abdominal hysterectomy. Prior to Admission medications   Medication Sig Start Date End Date Taking? Authorizing Provider  ADVAIR DISKUS 250-50 MCG/DOSE AEPB Take 1 puff 2 (two) times daily by mouth. 05/28/17   [provider]    albuterol (PROVENTIL HFA;VENTOLIN HFA) 108 (90 Base) MCG/ACT inhaler Inhale 2 puffs every 6 (six) hours as needed into the lungs for wheezing or shortness of breath. 06/16/17   Shaune Pollack, MD  ALPRAZolam Prudy Feeler) 0.5 MG tablet Take 0.25 tablets by mouth 2 (two) times daily as needed for anxiety.  05/23/17   [provider]  ALPRAZolam Prudy Feeler) 0.5 MG tablet Take 1 tablet (0.5 mg total) by mouth 3 (three) times daily as needed for anxiety. 12/19/17   Salary, Jetty Duhamel D, MD  aspirin EC 81 MG EC tablet Take 1 tablet (81 mg total) by mouth daily. 12/20/17   Salary, Evelena Asa, MD  carvedilol (COREG) 3.125 MG tablet Take 1 tablet (3.125 mg total) by mouth 2 (two) times daily with a meal. 12/19/17   Salary, Montell D, MD  enalapril-hydrochlorothiazide (VASERETIC) 10-25 MG tablet Take 1 tablet by mouth daily. 12/19/17   Salary, Evelena Asa, MD  feeding supplement, ENSURE ENLIVE, (ENSURE ENLIVE) LIQD Take 237 mLs by mouth 2 (two) times daily between meals. 12/19/17   Salary, Evelena Asa, MD  furosemide (LASIX) 20 MG tablet Take 3 tablets (60 mg total) by mouth daily. 12/20/17   Salary, Evelena Asa, MD  Multiple Vitamin (MULTIVITAMIN WITH MINERALS) TABS tablet Take 1 tablet by mouth daily. 12/20/17   Salary, Evelena Asa, MD  predniSONE (DELTASONE) 10 MG tablet Take 4 tablets (40 mg total) by mouth daily with breakfast. 12/20/17   Salary, Evelena Asa, MD  tiotropium (SPIRIVA) 18 MCG inhalation capsule Place 1 capsule (18 mcg total) daily into inhaler  and inhale. 06/18/17   Shaune Pollack, MD  traZODone (DESYREL) 50 MG tablet Take 1 tablet (50 mg total) by mouth at bedtime as needed for sleep. 12/19/17   Salary, Evelena Asa, MD   Allergies  Allergen Reactions  . Indomethacin   . Vicodin [Hydrocodone-Acetaminophen]     FAMILY HISTORY:  family history is not on file. SOCIAL HISTORY:  reports that she has been smoking cigarettes.  She has never used smokeless tobacco. She reports that she does not drink alcohol or use  drugs.  REVIEW OF SYSTEMS: Positives in BOLD  Constitutional: Negative for fever, chills, weight loss, malaise/fatigue and diaphoresis.  HENT: Negative for hearing loss, ear pain, nosebleeds, congestion, sore throat, neck pain, tinnitus and ear discharge.   Eyes: Negative for blurred vision, double vision, photophobia, pain, discharge and redness.  Respiratory: Negative for cough, hemoptysis, sputum production, shortness of breath, wheezing and stridor.   Cardiovascular: Negative for chest pain, palpitations, orthopnea, claudication, leg swelling and PND.  Gastrointestinal: Negative for heartburn, nausea, vomiting, abdominal pain, diarrhea, constipation, blood in stool and melena.  Genitourinary: Negative for dysuria, urgency, frequency, hematuria and flank pain.  Musculoskeletal: Negative for myalgias, back pain, joint pain and falls.  Skin: Negative for itching and rash.  Neurological: confusion, dizziness, tingling, tremors, sensory change, speech change, focal weakness, seizures, loss of consciousness, weakness and headaches.  Endo/Heme/Allergies: Negative for environmental allergies and polydipsia. Does not bruise/bleed easily.  SUBJECTIVE:  Pt states she is confused   VITAL SIGNS: Temp:  [98.4 F (36.9 C)-98.5 F (36.9 C)] 98.5 F (36.9 C) (05/22 2224) Pulse Rate:  [25-90] 87 (05/22 2224) Resp:  [18-31] 19 (05/22 2224) BP: (149-168)/(76-101) 149/101 (05/22 2224) SpO2:  [90 %-99 %] 97 % (05/22 2224) Weight:  [74.8 kg (164 lb 14.5 oz)-74.8 kg (165 lb)] 74.8 kg (164 lb 14.5 oz) (05/22 2223)  PHYSICAL EXAMINATION: General: acutely ill appearing female, NAD  Neuro: alert oriented to self only, follows commands, PERRL, eyes rolled backwards HEENT: supple, no JVD  Cardiovascular: sinus rhythm with PVC's, no R/G Lungs: diminished throughout, even, non labored  Abdomen: +BS x4, obese, soft, mild tenderness, non distended  Musculoskeletal: normal bulk and tone, no edema  Skin:  laceration to left shin and abrasion stitches in place   Recent Labs  Lab 12/28/17 1916 12/28/17 2009  NA 102* 102*  K 3.3* 3.2*  CL <65* <65*  CO2 39* 39*  BUN 20 20  CREATININE 0.50 0.59  GLUCOSE 108* 110*   Recent Labs  Lab 12/28/17 1916  HGB 13.0  HCT 35.4  WBC 12.6*  PLT 332   Dg Chest 2 View  Result Date: 12/28/2017 CLINICAL DATA:  Altered mental status and confusion. Fall out of wheelchair. EXAM: CHEST - 2 VIEW COMPARISON:  Radiograph 12/13/2017, additional priors FINDINGS: Decreased cardiomegaly from prior exam. Mediastinal contours are unchanged. There is atherosclerosis of the aortic arch. Improved pulmonary edema from prior, minimal central vascular congestion. Decreased pleural effusions from prior exam. No focal airspace disease or pneumothorax. Bones are under mineralized, no acute osseous abnormalities. IMPRESSION: No acute findings. Pulmonary edema and pleural effusions on prior exam have resolved. Electronically Signed   By: Rubye Oaks M.D.   On: 12/28/2017 21:14   Ct Head Wo Contrast  Result Date: 12/28/2017 CLINICAL DATA:  Fall with laceration EXAM: CT HEAD WITHOUT CONTRAST TECHNIQUE: Contiguous axial images were obtained from the base of the skull through the vertex without intravenous contrast. COMPARISON:  None. FINDINGS: Brain: No evidence of  acute infarction, hemorrhage, hydrocephalus, extra-axial collection or mass lesion/mass effect. Mild atrophy Vascular: No hyperdense vessel.  Carotid vascular calcification Skull: Normal. Negative for fracture or focal lesion. Sinuses/Orbits: No acute finding. Other: None IMPRESSION: Negative non contrasted CT appearance of the brain for age Electronically Signed   By: Jasmine Pang M.D.   On: 12/28/2017 20:56    ASSESSMENT / PLAN: Severe Hypovolemic Hyponatremia and Hypochloridemia likely medication induced  Fall Hypokalemia  Hypertension Hx: HTN, COPD, and Asthma P: Supplemental O2 for dyspnea and/or  hypoxia Continue outpatient prednisone and bronchodilator therapy  Continuous telemetry monitoring Nephrology consulted appreciate input-I discussed case with Dr. Thedore Mins who agrees with current plan  Hypertonic Saline 3% @ 40 ml/hr for now initial goal serum Na+ 110 Serum Na+ q2hrs x2 occurrences then q4hrs Seizure precautions Hold outpatient lasix and enalipril-hydrochlorothiazide  Prn iv ativan for seizure activity Frequent neuro checks   Continue outpatient carvedilol  Trend BMP  Replace electrolytes as indicated  Monitor UOP VTE px: subq heparin Trend CBC  Monitor for s/sx of bleeding and transfuse for hgb <7  Sonda Rumble, AGNP  Pulmonary/Critical Care Pager [phone] (please enter 7 digits) PCCM Consult Pager [phone] (please enter 7 digits)

## 2017-12-28 NOTE — ED Provider Notes (Signed)
[facility] Emergency Department Provider Note  ____________________________________________   First MD Initiated Contact with Patient 12/28/17 2007     (approximate)  I have reviewed the triage vital signs and the nursing notes.   HISTORY  Chief Complaint Fall  Level 5 caveat:  history/ROS limited by acute/critical illness  HPI Candace Butler is a 77 y.o. female with medical history as listed below who [facility] for evaluation after a fall with some altered mental status.  Reportedly the patient fell out of her chair for unspecified reasons.  She did not lose consciousness but she did strike her left lower leg on something sharp and has 2 different lacerations, one right below her knee and one lower down on her anterior lower leg.  She denies any pain but she is quite confused, alert to herself but not to location or date/time.  This is apparently not her baseline.  She complains of some generalized weakness and not feeling "right".  She frequently reaches out and grabs at objects that are not actually in front of her and when she is asked what she is doing, she states that she is reaching for a drink.  She denies chest pain, shortness of breath, nausea, vomiting, and abdominal pain.  She denies any dysuria.  She has some mild pain in her left lower leg at the site of the injuries.  She states that she has no headache and no neck pain and no back pain.  She does not believe she had her head and there is no report of that happening.  Her confusion is severe and the onset of the other symptoms was acute, nothing in particular makes it better or worse.  Past Medical History:  Diagnosis Date  . Asthma   . COPD (chronic obstructive pulmonary disease) (HCC)   . Hypertension     Patient Active Problem List   Diagnosis Date Noted  . Acute hyponatremia 12/28/2017  . Respiratory failure (HCC) 12/08/2017  . COPD exacerbation (HCC) 06/13/2017     Past Surgical History:  Procedure Laterality Date  . ABDOMINAL HYSTERECTOMY    . COLON SURGERY      Prior to Admission medications   Medication Sig Start Date End Date Taking? Authorizing Provider  ADVAIR DISKUS 250-50 MCG/DOSE AEPB Take 1 puff 2 (two) times daily by mouth. 05/28/17  Yes [provider]  albuterol (PROVENTIL HFA;VENTOLIN HFA) 108 (90 Base) MCG/ACT inhaler Inhale 2 puffs every 6 (six) hours as needed into the lungs for wheezing or shortness of breath. 06/16/17  Yes Shaune Pollack, MD  ALPRAZolam Prudy Feeler) 0.25 MG tablet Take 0.25 mg by mouth 3 (three) times daily as needed for anxiety.   Yes [provider]  aspirin EC 81 MG EC tablet Take 1 tablet (81 mg total) by mouth daily. 12/20/17  Yes Salary, Evelena Asa, MD  carvedilol (COREG) 3.125 MG tablet Take 1 tablet (3.125 mg total) by mouth 2 (two) times daily with a meal. 12/19/17  Yes Salary, Montell D, MD  enalapril-hydrochlorothiazide (VASERETIC) 10-25 MG tablet Take 1 tablet by mouth daily. 12/19/17  Yes Salary, Evelena Asa, MD  furosemide (LASIX) 20 MG tablet Take 3 tablets (60 mg total) by mouth daily. 12/20/17  Yes Salary, Evelena Asa, MD  Multiple Vitamin (MULTIVITAMIN WITH MINERALS) TABS tablet Take 1 tablet by mouth daily. 12/20/17  Yes Salary, Evelena Asa, MD  predniSONE (DELTASONE) 10 MG tablet Take 4 tablets (40 mg total) by mouth daily with  breakfast. 12/20/17  Yes Salary, Evelena Asa, MD  tiotropium (SPIRIVA) 18 MCG inhalation capsule Place 1 capsule (18 mcg total) daily into inhaler and inhale. 06/18/17  Yes Shaune Pollack, MD  traZODone (DESYREL) 50 MG tablet Take 1 tablet (50 mg total) by mouth at bedtime as needed for sleep. 12/19/17  Yes Salary, Evelena Asa, MD  ALPRAZolam (XANAX) 0.5 MG tablet Take 1 tablet (0.5 mg total) by mouth 3 (three) times daily as needed for anxiety. 12/19/17   Salary, Evelena Asa, MD  feeding supplement, ENSURE ENLIVE, (ENSURE ENLIVE) LIQD Take 237 mLs by mouth 2 (two) times daily between  meals. 12/19/17   Salary, Evelena Asa, MD    Allergies Indomethacin and Vicodin [hydrocodone-acetaminophen]  No family history on file.  Social History Social History   Tobacco Use  . Smoking status: Current Every Day Smoker    Types: Cigarettes  . Smokeless tobacco: Never Used  Substance Use Topics  . Alcohol use: No    Frequency: Never  . Drug use: No    Review of Systems Level 5 caveat:  history/ROS limited by acute/critical illness, see HPI for additional details ____________________________________________   PHYSICAL EXAM:  VITAL SIGNS: ED Triage Vitals  Enc Vitals Group     BP 12/28/17 1856 (!) 158/80     Pulse Rate 12/28/17 1856 90     Resp 12/28/17 1856 20     Temp 12/28/17 1856 98.4 F (36.9 C)     Temp Source 12/28/17 1856 Oral     SpO2 12/28/17 1856 90 %     Weight 12/28/17 1857 74.8 kg (165 lb)     Height 12/28/17 1857 1.524 m (5')     Head Circumference --      [facility] --      Pain Score 12/28/17 1856 0     Pain Loc --      Pain Edu? --      Excl. in GC? --     Constitutional: Alert but disoriented to location and date/time.  Appears chronically ill but in no acute distress Eyes: Conjunctivae are normal.  Head: Atraumatic. Nose: No congestion/rhinnorhea. Mouth/Throat: Mucous membranes are moist. Neck: No stridor.  No meningeal signs.  No cervical spine tenderness to palpation. Cardiovascular: Normal rate, regular rhythm. Good peripheral circulation. Grossly normal heart sounds. Respiratory: Normal respiratory effort.  No retractions. Lungs CTAB. Gastrointestinal: Soft and nontender. No distention.  Musculoskeletal: No lower extremity tenderness nor edema. No gross deformities of extremities. Neurologic:  Normal speech and language. No gross focal neurologic deficits are appreciated.  Skin:  Skin is warm, dry and intact except for a 4 cm x 2 cm wedge-shaped laceration to her left lower extremity and a 4 cm straight superficial laceration just  below her knee.  There is no significant tenderness to palpation and no suggestion of osseous abnormality.  The bleeding is well controlled.   ____________________________________________   LABS (all labs ordered are listed, but only abnormal results are displayed)  Labs Reviewed  COMPREHENSIVE METABOLIC PANEL - Abnormal; Notable for the following components:      Result Value   Sodium 102 (*)    Potassium 3.3 (*)    Chloride <65 (*)    CO2 39 (*)    Glucose, Bld 108 (*)    Calcium 8.8 (*)    AST 50 (*)    Total Bilirubin 1.3 (*)    All other components within normal limits  CBC - Abnormal; Notable for the following components:  WBC 12.6 (*)    MCHC 36.8 (*)    All other components within normal limits  BASIC METABOLIC PANEL - Abnormal; Notable for the following components:   Sodium 102 (*)    Potassium 3.2 (*)    Chloride <65 (*)    CO2 39 (*)    Glucose, Bld 110 (*)    Calcium 8.8 (*)    All other components within normal limits  URINALYSIS, ROUTINE W REFLEX MICROSCOPIC - Abnormal; Notable for the following components:   Color, Urine YELLOW (*)    APPearance CLEAR (*)    All other components within normal limits  MAGNESIUM - Abnormal; Notable for the following components:   Magnesium 1.3 (*)    All other components within normal limits  SODIUM - Abnormal; Notable for the following components:   Sodium 103 (*)    All other components within normal limits  MRSA PCR SCREENING  ETHANOL  TROPONIN I  LACTIC ACID, PLASMA  LACTIC ACID, PLASMA  SODIUM  OSMOLALITY  OSMOLALITY, URINE  SODIUM, URINE, RANDOM  BASIC METABOLIC PANEL  CBC  CBG MONITORING, ED   ____________________________________________  EKG  ED ECG REPORT I, Loleta Rose, the attending physician, personally viewed and interpreted this ECG.  Date: 12/28/2017 EKG Time: 19: 01 Rate: 87 Rhythm: normal sinus rhythm QRS Axis: normal Intervals: Borderline intraventricular conduction delay ST/T Wave  abnormalities: Non-specific ST segment / T-wave changes, but no evidence of acute ischemia. Narrative Interpretation: no evidence of acute ischemia   ____________________________________________  RADIOLOGY I, Loleta Rose, personally viewed and evaluated these images as part of my medical decision making, as well as reviewing the written report by the radiologist.  ED MD interpretation: No acute abnormality on head CT  Official radiology report(s): Dg Chest 2 View  Result Date: 12/28/2017 CLINICAL DATA:  Altered mental status and confusion. Fall out of wheelchair. EXAM: CHEST - 2 VIEW COMPARISON:  Radiograph 12/13/2017, additional priors FINDINGS: Decreased cardiomegaly from prior exam. Mediastinal contours are unchanged. There is atherosclerosis of the aortic arch. Improved pulmonary edema from prior, minimal central vascular congestion. Decreased pleural effusions from prior exam. No focal airspace disease or pneumothorax. Bones are under mineralized, no acute osseous abnormalities. IMPRESSION: No acute findings. Pulmonary edema and pleural effusions on prior exam have resolved. Electronically Signed   By: Rubye Oaks M.D.   On: 12/28/2017 21:14   Ct Head Wo Contrast  Result Date: 12/28/2017 CLINICAL DATA:  Fall with laceration EXAM: CT HEAD WITHOUT CONTRAST TECHNIQUE: Contiguous axial images were obtained from the base of the skull through the vertex without intravenous contrast. COMPARISON:  None. FINDINGS: Brain: No evidence of acute infarction, hemorrhage, hydrocephalus, extra-axial collection or mass lesion/mass effect. Mild atrophy Vascular: No hyperdense vessel.  Carotid vascular calcification Skull: Normal. Negative for fracture or focal lesion. Sinuses/Orbits: No acute finding. Other: None IMPRESSION: Negative non contrasted CT appearance of the brain for age Electronically Signed   By: Jasmine Pang M.D.   On: 12/28/2017 20:56     ____________________________________________   PROCEDURES  Critical Care performed: Yes, see critical care procedure note(s)   Procedure(s) performed:   .Critical Care Performed by: Loleta Rose, MD Authorized by: Loleta Rose, MD   Critical care provider statement:    Critical care time (minutes):  30   Critical care time was exclusive of:  Separately billable procedures and treating other patients   Critical care was necessary to treat or prevent imminent or life-threatening deterioration of the following conditions:  CNS  failure or compromise and metabolic crisis   Critical care was time spent personally by me on the following activities:  Development of treatment plan with patient or surrogate, discussions with consultants, evaluation of patient's response to treatment, examination of patient, obtaining history from patient or surrogate, ordering and performing treatments and interventions, ordering and review of laboratory studies, ordering and review of radiographic studies, pulse oximetry, re-evaluation of patient's condition and review of old charts .Marland KitchenLaceration Repair Date/Time: 12/29/2017 1:32 AM Performed by: Loleta Rose, MD Authorized by: Loleta Rose, MD   Consent:    Consent obtained:  Emergent situation   Consent given by:  Patient Anesthesia (see MAR for exact dosages):    Anesthesia method:  None Laceration details:    Location:  Leg   Leg location:  L lower leg   Length (cm):  4   Depth (mm):  1 Repair type:    Repair type:  Simple Exploration:    Contaminated: no   Treatment:    Amount of cleaning:  Standard   Irrigation solution:  Sterile saline   Visualized foreign bodies/material removed: no   Skin repair:    Repair method:  Steri-Strips   Number of Steri-Strips:  5 Approximation:    Approximation:  Close Post-procedure details:    Dressing:  Open (no dressing)   Patient tolerance of procedure:  Tolerated well, no immediate  complications .Marland KitchenLaceration Repair Date/Time: 12/29/2017 1:33 AM Performed by: Loleta Rose, MD Authorized by: Loleta Rose, MD   Consent:    Consent obtained:  Emergent situation Anesthesia (see MAR for exact dosages):    Anesthesia method:  Local infiltration   Local anesthetic:  Lidocaine 1% w/o epi Laceration details:    Location:  Leg   Leg location:  L lower leg   Length (cm):  4.5 (4.5x2 cm V-shaped laceration) Repair type:    Repair type:  Simple Exploration:    Hemostasis achieved with:  Direct pressure   Wound exploration: entire depth of wound probed and visualized     Contaminated: no   Treatment:    Area cleansed with:  Saline   Amount of cleaning:  Extensive   Irrigation solution:  Sterile saline   Visualized foreign bodies/material removed: no   Skin repair:    Repair method:  Sutures   Suture size:  4-0   Suture material:  Prolene   Suture technique:  Simple interrupted   Number of sutures:  4 Approximation:    Approximation:  Close Post-procedure details:    Dressing:  Sterile dressing   Patient tolerance of procedure:  Tolerated well, no immediate complications     ____________________________________________   INITIAL IMPRESSION / ASSESSMENT AND PLAN / ED COURSE  As part of my medical decision making, I reviewed the following data within the electronic MEDICAL RECORD NUMBER Nursing notes reviewed and incorporated, Labs reviewed , EKG interpreted , Old chart reviewed, Radiograph reviewed , Discussed with admitting physician , A consult was requested and obtained from this/these consultant(s)  and Notes from prior ED visits    Differential diagnosis includes, but is not limited to, acute intracranial head injury such as a subdural hematoma, infectious process, electrolyte/metabolic abnormality, CVA/TIA.  Patient's head CT was unremarkable and chest x-ray was unremarkable.  Vital signs are stable and within normal limits but the patient is very altered and  this apparently is far from her baseline.  Initial lab work was very concerning and demonstrated a sodium of 102.  I looked back through her  prior records shows that her sodium was 138 just 10 days ago.  I am concerned that this may represent a contaminant or an otherwise compromised specimen for the lab.  We are sending a new blood draw to repeat the metabolic panel.  Obviously if this is an accurate sodium she is at great risk of seizures and we will monitor her carefully.  However the risk to her if I were to start aggressive treatment with hypertonic saline is such that I need to await confirmation.  She is on multiple diuretics and looking back through her history she does not have a history of SIADH.  She is stable at this time.  I will also get started repairing her leg lacerations so she will be ready for the ICU if that is required.  Lab work otherwise generally reassuring except for the severe metabolic normality.  Clinical Course as of Dec 30 135  Wed Dec 28, 2017  2053 Repeat basic metabolic panel confirms a sodium of 102 and a chloride of less than 65.  This appears to be legitimate.  I have paged the ICU doctor to discuss, but given the fact that she is acutely altered I think it is important that I treat her with hypertonic saline given the strong probability of seizures in the setting.  Her last sodium was 138 about 10 days ago.  Paging intensivist to discuss.   [CF]  2106 I spoke by phone with the intensive care physician through ER link and we discussed the case in detail.  She agreed that this is acute hyponatremia in the setting of multiple medications that are likely driving her sodium low.  She did recommend that we go ahead and start hypertonic normal saline.  There is a protocol for the use of the hypertonic normal saline and we are starting it now through a large bore PIV as per the protocol, and we will get her to the ICU ASAP for central line placement and close neurological  monitoring.  Will repair leg laceration prior to sending to the ICU   [CF]    Clinical Course User Index [CF] Loleta Rose, MD    ____________________________________________  FINAL CLINICAL IMPRESSION(S) / ED DIAGNOSES  Final diagnoses:  Acute hyponatremia  Altered mental status, unspecified altered mental status type  Contusion of multiple sites of left lower extremity, initial encounter  Leg laceration, left, initial encounter     MEDICATIONS GIVEN DURING THIS VISIT:  Medications  sodium chloride (hypertonic) 3 % solution (ordered in ED)  lidocaine (PF) (XYLOCAINE) 1 % injection 5 mL (5 mLs Intradermal Given 12/28/17 2134)     ED Discharge Orders    None       Note:  This document was prepared using Dragon voice recognition software and may include unintentional dictation errors.    Loleta Rose, MD 12/29/17 [id number]

## 2017-12-28 NOTE — H&P (Addendum)
[facility] - [place] at [facility]   PATIENT NAME: Candace Butler    MR#:  [id number]  DATE OF BIRTH:  1940-12-11  DATE OF ADMISSION:  12/28/2017  PRIMARY CARE PHYSICIAN: Dalia Heading, MD   REQUESTING/REFERRING PHYSICIAN:   CHIEF COMPLAINT:   Chief Complaint  Patient presents with  . Fall    HISTORY OF PRESENT ILLNESS: Candace Butler  is a 77 y.o. female with a known history of COPD and hypertension.  Patient is unable to provide history due to confusion.  Most of the information was taken from reviewing the medical chart and from discussion with emergency room physician. Patient was transferred from [facility] facility due to acute confusion and hallucinations, started acutely in the past 24 hours and gradually getting worse.  Also, per report from the facility, patient fell off her chair, due to severe generalized weakness, just before she was transferred to emergency room.  She did not lose her consciousness but she did strike her left lower leg on something sharp causing 2 different lacerations. Blood test done emergency room are remarkable for low sodium level at 102, low chloride level, below 65 and low potassium level at 3.2.  A recent sodium level from 10 days ago was 138.  Chest x-ray and brain CT, reviewed by myself, are noted without any acute changes. Patient is admitted for further evaluation and treatment.   PAST MEDICAL HISTORY:   Past Medical History:  Diagnosis Date  . Asthma   . COPD (chronic obstructive pulmonary disease) (HCC)   . Hypertension     PAST SURGICAL HISTORY:  Past Surgical History:  Procedure Laterality Date  . ABDOMINAL HYSTERECTOMY    . COLON SURGERY      SOCIAL HISTORY:  Social History   Tobacco Use  . Smoking status: Current Every Day Smoker    Types: Cigarettes  . Smokeless tobacco: Never Used  Substance Use Topics  . Alcohol use: No    Frequency: Never    FAMILY HISTORY: No family history on  file.  DRUG ALLERGIES:  Allergies  Allergen Reactions  . Indomethacin   . Vicodin [Hydrocodone-Acetaminophen]     REVIEW OF SYSTEMS:   Unable to obtain due to patient's confusion.   MEDICATIONS AT HOME:  Prior to Admission medications   Medication Sig Start Date End Date Taking? Authorizing Provider  ADVAIR DISKUS 250-50 MCG/DOSE AEPB Take 1 puff 2 (two) times daily by mouth. 05/28/17  Yes [provider]  albuterol (PROVENTIL HFA;VENTOLIN HFA) 108 (90 Base) MCG/ACT inhaler Inhale 2 puffs every 6 (six) hours as needed into the lungs for wheezing or shortness of breath. 06/16/17  Yes Shaune Pollack, MD  ALPRAZolam Prudy Feeler) 0.25 MG tablet Take 0.25 mg by mouth 3 (three) times daily as needed for anxiety.   Yes [provider]  aspirin EC 81 MG EC tablet Take 1 tablet (81 mg total) by mouth daily. 12/20/17  Yes Salary, Evelena Asa, MD  carvedilol (COREG) 3.125 MG tablet Take 1 tablet (3.125 mg total) by mouth 2 (two) times daily with a meal. 12/19/17  Yes Salary, Montell D, MD  enalapril-hydrochlorothiazide (VASERETIC) 10-25 MG tablet Take 1 tablet by mouth daily. 12/19/17  Yes Salary, Evelena Asa, MD  furosemide (LASIX) 20 MG tablet Take 3 tablets (60 mg total) by mouth daily. 12/20/17  Yes Salary, Evelena Asa, MD  Multiple Vitamin (MULTIVITAMIN WITH MINERALS) TABS tablet Take 1 tablet by mouth daily. 12/20/17  Yes Salary, Evelena Asa, MD  predniSONE (DELTASONE) 10 MG tablet Take 4 tablets (40 mg total) by mouth daily with breakfast. 12/20/17  Yes Salary, Montell D, MD  tiotropium (SPIRIVA) 18 MCG inhalation capsule Place 1 capsule (18 mcg total) daily into inhaler and inhale. 06/18/17  Yes Shaune Pollack, MD  traZODone (DESYREL) 50 MG tablet Take 1 tablet (50 mg total) by mouth at bedtime as needed for sleep. 12/19/17  Yes Salary, Evelena Asa, MD  ALPRAZolam (XANAX) 0.5 MG tablet Take 1 tablet (0.5 mg total) by mouth 3 (three) times daily as needed for anxiety. 12/19/17   Salary, Evelena Asa, MD   feeding supplement, ENSURE ENLIVE, (ENSURE ENLIVE) LIQD Take 237 mLs by mouth 2 (two) times daily between meals. 12/19/17   Salary, Evelena Asa, MD      PHYSICAL EXAMINATION:   VITAL SIGNS: Blood pressure (!) 120/48, pulse 65, temperature 98.5 F (36.9 C), temperature source Oral, resp. rate 13, height  (1.575 m), weight 74.8 kg (164 lb 14.5 oz), SpO2 98 %.  GENERAL:  77 y.o.-year-old patient lying in the bed, confused and lethargic.  Dry mucous membranes are noted. EYES: Pupils equal, round, reactive to light and accommodation. No scleral icterus.  HEENT: Head atraumatic, normocephalic. Oropharynx and nasopharynx clear.  NECK:  Supple, no jugular venous distention. No thyroid enlargement, no tenderness.  LUNGS: Reduced breath sounds bilaterally, no wheezing, rales,rhonchi or crepitation. No use of accessory muscles of respiration.  CARDIOVASCULAR: S1, S2 normal. No S3/S4.  ABDOMEN: Soft, nontender, nondistended. Bowel sounds present. No organomegaly or mass.  EXTREMITIES: No pedal edema, cyanosis, or clubbing.  NEUROLOGIC EXAM: Is limited, as patient does not follow commands.  She is moving all her extremities.  No focal weakness appreciated PSYCHIATRIC: The patient is alert, but lethargic and completely disoriented.  SKIN: There is a 4 x 2 cm laceration to the left lower extremity and a 4 cm straight superficial laceration just below the left knee.  No bleeding or drainage at this time.  LABORATORY PANEL:   CBC Recent Labs  Lab 12/28/17 1916  WBC 12.6*  HGB 13.0  HCT 35.4  PLT 332  MCV 86.5  MCH 31.8  MCHC 36.8*  RDW 13.6   ------------------------------------------------------------------------------------------------------------------  Chemistries  Recent Labs  Lab 12/28/17 1916 12/28/17 2009  NA 102* 102*  K 3.3* 3.2*  CL <65* <65*  CO2 39* 39*  GLUCOSE 108* 110*  BUN 20 20  CREATININE 0.50 0.59  CALCIUM 8.8* 8.8*  MG  --  1.3*  AST 50*  --   ALT 47  --    ALKPHOS 61  --   BILITOT 1.3*  --    ------------------------------------------------------------------------------------------------------------------ estimated creatinine clearance is 54 mL/min (by C-G formula based on SCr of 0.59 mg/dL). ------------------------------------------------------------------------------------------------------------------ No results for input(s): TSH, T4TOTAL, T3FREE, THYROIDAB in the last 72 hours.  Invalid input(s): FREET3   Coagulation profile No results for input(s): INR, PROTIME in the last 168 hours. ------------------------------------------------------------------------------------------------------------------- No results for input(s): DDIMER in the last 72 hours. -------------------------------------------------------------------------------------------------------------------  Cardiac Enzymes Recent Labs  Lab 12/28/17 2023  TROPONINI <0.03   ------------------------------------------------------------------------------------------------------------------ Invalid input(s): POCBNP  ---------------------------------------------------------------------------------------------------------------  Urinalysis    Component Value Date/Time   COLORURINE YELLOW (A) 12/28/2017 2010   APPEARANCEUR CLEAR (A) 12/28/2017 2010   LABSPEC 1.012 12/28/2017 2010   PHURINE 6.0 12/28/2017 2010   GLUCOSEU NEGATIVE 12/28/2017 2010   HGBUR NEGATIVE 12/28/2017 2010   BILIRUBINUR NEGATIVE 12/28/2017 2010   KETONESUR NEGATIVE 12/28/2017 2010   PROTEINUR NEGATIVE 12/28/2017 2010  NITRITE NEGATIVE 12/28/2017 2010   LEUKOCYTESUR NEGATIVE 12/28/2017 2010     RADIOLOGY: Dg Chest 2 View  Result Date: 12/28/2017 CLINICAL DATA:  Altered mental status and confusion. Fall out of wheelchair. EXAM: CHEST - 2 VIEW COMPARISON:  Radiograph 12/13/2017, additional priors FINDINGS: Decreased cardiomegaly from prior exam. Mediastinal contours are unchanged. There is  atherosclerosis of the aortic arch. Improved pulmonary edema from prior, minimal central vascular congestion. Decreased pleural effusions from prior exam. No focal airspace disease or pneumothorax. Bones are under mineralized, no acute osseous abnormalities. IMPRESSION: No acute findings. Pulmonary edema and pleural effusions on prior exam have resolved. Electronically Signed   By: Rubye Oaks M.D.   On: 12/28/2017 21:14   Ct Head Wo Contrast  Result Date: 12/28/2017 CLINICAL DATA:  Fall with laceration EXAM: CT HEAD WITHOUT CONTRAST TECHNIQUE: Contiguous axial images were obtained from the base of the skull through the vertex without intravenous contrast. COMPARISON:  None. FINDINGS: Brain: No evidence of acute infarction, hemorrhage, hydrocephalus, extra-axial collection or mass lesion/mass effect. Mild atrophy Vascular: No hyperdense vessel.  Carotid vascular calcification Skull: Normal. Negative for fracture or focal lesion. Sinuses/Orbits: No acute finding. Other: None IMPRESSION: Negative non contrasted CT appearance of the brain for age Electronically Signed   By: Jasmine Pang M.D.   On: 12/28/2017 20:56    EKG: Orders placed or performed during the hospital encounter of 12/28/17  . EKG 12-Lead  . EKG 12-Lead  . EKG 12-Lead  . EKG 12-Lead    IMPRESSION AND PLAN:  1.  Acute severe hyponatremia, will start treatment with hypertonic saline.  Patient looks clinically volume depleted.  Will admit patient to intensive care unit for close observation and management.  We will check sodium level every 2 hours.  We will hold hydrochlorothiazide and Lasix. 2.  Acute metabolic encephalopathy, likely related to hyponatremia.  We will continue to monitor clinically closely, while treating the underlying disorder. 3.  Left lower extremity laceration, status post repair in the emergency room. 4.  COPD, stable.  Continue maintenance medications. 5.  Hypokalemia, will replace K per protocol.  All  the records are reviewed and case discussed with ED provider. Management plans discussed with the patient, family and they are in agreement.  CODE STATUS: FULL    Code Status Orders  (From admission, onward)        Start     Ordered   12/28/17 2224  Full code  Continuous     12/28/17 2223    Code Status History    Date Active Date Inactive Code Status Order ID Comments User Context   12/18/2017 1106 12/19/2017 2102 Full Code [id number]  Bertrum Sol, MD Inpatient   12/15/2017 1141 12/18/2017 1106 DNR [id number]  Milagros Loll, MD Inpatient   12/08/2017 1154 12/15/2017 1141 Full Code [id number]  Bertrum Sol, MD Inpatient   06/13/2017 1722 06/17/2017 2152 Full Code [id number]  Shaune Pollack, MD Inpatient       TOTAL TIME TAKING CARE OF THIS PATIENT: 45 minutes.    Cammy Copa M.D on 12/28/2017 at 11:32 PM  Between 7am to 6pm - Pager - [phone]  After 6pm go to [email or web address] - password [email or web address] [facility]  [facility]  Office  [phone]  CC: Primary care physician; Dalia Heading, MD

## 2017-12-28 NOTE — ED Notes (Signed)
Attempted to give report; was told to call back in 15 min

## 2017-12-29 ENCOUNTER — Inpatient Hospital Stay: Payer: Medicare HMO

## 2017-12-29 ENCOUNTER — Other Ambulatory Visit: Payer: Self-pay

## 2017-12-29 LAB — CBC
HCT: 33.6 % — ABNORMAL LOW (ref 35.0–47.0)
Hemoglobin: 12 g/dL (ref 12.0–16.0)
MCH: 31.1 pg (ref 26.0–34.0)
MCHC: 35.8 g/dL (ref 32.0–36.0)
MCV: 86.9 fL (ref 80.0–100.0)
Platelets: 312 10*3/uL (ref 150–440)
RBC: 3.87 MIL/uL (ref 3.80–5.20)
RDW: 13.7 % (ref 11.5–14.5)
WBC: 12.3 10*3/uL — ABNORMAL HIGH (ref 3.6–11.0)

## 2017-12-29 LAB — PHOSPHORUS: Phosphorus: 2 mg/dL — ABNORMAL LOW (ref 2.5–4.6)

## 2017-12-29 LAB — SODIUM, URINE, RANDOM: Sodium, Ur: 12 mmol/L

## 2017-12-29 LAB — BASIC METABOLIC PANEL
BUN: 15 mg/dL (ref 6–20)
CO2: 35 mmol/L — AB (ref 22–32)
Calcium: 8.3 mg/dL — ABNORMAL LOW (ref 8.9–10.3)
Chloride: <65 mmol/L — CL (ref 101–111)
Creatinine, Ser: 0.39 mg/dL — ABNORMAL LOW (ref 0.44–1.00)
GFR calc Af Amer: >60 mL/min (ref >60)
GLUCOSE: 86 mg/dL (ref 65–99)
POTASSIUM: 3.4 mmol/L — AB (ref 3.5–5.1)
Sodium: 108 mmol/L — CL (ref 135–145)

## 2017-12-29 LAB — MAGNESIUM: Magnesium: 1.1 mg/dL — ABNORMAL LOW (ref 1.7–2.4)

## 2017-12-29 LAB — SODIUM
SODIUM: 114 mmol/L — AB (ref 135–145)
SODIUM: 118 mmol/L — AB (ref 135–145)
Sodium: 106 mmol/L — CL (ref 135–145)
Sodium: 119 mmol/L — CL (ref 135–145)

## 2017-12-29 LAB — LACTIC ACID, PLASMA: Lactic Acid, Venous: 1 mmol/L (ref 0.5–1.9)

## 2017-12-29 LAB — GLUCOSE, CAPILLARY: Glucose-Capillary: 73 mg/dL (ref 65–99)

## 2017-12-29 LAB — MRSA PCR SCREENING: MRSA by PCR: NEGATIVE

## 2017-12-29 LAB — OSMOLALITY: Osmolality: 210 mOsm/kg — CL (ref 275–295)

## 2017-12-29 LAB — OSMOLALITY, URINE: OSMOLALITY UR: 241 mosm/kg — AB (ref 300–900)

## 2017-12-29 MED ORDER — ENSURE ENLIVE PO LIQD
237.0000 mL | Freq: Two times a day (BID) | ORAL | Status: DC
  Administered 2017-12-29 – 2018-01-09 (×19): 237 mL via ORAL

## 2017-12-29 MED ORDER — POTASSIUM PHOSPHATES 15 MMOLE/5ML IV SOLN
20.0000 mmol | Freq: Once | INTRAVENOUS | Status: AC
  Administered 2017-12-29: 20 mmol via INTRAVENOUS
  Filled 2017-12-29: qty 6.67

## 2017-12-29 MED ORDER — MAGNESIUM SULFATE 4 GM/100ML IV SOLN
4.0000 g | Freq: Once | INTRAVENOUS | Status: AC
  Administered 2017-12-29: 4 g via INTRAVENOUS
  Filled 2017-12-29: qty 100

## 2017-12-29 NOTE — Progress Notes (Signed)
RN notified Annabelle Harman, NP of serum sodium of 108.  NP acknowledged.

## 2017-12-29 NOTE — Progress Notes (Signed)
RN notified Candace Harman, NP of serum sodium of 106.  NP acknowledged.

## 2017-12-29 NOTE — Evaluation (Signed)
Clinical/Bedside Swallow Evaluation Patient Details  Name: Candace Butler MRN: [id number] Date of Birth: 28-Apr-1941  Today's Date: 12/29/2017 Time: SLP Start Time (ACUTE ONLY): 0900 SLP Stop Time (ACUTE ONLY): 1000 SLP Time Calculation (min) (ACUTE ONLY): 60 min  Past Medical History:  Past Medical History:  Diagnosis Date  . Asthma   . COPD (chronic obstructive pulmonary disease) (HCC)   . Hypertension    Past Surgical History:  Past Surgical History:  Procedure Laterality Date  . ABDOMINAL HYSTERECTOMY    . COLON SURGERY     HPI:  Pt is a 77 year old female referred for evaluation of essential hypertension.  Patient has known history of essential hypertension and COPD.  The patient is a resident at [facility], fell out of her wheelchair, brought to [facility] emergency room to evaluate laceration on her lower leg, noted to be markedly hyponatremic with a sodium of 102, with associated altered mental status.  Admission labs notable for normal renal function with BUN and creatinine of 20 and 0.59, respectively.  Troponin was less than 0.03.  She was placed on hypertonic saline, repeat sodium this morning was 108.  Blood pressure is 98/68. Dtr present described pt as being alert and engaged in ADLs prior to last weekend when she started becoming more fatigued and lost her appetite eventually, confusion.    Assessment / Plan / Recommendation Clinical Impression  Pt appears to present w/ adequate oropharyngeal phase swallow function w/ trials of thin liquids and Purees given at this assessment today; trials of solids were not assessed d/t pt's drowsiness and request to rest(low Sodium only 108 from 102 currently). Recommend a Dysphagia level 3(mech soft w/ Minced Meats moistened) for easier mastication modified from the currently ordered regular diet - ST services will monitor for toleration w/ NSG at first meals. Pt appears at reduced risk for aspiration when following general aspiration  precautions during oral intake. Pt consumed trials of thin liquids and purees w/ no overt s/s of aspiration; no decline in O2 sats or vocal quality during/post po's. Pt's oral phase appeared [facility] for bolus management and oral clearing; no OM weakness or decreased tone appreciated. Pt would benefit w/ monitoring at meals to support w/ feeding and tray setup/positioning. Recommend Pills in Puree - IF needed for safer, easier swallowing.  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 General aspiration precautions; Reflux precautions d/t increased Belching w/ liquids at eval  Medication Administration: Whole meds with puree(IF needed for easier, safer swallowing)    Other  Recommendations Recommended Consults: (Dietician f/u) Oral Care Recommendations: Oral care BID;Staff/trained caregiver to provide oral care;Patient independent with oral care Other Recommendations: (n/a)   Follow up Recommendations None      Frequency and Duration min 2x/week  1 week       Prognosis Prognosis for Safe Diet Advancement: Good Barriers to Reach Goals: (Low Sodium; drowsy)      Swallow Study   General Date of Onset: 12/28/17 HPI: Pt is a 77 year old female referred for evaluation of essential hypertension.  Patient has known history of essential hypertension and COPD.  The patient is a resident at [facility], fell out of her wheelchair, brought to [facility] emergency room to evaluate laceration on her lower leg, noted to be markedly hyponatremic with a sodium of 102, with  associated altered mental status.  Admission labs notable for normal renal function with BUN and creatinine of 20 and 0.59, respectively.  Troponin was less than 0.03.  She was placed on hypertonic saline, repeat sodium this  morning was 108.  Blood pressure is 98/68. Dtr present described pt as being alert and engaged in ADLs prior to last weekend when she started becoming more fatigued and lost her appetite eventually, confusion.  Type of Study: Bedside Swallow Evaluation Previous Swallow Assessment: none Diet Prior to this Study: Regular;Thin liquids Temperature Spikes Noted: No(wbc declining 12.1) Respiratory Status: Nasal cannula(4 liters) History of Recent Intubation: No Behavior/Cognition: Cooperative;Pleasant mood;Lethargic/Drowsy;Requires cueing(min) Oral Cavity Assessment: Within Functional Limits;Dry(min) Oral Care Completed by SLP: Recent completion by staff Oral Cavity - Dentition: Adequate natural dentition Vision: (n/a) Self-Feeding Abilities: Needs assist;Needs set up;Total assist(currently) Patient Positioning: Upright in bed Baseline Vocal Quality: Low vocal intensity Volitional Cough: Strong Volitional Swallow: Able to elicit    Oral/Motor/Sensory Function Overall Oral Motor/Sensory Function: Within functional limits(w/ general commands; bolus management)   Ice Chips Ice chips: Within functional limits Presentation: Spoon(fed; 3 trials)   Thin Liquid Thin Liquid: Within functional limits Presentation: Straw(assisted; 10 trials of various liquids)    Nectar Thick Nectar Thick Liquid: Not tested   Honey Thick Honey Thick Liquid: Not tested   Puree Puree: Within functional limits Presentation: Spoon(fed; 6 trials)   Solid   GO   Solid: Not tested Other Comments: pt stated she wanted to "sleep now" - suspect adequate oral management for minced/soft foods cut down and moistened when not overly fatigued(will modify from the regular diet ordered)       Jerilynn Som, MS, CCC-SLP Xzaviar Maloof 12/29/2017,10:18 AM

## 2017-12-29 NOTE — Progress Notes (Signed)
CRITICAL VALUE ALERT  Critical Value:  210 serum osmolalaity  Date & Time Notied:  12/29/2017 1100  Provider Notified: Belia Heman & Thedore Mins  Orders Received/Actions taken: no new orders

## 2017-12-29 NOTE — Progress Notes (Signed)
Melburn Popper, RN notified Annabelle Harman, NP of critical sodium level of 103.  NP acknowledged.

## 2017-12-29 NOTE — Consult Note (Signed)
Date: 12/29/2017                  Patient Name:  Candace Butler  MRN: [id number]  DOB: May 13, 1941  Age / Sex: 77 y.o., female         PCP: Dalia Heading, MD                 Service Requesting Consult: IM/ Altamese Dilling, *                 Reason for Consult: Hyponatremia            History of Present Illness: Patient is a 77 y.o. female with medical problems of HTN, Asthma, COPD, who was admitted to [facility] on 12/28/2017 for evaluation of hallucination and confusion.  Patient's daughter reports that for the past 3-4 days patient has been going downhill She slumped over at [facility] after her PMD visit.  She has generalized weakness.  She was brought to the emergency room for evaluation of lacerations as a result of the loss of consciousness and fall from the chair.  She was found to have severe hyponatremia with sodium of 102.  She will started on 3% normal saline. Nephrology consult is requested for further evaluation   Medications: Outpatient medications: Medications Prior to Admission  Medication Sig Dispense Refill Last Dose  . ADVAIR DISKUS 250-50 MCG/DOSE AEPB Take 1 puff 2 (two) times daily by mouth.   12/28/2017 at 0800  . albuterol (PROVENTIL HFA;VENTOLIN HFA) 108 (90 Base) MCG/ACT inhaler Inhale 2 puffs every 6 (six) hours as needed into the lungs for wheezing or shortness of breath. 1 Inhaler 2 prn at prn  . ALPRAZolam (XANAX) 0.25 MG tablet Take 0.25 mg by mouth 3 (three) times daily as needed for anxiety.   12/26/2017 at 0225  . aspirin EC 81 MG EC tablet Take 1 tablet (81 mg total) by mouth daily. 90 tablet 0 12/28/2017 at 0800  . carvedilol (COREG) 3.125 MG tablet Take 1 tablet (3.125 mg total) by mouth 2 (two) times daily with a meal. 60 tablet 0 12/28/2017 at 0800  . enalapril-hydrochlorothiazide (VASERETIC) 10-25 MG tablet Take 1 tablet by mouth daily. 30 tablet 0 12/28/2017 at 0800  . furosemide (LASIX) 20 MG tablet Take 3 tablets (60 mg total) by  mouth daily. 30 tablet 0 12/28/2017 at 0800  . Multiple Vitamin (MULTIVITAMIN WITH MINERALS) TABS tablet Take 1 tablet by mouth daily. 90 tablet 0 12/28/2017 at 0800  . predniSONE (DELTASONE) 10 MG tablet Take 4 tablets (40 mg total) by mouth daily with breakfast. 10 tablet 0 12/28/2017 at 0800  . tiotropium (SPIRIVA) 18 MCG inhalation capsule Place 1 capsule (18 mcg total) daily into inhaler and inhale. 30 capsule 1 12/28/2017 at 0800  . traZODone (DESYREL) 50 MG tablet Take 1 tablet (50 mg total) by mouth at bedtime as needed for sleep. 30 tablet 0 prn at prn  . ALPRAZolam (XANAX) 0.5 MG tablet Take 1 tablet (0.5 mg total) by mouth 3 (three) times daily as needed for anxiety. 10 tablet 0 12/24/2017 at 1445  . feeding supplement, ENSURE ENLIVE, (ENSURE ENLIVE) LIQD Take 237 mLs by mouth 2 (two) times daily between meals. 90 Bottle 12     Current medications: Current Facility-Administered Medications  Medication Dose Route Frequency Provider Last Rate Last Dose  . acetaminophen (TYLENOL) tablet 650 mg  650 mg Oral Q6H PRN Cammy Copa, MD       Or  .  acetaminophen (TYLENOL) suppository 650 mg  650 mg Rectal Q6H PRN Cammy Copa, MD      . albuterol (PROVENTIL) (2.5 MG/3ML) 0.083% nebulizer solution 2.5 mg  2.5 mg Inhalation Q6H PRN Cammy Copa, MD      . ALPRAZolam Prudy Feeler) tablet 0.5 mg  0.5 mg Oral BID PRN Eugenie Norrie, NP      . aspirin EC tablet 81 mg  81 mg Oral Daily Cammy Copa, MD   81 mg at 12/29/17 0752  . bisacodyl (DULCOLAX) EC tablet 5 mg  5 mg Oral Daily PRN Cammy Copa, MD      . docusate sodium (COLACE) capsule 100 mg  100 mg Oral BID Cammy Copa, MD   100 mg at 12/29/17 0752  . fluticasone furoate-vilanterol (BREO ELLIPTA) 200-25 MCG/INH 1 puff  1 puff Inhalation Daily Cammy Copa, MD   1 puff at 12/29/17 0749  . heparin injection 5,000 Units  5,000 Units Subcutaneous Q8H Cammy Copa, MD   5,000 Units at 12/29/17 0549  . hydrALAZINE (APRESOLINE) injection 10-20 mg   10-20 mg Intravenous Q4H PRN Eugenie Norrie, NP      . HYDROcodone-acetaminophen (NORCO/VICODIN) 5-325 MG per tablet 1-2 tablet  1-2 tablet Oral Q4H PRN Cammy Copa, MD      . LORazepam (ATIVAN) injection 1-2 mg  1-2 mg Intravenous Q4H PRN Eugenie Norrie, NP      . multivitamin with minerals tablet 1 tablet  1 tablet Oral Daily Cammy Copa, MD   1 tablet at 12/29/17 0747  . ondansetron (ZOFRAN) tablet 4 mg  4 mg Oral Q6H PRN Cammy Copa, MD       Or  . ondansetron [facility]) injection 4 mg  4 mg Intravenous Q6H PRN Cammy Copa, MD      . predniSONE (DELTASONE) tablet 40 mg  40 mg Oral Q breakfast Cammy Copa, MD   40 mg at 12/29/17 0747  . sodium chloride (hypertonic) 3 % solution   Intravenous Continuous Eugenie Norrie, NP 40 mL/hr at 12/29/17 0800    . tiotropium (SPIRIVA) inhalation capsule 18 mcg  18 mcg Inhalation Daily Cammy Copa, MD   18 mcg at 12/29/17 0749  . traZODone (DESYREL) tablet 50 mg  50 mg Oral QHS PRN Cammy Copa, MD          Allergies: Allergies  Allergen Reactions  . Indomethacin   . Vicodin [Hydrocodone-Acetaminophen]       Past Medical History: Past Medical History:  Diagnosis Date  . Asthma   . COPD (chronic obstructive pulmonary disease) (HCC)   . Hypertension      Past Surgical History: Past Surgical History:  Procedure Laterality Date  . ABDOMINAL HYSTERECTOMY    . COLON SURGERY       Family History: No family history on file.   Social History: Social History   Socioeconomic History  . Marital status: Divorced    Spouse name: Not on file  . Number of children: Not on file  . Years of education: Not on file  . Highest education level: Not on file  Occupational History  . Not on file  Social Needs  . Financial resource strain: Not on file  . Food insecurity:    Worry: Not on file    Inability: Not on file  . Transportation needs:    Medical: Not on file    Non-medical: Not on file  Tobacco Use  . Smoking  status: Current Every Day Smoker  Types: Cigarettes  . Smokeless tobacco: Never Used  Substance and Sexual Activity  . Alcohol use: No    Frequency: Never  . Drug use: No  . Sexual activity: Not on file  Lifestyle  . Physical activity:    Days per week: Not on file    Minutes per session: Not on file  . Stress: Not on file  Relationships  . Social connections:    Talks on phone: Not on file    Gets together: Not on file    Attends religious service: Not on file    Active member of club or organization: Not on file    Attends meetings of clubs or organizations: Not on file    Relationship status: Not on file  . Intimate partner violence:    Fear of current or ex partner: Not on file    Emotionally abused: Not on file    Physically abused: Not on file    Forced sexual activity: Not on file  Other Topics Concern  . Not on file  Social History Narrative  . Not on file     Review of Systems:not available due to patient being very lethargic Gen:  HEENT:  CV:  Resp:  GI: GU :  MS:  Derm:   Psych: Heme:  Neuro:  Endocrine  Vital Signs: Blood pressure (!) 114/99, pulse 90, temperature 98.1 F (36.7 C), resp. rate 16, height  (1.575 m), weight 73.4 kg (161 lb 13.1 oz), SpO2 99 %.   Intake/Output Summary (Last 24 hours) at 12/29/2017 0858 Last data filed at 12/29/2017 0800 Gross per 24 hour  Intake 811.67 ml  Output 300 ml  Net 511.67 ml    Weight trends: Filed Weights   12/28/17 1857 12/28/17 2223 12/29/17 0512  Weight: 74.8 kg (165 lb) 74.8 kg (164 lb 14.5 oz) 73.4 kg (161 lb 13.1 oz)    Physical Exam: General:  no acute distress, laying in the bed  HEENT Eyes close, moist oral mucous membranes  Neck:  no JVD  Lungs: Lungs are clear to auscultation  Heart::  no rub or gallop  Abdomen: Soft, nontender, nondistended  Extremities:  no edema  Neurologic: Very lethargic.  Did not answer any questions  Skin: No acute rashes, normal turgor              Lab results: Basic Metabolic Panel: Recent Labs  Lab 12/28/17 1916 12/28/17 2009 12/28/17 2258 12/29/17 0203 12/29/17 0609  NA 102* 102* 103* 106* 108*  K 3.3* 3.2*  --   --  3.4*  CL <65* <65*  --   --  <65*  CO2 39* 39*  --   --  35*  GLUCOSE 108* 110*  --   --  86  BUN 20 20  --   --  15  CREATININE 0.50 0.59  --   --  0.39*  CALCIUM 8.8* 8.8*  --   --  8.3*  MG  --  1.3*  --   --   --     Liver Function Tests: Recent Labs  Lab 12/28/17 1916  AST 50*  ALT 47  ALKPHOS 61  BILITOT 1.3*  PROT 7.2  ALBUMIN 4.1   No results for input(s): LIPASE, AMYLASE in the last 168 hours. No results for input(s): AMMONIA in the last 168 hours.  CBC: Recent Labs  Lab 12/28/17 1916 12/29/17 0609  WBC 12.6* 12.3*  HGB 13.0 12.0  HCT 35.4 33.6*  MCV 86.5  86.9  PLT 332 312    Cardiac Enzymes: Recent Labs  Lab 12/28/17 2023  TROPONINI <0.03    BNP: Invalid input(s): POCBNP  CBG: Recent Labs  Lab 12/28/17 2217 12/29/17 0714  GLUCAP 105* 73    Microbiology: Recent Results (from the past 720 hour(s))  MRSA PCR Screening     Status: None   Collection Time: 12/08/17 11:51 AM  Result Value Ref Range Status   MRSA by PCR NEGATIVE NEGATIVE Final    Comment:        The GeneXpert MRSA Assay (FDA approved for NASAL specimens only), is one component of a comprehensive MRSA colonization surveillance program. It is not intended to diagnose MRSA infection nor to guide or monitor treatment for MRSA infections. Performed at [facility], [address] [place], [place] [address]   MRSA PCR Screening     Status: None   Collection Time: 12/13/17  3:36 PM  Result Value Ref Range Status   MRSA by PCR NEGATIVE NEGATIVE Final    Comment:        The GeneXpert MRSA Assay (FDA approved for NASAL specimens only), is one component of a comprehensive MRSA colonization surveillance program. It is not intended to diagnose MRSA infection nor to guide  or monitor treatment for MRSA infections. Performed at [facility], [address] [place], [place] [address]   MRSA PCR Screening     Status: None   Collection Time: 12/28/17 10:27 PM  Result Value Ref Range Status   MRSA by PCR NEGATIVE NEGATIVE Final    Comment:        The GeneXpert MRSA Assay (FDA approved for NASAL specimens only), is one component of a comprehensive MRSA colonization surveillance program. It is not intended to diagnose MRSA infection nor to guide or monitor treatment for MRSA infections. Performed at [facility], [address] [place], [place] [address]      Coagulation Studies: No results for input(s): LABPROT, INR in the last 72 hours.  Urinalysis: Recent Labs    12/28/17 2010  COLORURINE YELLOW*  LABSPEC 1.012  PHURINE 6.0  GLUCOSEU NEGATIVE  HGBUR NEGATIVE  BILIRUBINUR NEGATIVE  KETONESUR NEGATIVE  PROTEINUR NEGATIVE  NITRITE NEGATIVE  LEUKOCYTESUR NEGATIVE        Imaging: Dg Chest 2 View  Result Date: 12/28/2017 CLINICAL DATA:  Altered mental status and confusion. Fall out of wheelchair. EXAM: CHEST - 2 VIEW COMPARISON:  Radiograph 12/13/2017, additional priors FINDINGS: Decreased cardiomegaly from prior exam. Mediastinal contours are unchanged. There is atherosclerosis of the aortic arch. Improved pulmonary edema from prior, minimal central vascular congestion. Decreased pleural effusions from prior exam. No focal airspace disease or pneumothorax. Bones are under mineralized, no acute osseous abnormalities. IMPRESSION: No acute findings. Pulmonary edema and pleural effusions on prior exam have resolved. Electronically Signed   By: Rubye Oaks M.D.   On: 12/28/2017 21:14   Ct Head Wo Contrast  Result Date: 12/28/2017 CLINICAL DATA:  Fall with laceration EXAM: CT HEAD WITHOUT CONTRAST TECHNIQUE: Contiguous axial images were obtained from the base of the skull through the vertex without intravenous contrast.  COMPARISON:  None. FINDINGS: Brain: No evidence of acute infarction, hemorrhage, hydrocephalus, extra-axial collection or mass lesion/mass effect. Mild atrophy Vascular: No hyperdense vessel.  Carotid vascular calcification Skull: Normal. Negative for fracture or focal lesion. Sinuses/Orbits: No acute finding. Other: None IMPRESSION: Negative non contrasted CT appearance of the brain for age Electronically Signed   By: Adrian Prows.D.  On: 12/28/2017 20:56      Assessment & Plan: Pt is a 77 y.o. caucasian  female with Asthma, COPD, HTN was admitted on 12/28/2017 with Acute hyponatremia.    1.  Acute hyponatremia Patient had normal sodium level of 136 on May 10  Hyponatremia seems to be secondary to overdiuresis in the setting of poor p.o. Intake Patient also appears to be mildly symptomatic in terms of decreased responsiveness and possibility of seizures as witnessed by her daughter stating that she may have rolled her eyes  Patient was treated with 3% saline for initial fast correction and then slow correction  Sodium level was 108 this morning Goal of correction by tomorrow morning is 120 After sodium reached 118, 3% saline was discontinued Patient will be monitored closely.  She has started to eat some.  2.  Hypomagnesemia Expected to correct with normal diet          LOS: 1 Ghassan Coggeshall Thedore Mins 5/23/20198:58 AM  [facility] [place], [place] [phone]  Note: This note was prepared with Dragon dictation. Any transcription errors are unintentional

## 2017-12-29 NOTE — Progress Notes (Signed)
[employer] [facility] [place] [facility]   PATIENT NAME: Candace Butler    MR#:  [id number]  DATE OF BIRTH:  04/12/1941  SUBJECTIVE:  CHIEF COMPLAINT:   Chief Complaint  Patient presents with  . Fall   Came with weakness, was here 2 weeks ago, Was discharged on oral Lasix and hydrochlorothiazide, came with generalized weakness and noted to have severe hyponatremia.  REVIEW OF SYSTEMS:  CONSTITUTIONAL: No fever, severe fatigue or weakness.  EYES: No blurred or double vision.  EARS, NOSE, AND THROAT: No tinnitus or ear pain.  RESPIRATORY: No cough, shortness of breath, wheezing or hemoptysis.  CARDIOVASCULAR: No chest pain, orthopnea, edema.  GASTROINTESTINAL: No nausea, vomiting, diarrhea or abdominal pain.  GENITOURINARY: No dysuria, hematuria.  ENDOCRINE: No polyuria, nocturia,  HEMATOLOGY: No anemia, easy bruising or bleeding SKIN: No rash or lesion. MUSCULOSKELETAL: No joint pain or arthritis.   NEUROLOGIC: No tingling, numbness, weakness.  PSYCHIATRY: No anxiety or depression.   ROS  DRUG ALLERGIES:   Allergies  Allergen Reactions  . Indomethacin   . Vicodin [Hydrocodone-Acetaminophen]     VITALS:  Blood pressure 115/60, pulse 67, temperature (!) 97.5 F (36.4 C), temperature source Oral, resp. rate 17, height  (1.575 m), weight 73.4 kg (161 lb 13.1 oz), SpO2 100 %.  PHYSICAL EXAMINATION:  GENERAL:  77 y.o.-year-old patient lying in the bed with no acute distress.  EYES: Pupils equal, round, reactive to light and accommodation. No scleral icterus. Extraocular muscles intact.  HEENT: Head atraumatic, normocephalic. Oropharynx and nasopharynx clear.  NECK:  Supple, no jugular venous distention. No thyroid enlargement, no tenderness.  LUNGS: Normal breath sounds bilaterally, no wheezing, rales,rhonchi or crepitation. No use of accessory muscles of respiration.  CARDIOVASCULAR: S1, S2 normal. No murmurs, rubs, or gallops.  ABDOMEN: Soft, nontender,  nondistended. Bowel sounds present. No organomegaly or mass.  EXTREMITIES: No pedal edema, cyanosis, or clubbing.  NEUROLOGIC: Cranial nerves II through XII are intact. Muscle strength 3/5 in all extremities. Sensation intact. Gait not checked.  PSYCHIATRIC: The patient is alert and oriented x 2.  SKIN: No obvious rash, lesion, or ulcer.   Physical Exam LABORATORY PANEL:   CBC Recent Labs  Lab 12/29/17 0609  WBC 12.3*  HGB 12.0  HCT 33.6*  PLT 312   ------------------------------------------------------------------------------------------------------------------  Chemistries  Recent Labs  Lab 12/28/17 1916  12/29/17 0609  12/29/17 1725  NA 102*   < > 108*   < > 118*  K 3.3*   < > 3.4*  --   --   CL <65*   < > <65*  --   --   CO2 39*   < > 35*  --   --   GLUCOSE 108*   < > 86  --   --   BUN 20   < > 15  --   --   CREATININE 0.50   < > 0.39*  --   --   CALCIUM 8.8*   < > 8.3*  --   --   MG  --    < > 1.1*  --   --   AST 50*  --   --   --   --   ALT 47  --   --   --   --   ALKPHOS 61  --   --   --   --   BILITOT 1.3*  --   --   --   --    < > =  values in this interval not displayed.   ------------------------------------------------------------------------------------------------------------------  Cardiac Enzymes Recent Labs  Lab 12/28/17 2023  TROPONINI <0.03   ------------------------------------------------------------------------------------------------------------------  RADIOLOGY:  Dg Chest 2 View  Result Date: 12/28/2017 CLINICAL DATA:  Altered mental status and confusion. Fall out of wheelchair. EXAM: CHEST - 2 VIEW COMPARISON:  Radiograph 12/13/2017, additional priors FINDINGS: Decreased cardiomegaly from prior exam. Mediastinal contours are unchanged. There is atherosclerosis of the aortic arch. Improved pulmonary edema from prior, minimal central vascular congestion. Decreased pleural effusions from prior exam. No focal airspace disease or pneumothorax.  Bones are under mineralized, no acute osseous abnormalities. IMPRESSION: No acute findings. Pulmonary edema and pleural effusions on prior exam have resolved. Electronically Signed   By: Rubye Oaks M.D.   On: 12/28/2017 21:14   Dg Abd 1 View  Result Date: 12/29/2017 CLINICAL DATA:  Abdominal pain. EXAM: ABDOMEN - 1 VIEW COMPARISON:  12/09/2017. FINDINGS: Soft tissue structures are unremarkable. Air-filled loops of small and large bowel are noted. These findings suggest mild adynamic ileus. No acute bony abnormality identified. Degenerative changes thoracolumbar spine and both hips. Pelvic calcifications consistent phleboliths. IMPRESSION: Air-filled loops of small large bowel are noted suggesting adynamic ileus. No prominent bowel distention. No free air. Electronically Signed   By: Maisie Fus  Register   On: 12/29/2017 09:16   Ct Head Wo Contrast  Result Date: 12/28/2017 CLINICAL DATA:  Fall with laceration EXAM: CT HEAD WITHOUT CONTRAST TECHNIQUE: Contiguous axial images were obtained from the base of the skull through the vertex without intravenous contrast. COMPARISON:  None. FINDINGS: Brain: No evidence of acute infarction, hemorrhage, hydrocephalus, extra-axial collection or mass lesion/mass effect. Mild atrophy Vascular: No hyperdense vessel.  Carotid vascular calcification Skull: Normal. Negative for fracture or focal lesion. Sinuses/Orbits: No acute finding. Other: None IMPRESSION: Negative non contrasted CT appearance of the brain for age Electronically Signed   By: Jasmine Pang M.D.   On: 12/28/2017 20:56    ASSESSMENT AND PLAN:   Active Problems:   Acute hyponatremia  1.  Acute severe hyponatremia,   hypertonic saline.  Patient looks clinically volume depleted.   monitor intensive care unit for close observation and management.    check sodium level frequently.  We will hold hydrochlorothiazide and Lasix on discharge.  Appreciated help by nephrology. 2.  Acute metabolic  encephalopathy, likely related to hyponatremia.  We will continue to monitor clinically closely, while treating the underlying disorder. 3.  Left lower extremity laceration, status post repair in the emergency room. 4.  COPD, stable.  Continue maintenance medications. 5.  Hypokalemia, will replace K per protocol.    All the records are reviewed and case discussed with Care Management/Social Workerr. Management plans discussed with the patient, family and they are in agreement.  CODE STATUS: Full.  TOTAL TIME TAKING CARE OF THIS PATIENT: 35 minutes.     POSSIBLE D/C IN 1-2 DAYS, DEPENDING ON CLINICAL CONDITION.   Altamese Dilling M.D on 12/29/2017   Between 7am to 6pm - Pager - [phone]  After 6pm go to [email or web address] - password [email or web address]  [facility]  Office  [phone]  CC: Primary care physician; Dalia Heading, MD  Note: This dictation was prepared with Dragon dictation along with smaller phrase technology. Any transcriptional errors that result from this process are unintentional.

## 2017-12-29 NOTE — Consult Note (Signed)
[facility] Cardiology  CARDIOLOGY CONSULT NOTE  Patient ID: Candace Butler MRN: [id number] DOB/AGE: 79-Oct-1942 79 y.o.  Admit date: 12/28/2017 Referring Physician Salary Primary Physician Thedore Mins Primary Cardiologist Fath Reason for Consultation essential hypertension  HPI: 77 year old female referred for evaluation of essential hypertension.  Patient has known history of essential hypertension and COPD.  The patient is a resident at [facility], fell out of her wheelchair, brought to [facility] emergency room to evaluate laceration on her lower leg, noted to be markedly hyponatremic with a sodium of 102, with associated altered mental status.  Admission labs notable for normal renal function with BUN and creatinine of 20 and 0.59, respectively.  Troponin was less than 0.03.  She was placed on hypertonic saline, repeat sodium this morning was 108.  Blood pressure is 98/68.  Review of systems complete and found to be negative unless listed above     Past Medical History:  Diagnosis Date  . Asthma   . COPD (chronic obstructive pulmonary disease) (HCC)   . Hypertension     Past Surgical History:  Procedure Laterality Date  . ABDOMINAL HYSTERECTOMY    . COLON SURGERY      Medications Prior to Admission  Medication Sig Dispense Refill Last Dose  . ADVAIR DISKUS 250-50 MCG/DOSE AEPB Take 1 puff 2 (two) times daily by mouth.   12/28/2017 at 0800  . albuterol (PROVENTIL HFA;VENTOLIN HFA) 108 (90 Base) MCG/ACT inhaler Inhale 2 puffs every 6 (six) hours as needed into the lungs for wheezing or shortness of breath. 1 Inhaler 2 prn at prn  . ALPRAZolam (XANAX) 0.25 MG tablet Take 0.25 mg by mouth 3 (three) times daily as needed for anxiety.   12/26/2017 at 0225  . aspirin EC 81 MG EC tablet Take 1 tablet (81 mg total) by mouth daily. 90 tablet 0 12/28/2017 at 0800  . carvedilol (COREG) 3.125 MG tablet Take 1 tablet (3.125 mg total) by mouth 2 (two) times daily with a meal. 60 tablet 0 12/28/2017 at 0800  .  enalapril-hydrochlorothiazide (VASERETIC) 10-25 MG tablet Take 1 tablet by mouth daily. 30 tablet 0 12/28/2017 at 0800  . furosemide (LASIX) 20 MG tablet Take 3 tablets (60 mg total) by mouth daily. 30 tablet 0 12/28/2017 at 0800  . Multiple Vitamin (MULTIVITAMIN WITH MINERALS) TABS tablet Take 1 tablet by mouth daily. 90 tablet 0 12/28/2017 at 0800  . predniSONE (DELTASONE) 10 MG tablet Take 4 tablets (40 mg total) by mouth daily with breakfast. 10 tablet 0 12/28/2017 at 0800  . tiotropium (SPIRIVA) 18 MCG inhalation capsule Place 1 capsule (18 mcg total) daily into inhaler and inhale. 30 capsule 1 12/28/2017 at 0800  . traZODone (DESYREL) 50 MG tablet Take 1 tablet (50 mg total) by mouth at bedtime as needed for sleep. 30 tablet 0 prn at prn  . ALPRAZolam (XANAX) 0.5 MG tablet Take 1 tablet (0.5 mg total) by mouth 3 (three) times daily as needed for anxiety. 10 tablet 0 12/24/2017 at 1445  . feeding supplement, ENSURE ENLIVE, (ENSURE ENLIVE) LIQD Take 237 mLs by mouth 2 (two) times daily between meals. 90 Bottle 12    Social History   Socioeconomic History  . Marital status: Divorced    Spouse name: Not on file  . Number of children: Not on file  . Years of education: Not on file  . Highest education level: Not on file  Occupational History  . Not on file  Social Needs  . Financial resource strain: Not on  file  . Food insecurity:    Worry: Not on file    Inability: Not on file  . Transportation needs:    Medical: Not on file    Non-medical: Not on file  Tobacco Use  . Smoking status: Current Every Day Smoker    Types: Cigarettes  . Smokeless tobacco: Never Used  Substance and Sexual Activity  . Alcohol use: No    Frequency: Never  . Drug use: No  . Sexual activity: Not on file  Lifestyle  . Physical activity:    Days per week: Not on file    Minutes per session: Not on file  . Stress: Not on file  Relationships  . Social connections:    Talks on phone: Not on file    Gets  together: Not on file    Attends religious service: Not on file    Active member of club or organization: Not on file    Attends meetings of clubs or organizations: Not on file    Relationship status: Not on file  . Intimate partner violence:    Fear of current or ex partner: Not on file    Emotionally abused: Not on file    Physically abused: Not on file    Forced sexual activity: Not on file  Other Topics Concern  . Not on file  Social History Narrative  . Not on file    No family history on file.    Review of systems complete and found to be negative unless listed above      PHYSICAL EXAM  General: Well developed, well nourished, in no acute distress HEENT:  Normocephalic and atramatic Neck:  No JVD.  Lungs: Clear bilaterally to auscultation and percussion. Heart: HRRR . Normal S1 and S2 without gallops or murmurs.  Abdomen: Bowel sounds are positive, abdomen soft and non-tender  Msk:  Back normal, normal gait. Normal strength and tone for age. Extremities: No clubbing, cyanosis or edema.   Neuro: Alert and oriented X 3. Psych:  Good affect, responds appropriately  Labs:   Lab Results  Component Value Date   WBC 12.3 (H) 12/29/2017   HGB 12.0 12/29/2017   HCT 33.6 (L) 12/29/2017   MCV 86.9 12/29/2017   PLT 312 12/29/2017    Recent Labs  Lab 12/28/17 1916  12/29/17 0609  NA 102*   < > 108*  K 3.3*   < > 3.4*  CL <65*   < > <65*  CO2 39*   < > 35*  BUN 20   < > 15  CREATININE 0.50   < > 0.39*  CALCIUM 8.8*   < > 8.3*  PROT 7.2  --   --   BILITOT 1.3*  --   --   ALKPHOS 61  --   --   ALT 47  --   --   AST 50*  --   --   GLUCOSE 108*   < > 86   < > = values in this interval not displayed.   Lab Results  Component Value Date   TROPONINI <0.03 12/28/2017   No results found for: CHOL No results found for: HDL No results found for: LDLCALC No results found for: TRIG No results found for: CHOLHDL No results found for: LDLDIRECT    Radiology: Dg  Chest 2 View  Result Date: 12/28/2017 CLINICAL DATA:  Altered mental status and confusion. Fall out of wheelchair. EXAM: CHEST - 2 VIEW COMPARISON:  Radiograph 12/13/2017,  additional priors FINDINGS: Decreased cardiomegaly from prior exam. Mediastinal contours are unchanged. There is atherosclerosis of the aortic arch. Improved pulmonary edema from prior, minimal central vascular congestion. Decreased pleural effusions from prior exam. No focal airspace disease or pneumothorax. Bones are under mineralized, no acute osseous abnormalities. IMPRESSION: No acute findings. Pulmonary edema and pleural effusions on prior exam have resolved. Electronically Signed   By: Rubye Oaks M.D.   On: 12/28/2017 21:14   Ct Head Wo Contrast  Result Date: 12/28/2017 CLINICAL DATA:  Fall with laceration EXAM: CT HEAD WITHOUT CONTRAST TECHNIQUE: Contiguous axial images were obtained from the base of the skull through the vertex without intravenous contrast. COMPARISON:  None. FINDINGS: Brain: No evidence of acute infarction, hemorrhage, hydrocephalus, extra-axial collection or mass lesion/mass effect. Mild atrophy Vascular: No hyperdense vessel.  Carotid vascular calcification Skull: Normal. Negative for fracture or focal lesion. Sinuses/Orbits: No acute finding. Other: None IMPRESSION: Negative non contrasted CT appearance of the brain for age Electronically Signed   By: Jasmine Pang M.D.   On: 12/28/2017 20:56   US Venous Img Lower Bilateral  Result Date: 12/08/2017 CLINICAL DATA:  77 year old female with bilateral lower extremity swelling for the past 3-4 days and shortness of breath EXAM: BILATERAL LOWER EXTREMITY VENOUS DOPPLER ULTRASOUND TECHNIQUE: Gray-scale sonography with graded compression, as well as color Doppler and duplex ultrasound were performed to evaluate the lower extremity deep venous systems from the level of the common femoral vein and including the common femoral, femoral, profunda femoral, popliteal  and calf veins including the posterior tibial, peroneal and gastrocnemius veins when visible. The superficial great saphenous vein was also interrogated. Spectral Doppler was utilized to evaluate flow at rest and with distal augmentation maneuvers in the common femoral, femoral and popliteal veins. COMPARISON:  None. FINDINGS: RIGHT LOWER EXTREMITY Common Femoral Vein: No evidence of thrombus. Normal compressibility, respiratory phasicity and response to augmentation. Saphenofemoral Junction: No evidence of thrombus. Normal compressibility and flow on color Doppler imaging. Profunda Femoral Vein: No evidence of thrombus. Normal compressibility and flow on color Doppler imaging. Femoral Vein: No evidence of thrombus. Normal compressibility, respiratory phasicity and response to augmentation. Popliteal Vein: No evidence of thrombus. Normal compressibility, respiratory phasicity and response to augmentation. Calf Veins: No evidence of thrombus. Normal compressibility and flow on color Doppler imaging. Superficial Great Saphenous Vein: No evidence of thrombus. Normal compressibility. Venous Reflux:  None. Other Findings:  None. LEFT LOWER EXTREMITY Common Femoral Vein: No evidence of thrombus. Normal compressibility, respiratory phasicity and response to augmentation. Saphenofemoral Junction: No evidence of thrombus. Normal compressibility and flow on color Doppler imaging. Profunda Femoral Vein: No evidence of thrombus. Normal compressibility and flow on color Doppler imaging. Femoral Vein: No evidence of thrombus. Normal compressibility, respiratory phasicity and response to augmentation. Popliteal Vein: No evidence of thrombus. Normal compressibility, respiratory phasicity and response to augmentation. Calf Veins: No evidence of thrombus. Normal compressibility and flow on color Doppler imaging. Superficial Great Saphenous Vein: No evidence of thrombus. Normal compressibility. Venous Reflux:  None. Other Findings:   None. IMPRESSION: No evidence of deep venous thrombosis. Electronically Signed   By: Malachy Moan M.D.   On: 12/08/2017 15:56   Dg Chest Port 1 View  Result Date: 12/13/2017 CLINICAL DATA:  Respiratory failure EXAM: PORTABLE CHEST 1 VIEW COMPARISON:  Chest radiograph 12/10/2017 FINDINGS: Cardiomegaly with mild pulmonary edema. Small left pleural effusion. Unchanged cardiomegaly. Endotracheal tube has been removed. IMPRESSION: Mild cardiomegaly and mild pulmonary edema. Electronically Signed   By: Caryn Bee  Chase Picket M.D.   On: 12/13/2017 16:14   Dg Chest Port 1 View  Result Date: 12/10/2017 CLINICAL DATA:  Pneumonia. EXAM: PORTABLE CHEST 1 VIEW COMPARISON:  Chest x-ray from yesterday. FINDINGS: Endotracheal tube remains in good position. Unchanged enteric tube entering the stomach with the tip below the field of view. Stable cardiomediastinal silhouette. Coarsened interstitial markings are similar to prior study. Mild atelectasis at the left lung base. No focal consolidation, pleural effusion, or pneumothorax. No acute osseous abnormality. IMPRESSION: No active disease. Electronically Signed   By: Obie Dredge M.D.   On: 12/10/2017 07:10   Dg Chest Port 1 View  Result Date: 12/09/2017 CLINICAL DATA:  Pneumonia EXAM: PORTABLE CHEST 1 VIEW COMPARISON:  12/08/2017, 06/13/2017 FINDINGS: Endotracheal tube tip is about 2.7 cm superior to the carina. Esophageal tube tip is below the diaphragm but not included on the image. Stable cardiomediastinal silhouette with aortic atherosclerosis. Hazy atelectasis versus minimal infiltrate at the left base. No pneumothorax. Stable chronic interstitial opacity. IMPRESSION: 1. Endotracheal tube tip about 2.7 cm superior to the carina 2. Hazy left base atelectasis versus minimal infiltrate Electronically Signed   By: Jasmine Pang M.D.   On: 12/09/2017 02:55   Dg Chest Portable 1 View  Result Date: 12/08/2017 CLINICAL DATA:  Asthma-COPD.  Recent intubation. EXAM: PORTABLE  CHEST 1 VIEW COMPARISON:  Chest x-ray of earlier today. FINDINGS: The lungs are well-expanded. The interstitial markings are coarse. There is no pleural effusion, pneumothorax, or pneumomediastinum. The endotracheal tube tip projects at or just above the carina. The esophagogastric tube tip and proximal port project below the GE junction. The heart and pulmonary vascularity are normal. There is calcification in the wall of the aortic arch. IMPRESSION: Mild hyperinflation consistent with known reactive airway disease. No alveolar pneumonia. The endotracheal tube tip is at or just above the carina. Withdrawal by 2-3 cm is recommended. Reasonable positioning of the esophagogastric tube. Electronically Signed   By: David  [place] M.D.   On: 12/08/2017 09:27   Dg Chest Portable 1 View  Result Date: 12/08/2017 CLINICAL DATA:  Shortness of breath.  History of COPD. EXAM: PORTABLE CHEST 1 VIEW COMPARISON:  06/13/2017 FINDINGS: The cardiac silhouette is upper limits of normal in size. Aortic atherosclerosis is noted. There is peribronchial thickening and chronic appearing interstitial coarsening which are slightly more prominent than on the prior study. Minimal scarring or atelectasis is present in the left lung base. No overt pulmonary edema, segmental consolidation, sizeable pleural effusion, or pneumothorax is identified. No acute osseous abnormality is seen. IMPRESSION: Increased bronchitic changes. Electronically Signed   By: Sebastian Ache M.D.   On: 12/08/2017 08:12   Dg Abd Portable 1v  Result Date: 12/09/2017 CLINICAL DATA:  77 y/o  F; enteric tube placement. EXAM: PORTABLE ABDOMEN - 1 VIEW COMPARISON:  None. FINDINGS: The bowel gas pattern is normal. No radio-opaque calculi or other significant radiographic abnormality are seen. Enteric tube tip projects over the distal stomach. IMPRESSION: Enteric tube tip projects over distal stomach. Electronically Signed   By: Mitzi Hansen M.D.   On: 12/09/2017  22:38   Dg Abd Portable 1 View  Result Date: 12/08/2017 CLINICAL DATA:  77 year old female enteric tube placement. EXAM: PORTABLE ABDOMEN - 1 VIEW COMPARISON:  Portable chest 0727 hours today. FINDINGS: AP view at 0852 hours. An enteric tube has been placed and courses into the left abdomen. The tip is not identified, but the side hole may be visible at the level of the gastric body (  arrow). Mild respiratory motion artifact at the lung bases were ventilation appears stable. Superimposed EKG leads. Paucity of bowel gas in the upper abdomen. IMPRESSION: Enteric tube courses into the stomach, side hole probably at the gastric body level. Electronically Signed   By: Odessa Fleming M.D.   On: 12/08/2017 09:27    EKG: Normal sinus rhythm  ASSESSMENT AND PLAN:   1.  Hyponatremia, in the setting of decreased p.o. intake, volume depletion, exacerbated by diuretic therapy, slowly improving on hypertonic saline 2.  Essential hypertension, blood pressure currently low  Recommendations  1.  Agree with overall current therapy 2.  Continue sodium repletion 3.  Continue IV fluid resuscitation 4.  Hold diuretic 5.  Hold enalapril 6.  Resume carvedilol when patient safely taking p.o. Medications 7.  Defer further cardiac diagnostics  Signed: Marcina Millard MD,PhD, [facility] 12/29/2017, 9:07 AM

## 2017-12-29 NOTE — Progress Notes (Signed)
Pharmacy Electrolyte Monitoring Consult:  Pharmacy consulted to assist in monitoring and replacing electrolytes in this 77 y.o. female admitted on 12/28/2017 with hyponatremia secondary to over diuresis.   Patient previously ordered hypertonic saline.   Labs:  Sodium (mmol/L)  Date Value  12/29/2017 118 (LL)   Potassium (mmol/L)  Date Value  12/29/2017 3.4 (L)   Magnesium (mg/dL)  Date Value  16/05/9603 1.1 (L)   Phosphorus (mg/dL)  Date Value  54/04/8118 2.0 (L)   Calcium (mg/dL)  Date Value  14/78/2956 8.3 (L)   Albumin (g/dL)  Date Value  21/30/8657 4.1    Assessment/Plan: Magnesium 4g IV x 1 and potassium phosphate IV x 1. Will recheck electrolytes with am labs.   Pharmacy will continue to monitor and adjust per consult.   Simpson,Michael L 12/29/2017 8:13 PM

## 2017-12-30 DIAGNOSIS — E876 Hypokalemia: Secondary | ICD-10-CM

## 2017-12-30 DIAGNOSIS — S81812A Laceration without foreign body, left lower leg, initial encounter: Secondary | ICD-10-CM

## 2017-12-30 DIAGNOSIS — J449 Chronic obstructive pulmonary disease, unspecified: Secondary | ICD-10-CM

## 2017-12-30 DIAGNOSIS — S8012XA Contusion of left lower leg, initial encounter: Secondary | ICD-10-CM

## 2017-12-30 DIAGNOSIS — E871 Hypo-osmolality and hyponatremia: Principal | ICD-10-CM

## 2017-12-30 DIAGNOSIS — Z7189 Other specified counseling: Secondary | ICD-10-CM

## 2017-12-30 LAB — CBC
HEMATOCRIT: 35.4 % (ref 35.0–47.0)
HEMOGLOBIN: 12.4 g/dL (ref 12.0–16.0)
MCH: 31.5 pg (ref 26.0–34.0)
MCHC: 34.9 g/dL (ref 32.0–36.0)
MCV: 90.1 fL (ref 80.0–100.0)
Platelets: 347 10*3/uL (ref 150–440)
RBC: 3.92 MIL/uL (ref 3.80–5.20)
RDW: 13.7 % (ref 11.5–14.5)
WBC: 9.4 10*3/uL (ref 3.6–11.0)

## 2017-12-30 LAB — BASIC METABOLIC PANEL
ANION GAP: 7 (ref 5–15)
BUN: 8 mg/dL (ref 6–20)
CALCIUM: 8.9 mg/dL (ref 8.9–10.3)
CO2: 40 mmol/L — AB (ref 22–32)
CREATININE: 0.53 mg/dL (ref 0.44–1.00)
Chloride: 74 mmol/L — ABNORMAL LOW (ref 101–111)
Glucose, Bld: 112 mg/dL — ABNORMAL HIGH (ref 65–99)
Potassium: 3 mmol/L — ABNORMAL LOW (ref 3.5–5.1)
SODIUM: 121 mmol/L — AB (ref 135–145)

## 2017-12-30 LAB — SODIUM
SODIUM: 119 mmol/L — AB (ref 135–145)
SODIUM: 120 mmol/L — AB (ref 135–145)
Sodium: 123 mmol/L — ABNORMAL LOW (ref 135–145)
Sodium: 119 mmol/L — CL (ref 135–145)
Sodium: 121 mmol/L — ABNORMAL LOW (ref 135–145)

## 2017-12-30 LAB — GLUCOSE, CAPILLARY: Glucose-Capillary: 116 mg/dL — ABNORMAL HIGH (ref 65–99)

## 2017-12-30 LAB — MAGNESIUM: Magnesium: 2.5 mg/dL — ABNORMAL HIGH (ref 1.7–2.4)

## 2017-12-30 LAB — PHOSPHORUS: PHOSPHORUS: 3 mg/dL (ref 2.5–4.6)

## 2017-12-30 MED ORDER — DEXTROSE 5 % IV SOLN
INTRAVENOUS | Status: DC
  Administered 2017-12-30: 50 mL/h via INTRAVENOUS

## 2017-12-30 MED ORDER — POLYVINYL ALCOHOL 1.4 % OP SOLN
1.0000 [drp] | OPHTHALMIC | Status: DC | PRN
  Administered 2017-12-31: 1 [drp] via OPHTHALMIC
  Filled 2017-12-30: qty 15

## 2017-12-30 MED ORDER — CARVEDILOL 3.125 MG PO TABS
3.1250 mg | ORAL_TABLET | Freq: Two times a day (BID) | ORAL | Status: DC
  Administered 2017-12-30: 3.125 mg via ORAL
  Filled 2017-12-30 (×3): qty 1

## 2017-12-30 MED ORDER — POTASSIUM CHLORIDE IN NACL 20-0.9 MEQ/L-% IV SOLN
INTRAVENOUS | Status: DC
  Administered 2017-12-30 – 2017-12-31 (×2): via INTRAVENOUS
  Filled 2017-12-30 (×3): qty 1000

## 2017-12-30 MED ORDER — POTASSIUM CHLORIDE 10 MEQ/100ML IV SOLN
10.0000 meq | INTRAVENOUS | Status: AC
  Administered 2017-12-30 (×5): 10 meq via INTRAVENOUS
  Filled 2017-12-30 (×5): qty 100

## 2017-12-30 NOTE — Progress Notes (Signed)
Name: Candace Butler MRN: [id number] DOB: [birth year]    ADMISSION DATE:  12/28/2017 CONSULTATION DATE: 12/28/2017  REFERRING MD : Dr. Caryn Bee   CHIEF COMPLAINT: Fall   BRIEF PATIENT DESCRIPTION:  77 yo female from [facility] admitted following a fall found to have severe hypovolemic hyponatremia and hypochloridemia likely medication induced requiring hypertonic saline 3% gtt  SIGNIFICANT EVENTS/STUDIES:  05/22 Pt admitted to ICU  05/22 CT Head negative non contrasted CT appearance of the brain for age  HISTORY OF PRESENT ILLNESS:   This is a 77 yo female with a PMH of HTN, COPD, Chronic O2 , and Asthma.  She presented to [facility] ER on 05/22 via EMS from [facility] after falling out of her wheelchair, and the wheelchair fell on top of her head resulting in a left shin laceration and abrasion to her left knee. Per ER notes upon arrival to the ER the pt was alert and oriented x4, however her eyes were frequently rolling in the back of her head.  CT Head, blood alcohol level, and CXR negative.  Lab results revealed Na+ 102, K+ 3.3, chloride <65, CO2 39, lactic acid 1.1, and UA negative.  During previous hospitalization on 12/17/2017 her serum sodium was 132.  She was discharged from [facility] on 12/19/17 and  instructed to continue enalapril-hydrochlorothiazide and started on 60 mg furosemide daily.  During current hospitalization nephrology consulted plans to start hypertonic saline 3% gtt.  She was subsequently admitted to ICU for further workup and treatment.   REVIEW OF SYSTEMS:  Constitutional: Negative for fever, chills, weight loss, malaise/fatigue and diaphoresis.  HENT: Negative for hearing loss, ear pain, nosebleeds, congestion, sore throat, neck pain, tinnitus and ear discharge.   Eyes: Negative for blurred vision, double vision, photophobia, pain, discharge and redness.  Respiratory: Negative for cough, hemoptysis, sputum production, shortness of breath, wheezing and stridor.     Cardiovascular: Negative for chest pain, palpitations, orthopnea, claudication, leg swelling and PND.  Gastrointestinal: Negative for heartburn, nausea, vomiting, abdominal pain, diarrhea, constipation, blood in stool and melena.  Genitourinary: Negative for dysuria, urgency, frequency, hematuria and flank pain.  Musculoskeletal: Negative for myalgias, back pain, joint pain and falls.  Skin: Negative for itching and rash.  Neurological: Awake, alert and appropriate  Endo/Heme/Allergies: Negative for environmental allergies and polydipsia. Does not bruise/bleed easily.  SUBJECTIVE:  Pt states she is confused   VITAL SIGNS: Temp:  [98.1 F (36.7 C)-98.8 F (37.1 C)] 98.8 F (37.1 C) (05/24 1200) Pulse Rate:  [61-109] 100 (05/24 1500) Resp:  [11-30] 17 (05/24 1500) BP: (98-140)/(45-86) 140/57 (05/24 1500) SpO2:  [91 %-100 %] 100 % (05/24 1500) Weight:  [160 lb 0.9 oz (72.6 kg)] 160 lb 0.9 oz (72.6 kg) (05/24 0500)  PHYSICAL EXAMINATION: General: acutely ill appearing female, NAD  Neuro: alert oriented to self only, follows commands, PERRL, eyes rolled backwards HEENT: supple, no JVD  Cardiovascular: sinus rhythm with PVC's, no R/G Lungs: diminished throughout, even, non labored  Abdomen: +BS x4, obese, soft, mild tenderness, non distended  Musculoskeletal: normal bulk and tone, no edema  Skin: laceration to left shin and abrasion stitches in place   Recent Labs  Lab 12/28/17 2009  12/29/17 0609  12/30/17 0511 12/30/17 0847 12/30/17 1349  NA 102*   < > 108*   < > 121* 119* 119*  K 3.2*  --  3.4*  --  3.0*  --   --   CL <65*  --  <65*  --  74*  --   --  CO2 39*  --  35*  --  40*  --   --   BUN 20  --  15  --  8  --   --   CREATININE 0.59  --  0.39*  --  0.53  --   --   GLUCOSE 110*  --  86  --  112*  --   --    < > = values in this interval not displayed.   Recent Labs  Lab 12/28/17 1916 12/29/17 0609 12/30/17 0511  HGB 13.0 12.0 12.4  HCT 35.4 33.6* 35.4  WBC  12.6* 12.3* 9.4  PLT 332 312 347   Dg Chest 2 View  Result Date: 12/28/2017 CLINICAL DATA:  Altered mental status and confusion. Fall out of wheelchair. EXAM: CHEST - 2 VIEW COMPARISON:  Radiograph 12/13/2017, additional priors FINDINGS: Decreased cardiomegaly from prior exam. Mediastinal contours are unchanged. There is atherosclerosis of the aortic arch. Improved pulmonary edema from prior, minimal central vascular congestion. Decreased pleural effusions from prior exam. No focal airspace disease or pneumothorax. Bones are under mineralized, no acute osseous abnormalities. IMPRESSION: No acute findings. Pulmonary edema and pleural effusions on prior exam have resolved. Electronically Signed   By: Rubye Oaks M.D.   On: 12/28/2017 21:14   Dg Abd 1 View  Result Date: 12/29/2017 CLINICAL DATA:  Abdominal pain. EXAM: ABDOMEN - 1 VIEW COMPARISON:  12/09/2017. FINDINGS: Soft tissue structures are unremarkable. Air-filled loops of small and large bowel are noted. These findings suggest mild adynamic ileus. No acute bony abnormality identified. Degenerative changes thoracolumbar spine and both hips. Pelvic calcifications consistent phleboliths. IMPRESSION: Air-filled loops of small large bowel are noted suggesting adynamic ileus. No prominent bowel distention. No free air. Electronically Signed   By: Maisie Fus  Register   On: 12/29/2017 09:16   Ct Head Wo Contrast  Result Date: 12/28/2017 CLINICAL DATA:  Fall with laceration EXAM: CT HEAD WITHOUT CONTRAST TECHNIQUE: Contiguous axial images were obtained from the base of the skull through the vertex without intravenous contrast. COMPARISON:  None. FINDINGS: Brain: No evidence of acute infarction, hemorrhage, hydrocephalus, extra-axial collection or mass lesion/mass effect. Mild atrophy Vascular: No hyperdense vessel.  Carotid vascular calcification Skull: Normal. Negative for fracture or focal lesion. Sinuses/Orbits: No acute finding. Other: None IMPRESSION:  Negative non contrasted CT appearance of the brain for age Electronically Signed   By: Jasmine Pang M.D.   On: 12/28/2017 20:56    ASSESSMENT / PLAN:  1.Acute severe hyponatremia. It appears to be related to diuretics therapy. Plan: continue management as per Nephrologist.  Caution not to raise Na= > 12 meq/24 hours   2.Acute metabolic encephalopathy,likely related to hyponatremia.MS is much improved. Plan: continue ICU monitor for another 24 hours however change to step down status.  3.Left lower extremity laceration, status post repair in the emergency room. Plan: local care  4.Hx. COPD without exacerbation. Plan: Continue maintenance medications.  5.Hypokalemia on replacement protocol      Jackson Latino, MD  Pulmonary/Critical Care Pager [phone] (please enter 7 digits) PCCM Consult Pager [phone] (please enter 7 digits)

## 2017-12-30 NOTE — Progress Notes (Addendum)
SLP Cancellation Note  Patient Details Name: Candace Butler MRN: [id number] DOB: [birth year]   Cancelled treatment:       Reason Eval/Treat Not Completed: Fatigue/lethargy limiting ability to participate(chart reviewed; pt resting. Dtr present.). NSG consulted; noted Sodium is improving. Discussed w/ Dtr pt's improved alertness and reduced appetite for foods for ~1 week now. Modified pt's diet to a regular diet w/ cut (tougher) meats, moistened. Dtr will monitor pt's foods and choose easy to eat, softer foods (avoid salads). Hopefully this will encourage pt's appetite. ST services will f/u w/ toleration of diet next 1-3 days. NSG updated.    Jerilynn Som, MS, CCC-SLP [facility] Drewes 12/30/2017, 2:47 PM

## 2017-12-30 NOTE — Consult Note (Signed)
Consultation Note Date: 12/30/2017   Patient Name: Candace Butler  DOB: 1941-01-01  MRN: [id number]  Age / Sex: 77 y.o., female  PCP: Glendon Axe, MD Referring Physician: Vaughan Basta, *  Reason for Consultation: Establishing goals of care  HPI/Patient Profile: 77 y.o. female admitted on 12/28/2017 from [facility] with increased confusion and also after falling out of her wheelchair and obtaining a left shin laceration and abrasion to her left knee. She has a past medical history significant for hypertension, COPD, chronic home O2 _0 -4L/[place], and asthma.She was discharged from [facility] on 12/19/17 and  instructed to continue enalapril-hydrochlorothiazide and started on 60 mg furosemide daily.  According to reports the patient did not lose consciousness during fall. During her ED course blood test were remarkable for low sodium level at 102, low chloride level, below 65 and low potassium level at 3.2.  A recent sodium level from 10 days ago was 138.  Chest x-ray and brain CT were without any acute changes. Palliative Medicine team consulted for goals of care discussion.   Clinical Assessment and Goals of Care: I have reviewed medical records including lab results, imaging, Epic notes, and MAR, received report from the bedside RN, and assessed the patient. I then met at the bedside with her daughter, Candace Butler) to discuss diagnosis prognosis, GOC, EOL wishes, disposition and options.  She is alert and oriented x4.  She is able to engage in goals of care discussion with her daughter and I.  I introduced Palliative Medicine as specialized medical care for people living with serious illness. It focuses on providing relief from the symptoms and stress of a serious illness. The goal is to improve quality of life for both the patient and the family.   We discussed a brief life review of the patient.  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  She has 4 children who all live locally, however did be is her primary caregiver and documented POA.  He enjoys crocheting and sitting outside.  As far as functional and nutritional status daughter verbalized that she has noticed a decline in her mother's health over the past several years, with a more recent decline in the past 6 months.  Her to admission patient was living home alone and able to provide most of her care.  Her daughter moved her from [place] about 2-1/2 years ago when noticing a decline in her health.  Both patient and her daughter reports she generally would not have a healthy appetite however over the past 2 to 3 months there is a significant decline in her appetite and p.o. intake.  Daughter states the past several months she has often stated she is tired, including too tired to eat or do any activity.  She was recently discharged from the hospital and placed at [facility] for therapy which she  was not tolerating well.  Patient and daughter both states therapy was too much and she would often be exhausted.  Daughter states prior to rehab [name] would stay confined to her home by choice and the family could only get her to leave if she had a medical appointment.   We discussed her current illness and what it means in the larger context of her on-going co-morbidities.  Natural disease trajectory and expectations at EOL were discussed.  I attempted to elicit values and goals of care important to the patient.    The difference between aggressive medical intervention and comfort care was considered in light of the patient's goals of care.  This time both patient and family would like to continue to treat the treatable.  Patient verbalized that she knows that her time is limited on  this earth.  She states she is tired and weak, and her body has almost had enough.  Daughter became tearful during this conversation.  Advanced directives, concepts specific to code status, artifical feeding and hydration, and rehospitalization were considered and discussed.  She has requested to be a DNR/DNI.  She verbalized she is not interested in any life-sustaining measures including artificial feedings such as PEG tubes or dialysis treatments.  Daughter verbalized understanding of her mother's wishes.  Bedside RN also in room when patient discussed her wishes to be a DNR/DNI.  Hospice and Palliative Care services outpatient were explained and offered.  Patient and family has requested hospice outpatient services at the time of discharge.  Briefly discussed the philosophy of hospice and family and patient verbalized understanding.  Questions and concerns were addressed.  The family was encouraged to call with questions or concerns.  PMT will continue to support holistically.  HCPOA-Candace Butler (daughter).  He is alert and oriented x3 and can make medical decisions at this time.    SUMMARY OF RECOMMENDATIONS    DNR/DNI at patient's request.  Continue to treat the treatable while hospitalized.  Case Management consult for outpatient hospice services at discharge.  Prior to admission [facility] for rehabilitation, patient was living alone however given her current health status patient will not be able to live alone at discharge.  This was discussed with daughter verbalized there is no one that would be able to provide 24-hour assistance and care.  Family requests for possible assistance with placement in ALF or long-term care facility.  She has verbalized she would not be able to participate in any further rehabilitation.  Polyvinyl alcohol drops as needed to assist with dry eyes.  Palliative medicine team will continue to support patient, family, medical team during hospitalization as  needed.  Code Status/Advance Care Planning:  DNR/DNI at patient/family's request    Symptom Management:   Liquifilm tears 1 drop to both eyes as needed for dry eyes.  Palliative Prophylaxis:   Bowel Regimen, Delirium Protocol, Eye Care, Frequent Pain Assessment and Oral Care  Additional Recommendations (Limitations, Scope, Preferences):  Full Scope Treatment to treat the treatable during hospitalization at patient and family request.  Psycho-social/Spiritual:   Desire for further Chaplaincy support:NO   Prognosis:   Unable to determine guarded to poor in the setting of asthma, COPD, hypertension, poor p.o. intake, decreased mobility, generalized weakness.  Discharge Planning: To Be Determined w/hospice services.       Primary Diagnoses: Present on Admission: . Acute hyponatremia   I have reviewed the medical record, interviewed the patient and family, and examined the patient. The following aspects are  pertinent.  Past Medical History:  Diagnosis Date  . Asthma   . COPD (chronic obstructive pulmonary disease) ([place])   . Hypertension    Social History   Socioeconomic History  . Marital status: Divorced    Spouse name: Not on file  . Number of children: Not on file  . Years of education: Not on file  . Highest education level: Not on file  Occupational History  . Not on file  Social Needs  . Financial resource strain: Not on file  . Food insecurity:    Worry: Not on file    Inability: Not on file  . Transportation needs:    Medical: Not on file    Non-medical: Not on file  Tobacco Use  . Smoking status: Former Smoker    Types: Cigarettes  . Smokeless tobacco: Never Used  Substance and Sexual Activity  . Alcohol use: No    Frequency: Never  . Drug use: No  . Sexual activity: Not Currently  Lifestyle  . Physical activity:    Days per week: Not on file    Minutes per session: Not on file  . Stress: Not on file  Relationships  . Social  connections:    Talks on phone: Not on file    Gets together: Not on file    Attends religious service: Not on file    Active member of club or organization: Not on file    Attends meetings of clubs or organizations: Not on file    Relationship status: Not on file  Other Topics Concern  . Not on file  Social History Narrative  . Not on file   History reviewed. No pertinent family history. Scheduled Meds: . aspirin EC  81 mg Oral Daily  . carvedilol  3.125 mg Oral BID WC  . docusate sodium  100 mg Oral BID  . feeding supplement (ENSURE ENLIVE)  237 mL Oral BID BM  . fluticasone furoate-vilanterol  1 puff Inhalation Daily  . heparin  5,000 Units Subcutaneous Q8H  . multivitamin with minerals  1 tablet Oral Daily  . predniSONE  40 mg Oral Q breakfast  . tiotropium  18 mcg Inhalation Daily   Continuous Infusions: . 0.9 % NaCl with KCl 20 mEq / L 50 mL/hr at 12/30/17 1400  . potassium chloride 10 mEq (12/30/17 1539)   PRN Meds:.acetaminophen **OR** acetaminophen, albuterol, ALPRAZolam, bisacodyl, hydrALAZINE, LORazepam, ondansetron **OR** ondansetron (ZOFRAN) IV, traZODone Medications Prior to Admission:  Prior to Admission medications   Medication Sig Start Date End Date Taking? Authorizing Provider  ADVAIR DISKUS 250-50 MCG/DOSE AEPB Take 1 puff 2 (two) times daily by mouth. 05/28/17  Yes [provider]  albuterol (PROVENTIL HFA;VENTOLIN HFA) 108 (90 Base) MCG/ACT inhaler Inhale 2 puffs every 6 (six) hours as needed into the lungs for wheezing or shortness of breath. 06/16/17  Yes Demetrios Loll, MD  ALPRAZolam Duanne Moron) 0.25 MG tablet Take 0.25 mg by mouth 3 (three) times daily as needed for anxiety.   Yes [provider]  aspirin EC 81 MG EC tablet Take 1 tablet (81 mg total) by mouth daily. 12/20/17  Yes Salary, Avel Peace, MD  carvedilol (COREG) 3.125 MG tablet Take 1 tablet (3.125 mg total) by mouth 2 (two) times daily with a meal. 12/19/17  Yes Salary, Montell D, MD    enalapril-hydrochlorothiazide (VASERETIC) 10-25 MG tablet Take 1 tablet by mouth daily. 12/19/17  Yes Salary, Avel Peace, MD  furosemide (LASIX) 20 MG tablet Take 3  tablets (60 mg total) by mouth daily. 12/20/17  Yes Salary, Avel Peace, MD  Multiple Vitamin (MULTIVITAMIN WITH MINERALS) TABS tablet Take 1 tablet by mouth daily. 12/20/17  Yes Salary, Avel Peace, MD  predniSONE (DELTASONE) 10 MG tablet Take 4 tablets (40 mg total) by mouth daily with breakfast. 12/20/17  Yes Salary, Montell D, MD  tiotropium (SPIRIVA) 18 MCG inhalation capsule Place 1 capsule (18 mcg total) daily into inhaler and inhale. 06/18/17  Yes Demetrios Loll, MD  traZODone (DESYREL) 50 MG tablet Take 1 tablet (50 mg total) by mouth at bedtime as needed for sleep. 12/19/17  Yes Salary, Avel Peace, MD  ALPRAZolam (XANAX) 0.5 MG tablet Take 1 tablet (0.5 mg total) by mouth 3 (three) times daily as needed for anxiety. 12/19/17   Salary, Avel Peace, MD  feeding supplement, ENSURE ENLIVE, (ENSURE ENLIVE) LIQD Take 237 mLs by mouth 2 (two) times daily between meals. 12/19/17   Salary, Avel Peace, MD   Allergies  Allergen Reactions  . Indomethacin   . Vicodin [Hydrocodone-Acetaminophen]    Review of Systems  Constitutional: Positive for activity change and fatigue.  Respiratory: Positive for shortness of breath.   Neurological: Positive for weakness.  All other systems reviewed and are negative.   Physical Exam  Constitutional: She is oriented to person, place, and time. Vital signs are normal. She is cooperative. She appears ill.  Cardiovascular: Normal rate, regular rhythm, normal heart sounds, intact distal pulses and normal pulses.  Pulmonary/Chest: She has decreased breath sounds.  Shortness of breath   Abdominal: Normal appearance and bowel sounds are normal.  Musculoskeletal:  Generalized weakness   Neurological: She is alert and oriented to person, place, and time.  Skin:  IV R foot, skin tear to right upper arm, bruise to left  lower arm, laceration to left leg   Psychiatric: She has a normal mood and affect. Her speech is normal. Judgment normal. Cognition and memory are normal.  Nursing note and vitals reviewed.   Vital Signs: BP (!) 140/57   Pulse 100   Temp 98.8 F (37.1 C) (Oral)   Resp 17   Ht _0  (1.575 m)   Wt 72.6 kg (160 lb 0.9 oz)   SpO2 100%   BMI 29.27 kg/m  Pain Scale: CPOT   Pain Score: 0-No pain   SpO2: SpO2: 100 % O2 Device:SpO2: 100 % O2 Flow Rate: .O2 Flow Rate (L/min): 3 L/min  IO: Intake/output summary:   Intake/Output Summary (Last 24 hours) at 12/30/2017 1553 Last data filed at 12/30/2017 1400 Gross per 24 hour  Intake 824.5 ml  Output 610 ml  Net 214.5 ml    LBM: Last BM Date: 12/30/17 Baseline Weight: Weight: 74.8 kg (165 lb) Most recent weight: Weight: 72.6 kg (160 lb 0.9 oz)     Palliative Assessment/Data:PPS 30%   Time In: 1430 Time Out: 1545 Time Total: 75 min.  Greater than 50%  of this time was spent counseling and coordinating care related to the above assessment and plan.  Signed by: Alda Lea, NP-BC Palliative Medicine Team  Phone: [phone] Fax: [phone]   Please contact Palliative Medicine Team phone at [phone] for questions and concerns.  For individual provider: See Shea Evans

## 2017-12-30 NOTE — Progress Notes (Signed)
[facility], [place] 12/30/17  Subjective:   Na level was correcting fast therefore patient started on low dose D5w Na this morning 121   Objective:  Vital signs in last 24 hours:  Temp:  [97.5 F (36.4 C)-98.7 F (37.1 C)] 98.6 F (37 C) (05/24 0800) Pulse Rate:  [61-93] 93 (05/24 1000) Resp:  [11-23] 13 (05/24 1000) BP: (81-138)/(42-86) 98/45 (05/24 1000) SpO2:  [91 %-100 %] 91 % (05/24 1000) Weight:  [72.6 kg (160 lb 0.9 oz)] 72.6 kg (160 lb 0.9 oz) (05/24 0500)  Weight change: -2.244 kg (-4 lb 15.1 oz) Filed Weights   12/28/17 2223 12/29/17 0512 12/30/17 0500  Weight: 74.8 kg (164 lb 14.5 oz) 73.4 kg (161 lb 13.1 oz) 72.6 kg (160 lb 0.9 oz)    Intake/Output:    Intake/Output Summary (Last 24 hours) at 12/30/2017 1034 Last data filed at 12/30/2017 0800 Gross per 24 hour  Intake 1387 ml  Output 860 ml  Net 527 ml     Physical Exam: General:  No acute distress, laying in the bed  HEENT  anicteric, dry oral mucous membranes  Neck  supple   Pulm/lungs normal breathing effort, nasal cannula oxygen, clear  CVS/Heart  regular rate and rhythm  Abdomen:   Soft, nontender  Extremities:  No peripheral edema  Neurologic:  Alert, able to follow few commands, orieinted to self  Skin: No acute rashes          Basic Metabolic Panel:  Recent Labs  Lab 12/28/17 1916 12/28/17 2009  12/29/17 0609  12/29/17 1725 12/29/17 2107 12/30/17 0038 12/30/17 0511 12/30/17 0847  NA 102* 102*   < > 108*   < > 118* 119* 123* 121* 119*  K 3.3* 3.2*  --  3.4*  --   --   --   --  3.0*  --   CL <65* <65*  --  <65*  --   --   --   --  74*  --   CO2 39* 39*  --  35*  --   --   --   --  40*  --   GLUCOSE 108* 110*  --  86  --   --   --   --  112*  --   BUN 20 20  --  15  --   --   --   --  8  --   CREATININE 0.50 0.59  --  0.39*  --   --   --   --  0.53  --   CALCIUM 8.8* 8.8*  --  8.3*  --   --   --   --  8.9  --   MG  --  1.3*  --  1.1*  --   --   --    --  2.5*  --   PHOS  --   --   --  2.0*  --   --   --   --  3.0  --    < > = values in this interval not displayed.     CBC: Recent Labs  Lab 12/28/17 1916 12/29/17 0609 12/30/17 0511  WBC 12.6* 12.3* 9.4  HGB 13.0 12.0 12.4  HCT 35.4 33.6* 35.4  MCV 86.5 86.9 90.1  PLT 332 312 347     No results found for: HEPBSAG, HEPBSAB, HEPBIGM    Microbiology:  Recent Results (from the past 240 hour(s))  MRSA PCR Screening  Status: None   Collection Time: 12/28/17 10:27 PM  Result Value Ref Range Status   MRSA by PCR NEGATIVE NEGATIVE Final    Comment:        The GeneXpert MRSA Assay (FDA approved for NASAL specimens only), is one component of a comprehensive MRSA colonization surveillance program. It is not intended to diagnose MRSA infection nor to guide or monitor treatment for MRSA infections. Performed at [facility], [address] [place], [place] [address]     Coagulation Studies: No results for input(s): LABPROT, INR in the last 72 hours.  Urinalysis: Recent Labs    12/28/17 2010  COLORURINE YELLOW*  LABSPEC 1.012  PHURINE 6.0  GLUCOSEU NEGATIVE  HGBUR NEGATIVE  BILIRUBINUR NEGATIVE  KETONESUR NEGATIVE  PROTEINUR NEGATIVE  NITRITE NEGATIVE  LEUKOCYTESUR NEGATIVE      Imaging: Dg Chest 2 View  Result Date: 12/28/2017 CLINICAL DATA:  Altered mental status and confusion. Fall out of wheelchair. EXAM: CHEST - 2 VIEW COMPARISON:  Radiograph 12/13/2017, additional priors FINDINGS: Decreased cardiomegaly from prior exam. Mediastinal contours are unchanged. There is atherosclerosis of the aortic arch. Improved pulmonary edema from prior, minimal central vascular congestion. Decreased pleural effusions from prior exam. No focal airspace disease or pneumothorax. Bones are under mineralized, no acute osseous abnormalities. IMPRESSION: No acute findings. Pulmonary edema and pleural effusions on prior exam have resolved. Electronically Signed   By:  Rubye Oaks M.D.   On: 12/28/2017 21:14   Dg Abd 1 View  Result Date: 12/29/2017 CLINICAL DATA:  Abdominal pain. EXAM: ABDOMEN - 1 VIEW COMPARISON:  12/09/2017. FINDINGS: Soft tissue structures are unremarkable. Air-filled loops of small and large bowel are noted. These findings suggest mild adynamic ileus. No acute bony abnormality identified. Degenerative changes thoracolumbar spine and both hips. Pelvic calcifications consistent phleboliths. IMPRESSION: Air-filled loops of small large bowel are noted suggesting adynamic ileus. No prominent bowel distention. No free air. Electronically Signed   By: Maisie Fus  Register   On: 12/29/2017 09:16   Ct Head Wo Contrast  Result Date: 12/28/2017 CLINICAL DATA:  Fall with laceration EXAM: CT HEAD WITHOUT CONTRAST TECHNIQUE: Contiguous axial images were obtained from the base of the skull through the vertex without intravenous contrast. COMPARISON:  None. FINDINGS: Brain: No evidence of acute infarction, hemorrhage, hydrocephalus, extra-axial collection or mass lesion/mass effect. Mild atrophy Vascular: No hyperdense vessel.  Carotid vascular calcification Skull: Normal. Negative for fracture or focal lesion. Sinuses/Orbits: No acute finding. Other: None IMPRESSION: Negative non contrasted CT appearance of the brain for age Electronically Signed   By: Jasmine Pang M.D.   On: 12/28/2017 20:56     Medications:   . potassium chloride 10 mEq (12/30/17 0935)   . aspirin EC  81 mg Oral Daily  . carvedilol  3.125 mg Oral BID WC  . docusate sodium  100 mg Oral BID  . feeding supplement (ENSURE ENLIVE)  237 mL Oral BID BM  . fluticasone furoate-vilanterol  1 puff Inhalation Daily  . heparin  5,000 Units Subcutaneous Q8H  . multivitamin with minerals  1 tablet Oral Daily  . predniSONE  40 mg Oral Q breakfast  . tiotropium  18 mcg Inhalation Daily   acetaminophen **OR** acetaminophen, albuterol, ALPRAZolam, bisacodyl, hydrALAZINE, LORazepam, ondansetron  **OR** ondansetron (ZOFRAN) IV, traZODone  Assessment/ Plan:  77 y.o. female with Asthma, COPD, HTN was admitted on 12/28/2017 with Acute hyponatremia.    1.  Acute hyponatremia and hypokalemia Patient had normal sodium level of 136 on May  10  Hyponatremia seems to be secondary to overdiuresis in the setting of poor p.o. Intake   Na improved to 123 with 3% saline for initial fast correction and then slow correction  D5 was used for few hours to slow the correction  Sodium level was 119 this morning Goal of correction by tomorrow morning is 127 Starting to eat some.  Will place on low dose NS with Kcl. Continue to monitor Na frequently  2.  Hypomagnesemia Expected to correct with normal diet     LOS: 2 Noele Icenhour 5/24/201910:34 AM  [facility] [place], [place] [phone]  Note: This note was prepared with Dragon dictation. Any transcription errors are unintentional

## 2017-12-30 NOTE — Progress Notes (Signed)
[facility] Cardiology  SUBJECTIVE: Patient laying in bed without specific complaints   Vitals:   12/30/17 0500 12/30/17 0600 12/30/17 0700 12/30/17 0800  BP: 130/69 132/64 131/63 (!) 138/59  Pulse: 90 91 89 90  Resp: (!) 23  Temp:    98.6 F (37 C)  TempSrc:    Axillary  SpO2: 100% 100% 100% 100%  Weight: 72.6 kg (160 lb 0.9 oz)     Height:         Intake/Output Summary (Last 24 hours) at 12/30/2017 0845 Last data filed at 12/30/2017 0800 Gross per 24 hour  Intake 1150 ml  Output 860 ml  Net 290 ml      PHYSICAL EXAM  General: Well developed, well nourished, in no acute distress HEENT:  Normocephalic and atramatic Neck:  No JVD.  Lungs: Clear bilaterally to auscultation and percussion. Heart: HRRR . Normal S1 and S2 without gallops or murmurs.  Abdomen: Bowel sounds are positive, abdomen soft and non-tender  Msk:  Back normal, normal gait. Normal strength and tone for age. Extremities: No clubbing, cyanosis or edema.   Neuro: Alert and oriented X 3. Psych:  Good affect, responds appropriately   LABS: Basic Metabolic Panel: Recent Labs    12/29/17 0609  12/30/17 0038 12/30/17 0511  NA 108*   < > 123* 121*  K 3.4*  --   --  3.0*  CL <65*  --   --  74*  CO2 35*  --   --  40*  GLUCOSE 86  --   --  112*  BUN 15  --   --  8  CREATININE 0.39*  --   --  0.53  CALCIUM 8.3*  --   --  8.9  MG 1.1*  --   --  2.5*  PHOS 2.0*  --   --  3.0   < > = values in this interval not displayed.   Liver Function Tests: Recent Labs    12/28/17 1916  AST 50*  ALT 47  ALKPHOS 61  BILITOT 1.3*  PROT 7.2  ALBUMIN 4.1   No results for input(s): LIPASE, AMYLASE in the last 72 hours. CBC: Recent Labs    12/29/17 0609 12/30/17 0511  WBC 12.3* 9.4  HGB 12.0 12.4  HCT 33.6* 35.4  MCV 86.9 90.1  PLT 312 347   Cardiac Enzymes: Recent Labs    12/28/17 2023  TROPONINI <0.03   BNP: Invalid input(s): POCBNP D-Dimer: No results for input(s): DDIMER in the last 72  hours. Hemoglobin A1C: No results for input(s): HGBA1C in the last 72 hours. Fasting Lipid Panel: No results for input(s): CHOL, HDL, LDLCALC, TRIG, CHOLHDL, LDLDIRECT in the last 72 hours. Thyroid Function Tests: No results for input(s): TSH, T4TOTAL, T3FREE, THYROIDAB in the last 72 hours.  Invalid input(s): FREET3 Anemia Panel: No results for input(s): VITAMINB12, FOLATE, FERRITIN, TIBC, IRON, RETICCTPCT in the last 72 hours.  Dg Chest 2 View  Result Date: 12/28/2017 CLINICAL DATA:  Altered mental status and confusion. Fall out of wheelchair. EXAM: CHEST - 2 VIEW COMPARISON:  Radiograph 12/13/2017, additional priors FINDINGS: Decreased cardiomegaly from prior exam. Mediastinal contours are unchanged. There is atherosclerosis of the aortic arch. Improved pulmonary edema from prior, minimal central vascular congestion. Decreased pleural effusions from prior exam. No focal airspace disease or pneumothorax. Bones are under mineralized, no acute osseous abnormalities. IMPRESSION: No acute findings. Pulmonary edema and pleural effusions on prior exam have resolved. Electronically Signed  By: Rubye Oaks M.D.   On: 12/28/2017 21:14   Dg Abd 1 View  Result Date: 12/29/2017 CLINICAL DATA:  Abdominal pain. EXAM: ABDOMEN - 1 VIEW COMPARISON:  12/09/2017. FINDINGS: Soft tissue structures are unremarkable. Air-filled loops of small and large bowel are noted. These findings suggest mild adynamic ileus. No acute bony abnormality identified. Degenerative changes thoracolumbar spine and both hips. Pelvic calcifications consistent phleboliths. IMPRESSION: Air-filled loops of small large bowel are noted suggesting adynamic ileus. No prominent bowel distention. No free air. Electronically Signed   By: Maisie Fus  Register   On: 12/29/2017 09:16   Ct Head Wo Contrast  Result Date: 12/28/2017 CLINICAL DATA:  Fall with laceration EXAM: CT HEAD WITHOUT CONTRAST TECHNIQUE: Contiguous axial images were obtained  from the base of the skull through the vertex without intravenous contrast. COMPARISON:  None. FINDINGS: Brain: No evidence of acute infarction, hemorrhage, hydrocephalus, extra-axial collection or mass lesion/mass effect. Mild atrophy Vascular: No hyperdense vessel.  Carotid vascular calcification Skull: Normal. Negative for fracture or focal lesion. Sinuses/Orbits: No acute finding. Other: None IMPRESSION: Negative non contrasted CT appearance of the brain for age Electronically Signed   By: Jasmine Pang M.D.   On: 12/28/2017 20:56     Echo   TELEMETRY: Normal sinus rhythm:  ASSESSMENT AND PLAN:  Active Problems:   Acute hyponatremia    1.  Hyponatremia, volume depletion, improved after hypertonic saline 2.  Essential hypertension, blood pressure in normal range  Recommendations  1.  Agree with current therapy 2.  Continue sodium repletion 3.  Continue IV fluid resuscitation 4.  Hold diuretics 5.  Resume carvedilol 6.  Defer further cardiac diagnostics  Sign off for now, please call if any questions   Marcina Millard, MD, PhD, [facility] 12/30/2017 8:45 AM

## 2017-12-30 NOTE — Progress Notes (Signed)
Pharmacy Electrolyte Monitoring Consult:  Pharmacy consulted to assist in monitoring and replacing electrolytes in this 77 y.o. female admitted on 12/28/2017 with hyponatremia secondary to over diuresis.   Patient currently ordered NS/Potassium 34mEq/Butler at 47mL/hr. Patient received potassium phosphate IV x 1.   Labs:  Sodium (mmol/Butler)  Date Value  12/30/2017 119 (LL)   Potassium (mmol/Butler)  Date Value  12/30/2017 3.0 (Butler)   Magnesium (mg/dL)  Date Value  78/29/5621 2.5 (H)   Phosphorus (mg/dL)  Date Value  30/86/5784 3.0   Calcium (mg/dL)  Date Value  69/62/9528 8.9   Albumin (g/dL)  Date Value  41/32/4401 4.1    Assessment/Plan: Will obtain follow up electrolytes with am labs.   Pharmacy will continue to monitor and adjust per consult.  Candace Butler 12/30/2017 3:43 PM

## 2017-12-30 NOTE — Progress Notes (Signed)
[employer] [facility] [place] [facility]   PATIENT NAME: Candace Butler    MR#:  [id number]  DATE OF BIRTH:  03/09/1941  SUBJECTIVE:  CHIEF COMPLAINT:   Chief Complaint  Patient presents with  . Fall   Came with weakness, was here 2 weeks ago, Was discharged on oral Lasix and hydrochlorothiazide, came with generalized weakness and noted to have severe hyponatremia.  REVIEW OF SYSTEMS:  CONSTITUTIONAL: No fever, severe fatigue or weakness.  EYES: No blurred or double vision.  EARS, NOSE, AND THROAT: No tinnitus or ear pain.  RESPIRATORY: No cough, shortness of breath, wheezing or hemoptysis.  CARDIOVASCULAR: No chest pain, orthopnea, edema.  GASTROINTESTINAL: No nausea, vomiting, diarrhea or abdominal pain.  GENITOURINARY: No dysuria, hematuria.  ENDOCRINE: No polyuria, nocturia,  HEMATOLOGY: No anemia, easy bruising or bleeding SKIN: No rash or lesion. MUSCULOSKELETAL: No joint pain or arthritis.   NEUROLOGIC: No tingling, numbness, weakness.  PSYCHIATRY: No anxiety or depression.   ROS  DRUG ALLERGIES:   Allergies  Allergen Reactions  . Indomethacin   . Vicodin [Hydrocodone-Acetaminophen]     VITALS:  Blood pressure (!) 127/58, pulse (!) 109, temperature 98.8 F (37.1 C), temperature source Oral, resp. rate 17, height  (1.575 m), weight 72.6 kg (160 lb 0.9 oz), SpO2 100 %.  PHYSICAL EXAMINATION:  GENERAL:  77 y.o.-year-old patient lying in the bed with no acute distress.  EYES: Pupils equal, round, reactive to light and accommodation. No scleral icterus. Extraocular muscles intact.  HEENT: Head atraumatic, normocephalic. Oropharynx and nasopharynx clear.  NECK:  Supple, no jugular venous distention. No thyroid enlargement, no tenderness.  LUNGS: Normal breath sounds bilaterally, no wheezing, rales,rhonchi or crepitation. No use of accessory muscles of respiration.  CARDIOVASCULAR: S1, S2 normal. No murmurs, rubs, or gallops.  ABDOMEN: Soft, nontender,  nondistended. Bowel sounds present. No organomegaly or mass.  EXTREMITIES: No pedal edema, cyanosis, or clubbing.  NEUROLOGIC: Cranial nerves II through XII are intact. Muscle strength 3/5 in all extremities. Sensation intact. Gait not checked.  PSYCHIATRIC: The patient is alert and oriented x 3.  SKIN: No obvious rash, lesion, or ulcer.   Physical Exam LABORATORY PANEL:   CBC Recent Labs  Lab 12/30/17 0511  WBC 9.4  HGB 12.4  HCT 35.4  PLT 347   ------------------------------------------------------------------------------------------------------------------  Chemistries  Recent Labs  Lab 12/28/17 1916  12/30/17 0511  12/30/17 1349  NA 102*   < > 121*   < > 119*  K 3.3*   < > 3.0*  --   --   CL <65*   < > 74*  --   --   CO2 39*   < > 40*  --   --   GLUCOSE 108*   < > 112*  --   --   BUN 20   < > 8  --   --   CREATININE 0.50   < > 0.53  --   --   CALCIUM 8.8*   < > 8.9  --   --   MG  --    < > 2.5*  --   --   AST 50*  --   --   --   --   ALT 47  --   --   --   --   ALKPHOS 61  --   --   --   --   BILITOT 1.3*  --   --   --   --    < > =  values in this interval not displayed.   ------------------------------------------------------------------------------------------------------------------  Cardiac Enzymes Recent Labs  Lab 12/28/17 2023  TROPONINI <0.03   ------------------------------------------------------------------------------------------------------------------  RADIOLOGY:  Dg Chest 2 View  Result Date: 12/28/2017 CLINICAL DATA:  Altered mental status and confusion. Fall out of wheelchair. EXAM: CHEST - 2 VIEW COMPARISON:  Radiograph 12/13/2017, additional priors FINDINGS: Decreased cardiomegaly from prior exam. Mediastinal contours are unchanged. There is atherosclerosis of the aortic arch. Improved pulmonary edema from prior, minimal central vascular congestion. Decreased pleural effusions from prior exam. No focal airspace disease or pneumothorax. Bones  are under mineralized, no acute osseous abnormalities. IMPRESSION: No acute findings. Pulmonary edema and pleural effusions on prior exam have resolved. Electronically Signed   By: Rubye Oaks M.D.   On: 12/28/2017 21:14   Dg Abd 1 View  Result Date: 12/29/2017 CLINICAL DATA:  Abdominal pain. EXAM: ABDOMEN - 1 VIEW COMPARISON:  12/09/2017. FINDINGS: Soft tissue structures are unremarkable. Air-filled loops of small and large bowel are noted. These findings suggest mild adynamic ileus. No acute bony abnormality identified. Degenerative changes thoracolumbar spine and both hips. Pelvic calcifications consistent phleboliths. IMPRESSION: Air-filled loops of small large bowel are noted suggesting adynamic ileus. No prominent bowel distention. No free air. Electronically Signed   By: Maisie Fus  Register   On: 12/29/2017 09:16   Ct Head Wo Contrast  Result Date: 12/28/2017 CLINICAL DATA:  Fall with laceration EXAM: CT HEAD WITHOUT CONTRAST TECHNIQUE: Contiguous axial images were obtained from the base of the skull through the vertex without intravenous contrast. COMPARISON:  None. FINDINGS: Brain: No evidence of acute infarction, hemorrhage, hydrocephalus, extra-axial collection or mass lesion/mass effect. Mild atrophy Vascular: No hyperdense vessel.  Carotid vascular calcification Skull: Normal. Negative for fracture or focal lesion. Sinuses/Orbits: No acute finding. Other: None IMPRESSION: Negative non contrasted CT appearance of the brain for age Electronically Signed   By: Jasmine Pang M.D.   On: 12/28/2017 20:56    ASSESSMENT AND PLAN:   Active Problems:   Acute hyponatremia  1.  Acute severe hyponatremia,   hypertonic saline.  Patient looks clinically volume depleted.   monitor intensive care unit for close observation and management.    check sodium level frequently.  We will hold hydrochlorothiazide and Lasix on discharge.  Appreciated help by nephrology. 2.  Acute metabolic encephalopathy,  likely related to hyponatremia.  We will continue to monitor clinically closely, while treating the underlying disorder. More alert and oriented now. 3.  Left lower extremity laceration, status post repair in the emergency room. 4.  COPD, stable.  Continue maintenance medications. 5.  Hypokalemia, will replace K per protocol.  6. Generalized weakness- need PT eval.  All the records are reviewed and case discussed with Care Management/Social Workerr. Management plans discussed with the patient, family and they are in agreement.  CODE STATUS: Full.  TOTAL TIME TAKING CARE OF THIS PATIENT: 35 minutes.     POSSIBLE D/C IN 1-2 DAYS, DEPENDING ON CLINICAL CONDITION.   Altamese Dilling M.D on 12/30/2017   Between 7am to 6pm - Pager - [phone]  After 6pm go to [email or web address] - password [email or web address]  [facility] [place] [facility]  Office  [phone]  CC: Primary care physician; Leotis Shames, MD  Note: This dictation was prepared with Dragon dictation along with smaller phrase technology. Any transcriptional errors that result from this process are unintentional.

## 2017-12-31 DIAGNOSIS — G9341 Metabolic encephalopathy: Secondary | ICD-10-CM

## 2017-12-31 LAB — BASIC METABOLIC PANEL
ANION GAP: 7 (ref 5–15)
BUN: 14 mg/dL (ref 6–20)
CALCIUM: 8.8 mg/dL — AB (ref 8.9–10.3)
CO2: 38 mmol/L — ABNORMAL HIGH (ref 22–32)
Chloride: 79 mmol/L — ABNORMAL LOW (ref 101–111)
Creatinine, Ser: 0.5 mg/dL (ref 0.44–1.00)
Glucose, Bld: 79 mg/dL (ref 65–99)
Potassium: 4.9 mmol/L (ref 3.5–5.1)
Sodium: 124 mmol/L — ABNORMAL LOW (ref 135–145)

## 2017-12-31 LAB — SODIUM
SODIUM: 124 mmol/L — AB (ref 135–145)
SODIUM: 124 mmol/L — AB (ref 135–145)
Sodium: 123 mmol/L — ABNORMAL LOW (ref 135–145)
Sodium: 125 mmol/L — ABNORMAL LOW (ref 135–145)

## 2017-12-31 LAB — MAGNESIUM: MAGNESIUM: 2 mg/dL (ref 1.7–2.4)

## 2017-12-31 LAB — PHOSPHORUS: PHOSPHORUS: 2.1 mg/dL — AB (ref 2.5–4.6)

## 2017-12-31 LAB — GLUCOSE, CAPILLARY
GLUCOSE-CAPILLARY: 73 mg/dL (ref 65–99)
Glucose-Capillary: 91 mg/dL (ref 65–99)
Glucose-Capillary: 162 mg/dL — ABNORMAL HIGH (ref 65–99)

## 2017-12-31 LAB — CORTISOL-AM, BLOOD: Cortisol - AM: 5.5 ug/dL — ABNORMAL LOW (ref 6.7–22.6)

## 2017-12-31 MED ORDER — SODIUM CHLORIDE 0.9 % IV SOLN
INTRAVENOUS | Status: DC
  Administered 2017-12-31: 18:00:00 via INTRAVENOUS

## 2017-12-31 MED ORDER — K PHOS MONO-SOD PHOS DI & MONO 155-852-130 MG PO TABS
250.0000 mg | ORAL_TABLET | Freq: Three times a day (TID) | ORAL | Status: AC
  Administered 2017-12-31 (×2): 250 mg via ORAL
  Filled 2017-12-31 (×3): qty 1

## 2017-12-31 NOTE — Progress Notes (Signed)
Pharmacy Electrolyte Monitoring Consult:  Pharmacy consulted to assist in monitoring and replacing electrolytes in this 77 y.o. female admitted on 12/28/2017 with hyponatremia secondary to over diuresis.   Patient currently ordered NS/Potassium 52mEq/L at 75mL/hr.   Labs:  Sodium (mmol/L)  Date Value  12/31/2017 124 (L)   Potassium (mmol/L)  Date Value  12/31/2017 4.9   Magnesium (mg/dL)  Date Value  45/40/9811 2.0   Phosphorus (mg/dL)  Date Value  91/47/8295 2.1 (L)   Calcium (mg/dL)  Date Value  62/13/0865 8.8 (L)   Albumin (g/dL)  Date Value  78/46/9629 4.1    Assessment/Plan: Ordered Phospa 250 Neutral tablet PO TID x 3 doses.  Will obtain follow up electrolytes with am labs.   Pharmacy will continue to monitor and adjust per consult.  Stormy Card, [facility] 12/31/2017 9:51 AM

## 2017-12-31 NOTE — Progress Notes (Signed)
Bladder scanned pt's bladder as she had not yet voided today. of urine noted in bladder. Dr. Thedore Mins and Dr. Peggye Pitt notified. Pt states she feels urge to urinate, but having difficulty initiating stream. Will let her have a few more minutes to try to void per her request. If unable to void will in and out catheterize and then perform additional bladder scan this afternoon per Dr. Thedore Mins.

## 2017-12-31 NOTE — Progress Notes (Signed)
Speech Therapy Note: reviewed chart notes; consulted NSG then met w/ pt who was sitting up in a chair post PT session. Pt appeared weak and fatigued but verbalized she was "trying" to eat bites/sips. Pt verbalized she wanted a baked potato and potato soup at lunch today which was ordered for her. NSG reports no overt s/s of aspiration w/ po's.  ST services will continue to f/u w/ pt's status while admitted for any further education or need to modify diet consistency. Noted Palliative Care consult note.    Orinda Kenner, [place], CCC-SLP

## 2017-12-31 NOTE — Progress Notes (Addendum)
Pt's daughter is at bedside visiting with pt. She states that she is the pt's HCPOA and will bring paperwork to be added to pt chart. During morning medication administration, pt's daughter voices concerns about administration of coreg. She states that Dr. Lady Gary discontinued pt's coreg at the pt's last appointment prior to admission. Dr. Darrold Junker had restarted the coreg per progress notes. Dr. Darrold Junker seen while he was rounding on the unit and I discussed the pt's daughter's concerns. He stated that it would be okay to hold the coreg until pt is able to f/u with Dr. Lady Gary outpatient. Daughter notified of conversation with Dr. Darrold Junker and would like for the medication to be held at this time. Medication held, documented in [facility].

## 2017-12-31 NOTE — Evaluation (Signed)
Physical Therapy Evaluation Patient Details Name: Keyonna Abella MRN: [id number] DOB: [birth year] Today's Date: 12/31/2017   History of Kyarah Enamoradollness  Areonna Bran is a 77yo white female who comes to [facility] after a few days weakness, AMS, hallucination, and eventually a fall from [facility] sustaining 2 skin injuries to LLE. Pt found to have severe HypoNa+. Pt comes in from [facility] where she had been in STR working with PT. Daughter reports the patient's progress was slow, stil not safe for return to home yet.   Clinical Impression  Pt admitted with above diagnosis. Pt currently with functional limitations due to the deficits listed below (see "PT Problem List"). Upon entry, patient received bed, daughter present. The pt is awake, minimally interactive, does not make eye contact, heavy encouragement for short verbal responses that are soft spoke. Pt has malaise, denies pain, but does produce a smile 3 times in session during conversation. SpO2 stable on 3L/min, but pt c/o intermittent brethlessness. Functional mobility assessment demonstrates need for max to total-assist physical assistance for bed mobility and transfers, unable to AMB d/t weakness, whereas the patient performed these at a higher level of independence PTA. Pt will benefit from skilled PT intervention to increase independence and safety with basic mobility in preparation for discharge to the venue listed below.       Follow Up Recommendations SNF    Equipment Recommendations  None recommended by PT    Recommendations for Other Services       Precautions / Restrictions Precautions Precautions: Fall Restrictions Weight Bearing Restrictions: No      Mobility  Bed Mobility Overal bed mobility: Needs Assistance Bed Mobility: Supine to Sit     Supine to sit: Max assist     General bed mobility comments: able to move legs, but cannot pull self up, so maxA given to trunk, and for pivot to EOB.   Transfers Overall transfer  level: Needs assistance Equipment used: None(dependent transfer) Transfers: Sit to/from [employer] Sit to Stand: Mod assist Stand pivot transfers: Total assist;+2 physical assistance(unable to mobilize once up. )       General transfer comment: pt grabs onto PT elbows  Ambulation/Gait Ambulation/Gait assistance: (unable at this time due to weakness)              Stairs            Wheelchair Mobility    Modified Rankin (Stroke Patients Only)       Balance   Sitting-balance support: Single extremity supported Sitting balance-Leahy Scale: Poor       Standing balance-Leahy Scale: Zero                               Pertinent Vitals/Pain Pain Assessment: No/denies pain    Home Living Family/patient expects to be discharged to:: Skilled nursing facility(has been at [facility] for last couple weeks for rehab. ) Living Arrangements: Alone Available Help at Discharge: Family Type of Home: House Home Access: Stairs to enter Entrance Stairs-Rails: Left Entrance Stairs-Number of Steps: 4 Home Layout: One level Home Equipment: Emergency planning/management officer - 4 wheels;Cane - single point      Prior Function Level of Independence: Needs assistance   Gait / Transfers Assistance Needed: Pt ambulates up to ~10 ft with rollator or SPC.  No falls in the past 6 months.  Mostly sedentary   ADL's / Homemaking Assistance Needed: Daughter assists with tub transfer and with  bathing once pt sitting on shower seat.  Daughter does the grocery shopping, cooking, cleaning, driving. Pt independent with dressing.         Hand Dominance        Extremity/Trunk Assessment   Upper Extremity Assessment Upper Extremity Assessment: Generalized weakness(quite weak, elbows 4/5, but scapular instability; chronic shoulder limitations ROM restrictions. )    Lower Extremity Assessment Lower Extremity Assessment: Generalized weakness(4/5 grossly, but unable to  pick up feet in stance. )       Communication   Communication: No difficulties  Cognition Arousal/Alertness: Awake/alert Behavior During Therapy: WFL for tasks assessed/performed Overall Cognitive Status: Within Functional Limits for tasks assessed                                        General Comments      Exercises Other Exercises Other Exercises: heel slides x5 bilat Other Exercises: minisquat leg press, manually resisted x5 bilat    Assessment/Plan    PT Assessment Patient needs continued PT services  PT Problem List Decreased strength;Decreased activity tolerance;Decreased mobility;Decreased balance;Decreased knowledge of use of DME;Decreased safety awareness;Cardiopulmonary status limiting activity;Decreased knowledge of precautions;Decreased range of motion       PT Treatment Interventions DME instruction;Gait training;Stair training;Functional mobility training;Therapeutic activities;Therapeutic exercise;Balance training;Neuromuscular re-education;Patient/family education    PT Goals (Current goals can be found in the Care Plan section)  Acute Rehab PT Goals Patient Stated Goal: improve AMB tolerance, independence with basic mobility.  PT Goal Formulation: With family Time For Goal Achievement: 12/30/17 Potential to Achieve Goals: Fair    Frequency Min 2X/week   Barriers to discharge        Co-evaluation               AM-PAC PT "6 Clicks" Daily Activity  Outcome Measure Difficulty turning over in bed (including adjusting bedclothes, sheets and blankets)?: Unable Difficulty moving from lying on back to sitting on the side of the bed? : Unable Difficulty sitting down on and standing up from a chair with arms (e.g., wheelchair, bedside commode, etc,.)?: Unable Help needed moving to and from a bed to chair (including a wheelchair)?: Total Help needed walking in hospital room?: Total Help needed climbing 3-5 steps with a railing? : Total 6  Click Score: 6    End of Session Equipment Utilized During Treatment: Oxygen Activity Tolerance: Patient limited by fatigue;Patient limited by lethargy Patient left: in chair;with call bell/phone within reach;with family/visitor present;with SCD's reapplied Nurse Communication: Mobility status PT Visit Diagnosis: Unsteadiness on feet (R26.81);Muscle weakness (generalized) (M62.81);Difficulty in walking, not elsewhere classified (R26.2)    Time: 4098-1191 PT Time Calculation (min) (ACUTE ONLY): 30 min   Charges:   PT Evaluation $PT Eval High Complexity: 1 High PT Treatments $Therapeutic Activity: 8-22 mins   PT G Codes:        9:36 AM, January 29, 2018 Rosamaria Lints, PT, DPT Physical Therapist - [facility]  [phone] (ASCOM)    Sarafina Puthoff C 29-Jan-2018, 9:33 AM

## 2017-12-31 NOTE — Progress Notes (Signed)
Pt complaining of worsening vision from last night to this morning. Neuro exam remains consistent with previous exam, and is documented in flow sheet. Pt is able to identify number of fingers examiner holds up, she is able to read large print on her bedside menu accurately, and she is unable to read the smaller print on bedside menu. Pt's daughter is at bedside and states that the patient has had visual complaints for nearly 2 weeks. Daughter also states that the pt has complained of intermittent "burning" of bilateral eyes. Conjunctiva and sclera appear WNL and PERRLA. Dr. Peggye Pitt notified and she performed bedside exam. Per Dr. Peggye Pitt pt may have PRN eye drops for dry eye, and she will consider referral to opthalmologist. In addition, pt continues to have Q1 neuro checks ordered in Epic. Per Dr. Peggye Pitt neuro checks may be decreased to Q4. Orders updated in Epic.

## 2017-12-31 NOTE — Progress Notes (Signed)
[facility] - [place] at [facility]   PATIENT NAME: Candace Butler    MR#:  [id number]  DATE OF BIRTH:  02-Apr-1941  SUBJECTIVE:  CHIEF COMPLAINT:   Chief Complaint  Patient presents with  . Fall   Came with weakness, was here 2 weeks ago, Was discharged on oral Lasix and hydrochlorothiazide, came with generalized weakness and noted to have severe hyponatremia.  REVIEW OF SYSTEMS:  CONSTITUTIONAL: No fever, severe fatigue or weakness.  EYES: No blurred or double vision.  EARS, NOSE, AND THROAT: No tinnitus or ear pain.  RESPIRATORY: No cough, shortness of breath, wheezing or hemoptysis.  CARDIOVASCULAR: No chest pain, orthopnea, edema.  GASTROINTESTINAL: No nausea, vomiting, diarrhea or abdominal pain.  GENITOURINARY: No dysuria, hematuria.  ENDOCRINE: No polyuria, nocturia,  HEMATOLOGY: No anemia, easy bruising or bleeding SKIN: No rash or lesion. MUSCULOSKELETAL: No joint pain or arthritis.   NEUROLOGIC: No tingling, numbness, weakness.  PSYCHIATRY: No anxiety or depression.   ROS  DRUG ALLERGIES:   Allergies  Allergen Reactions  . Indomethacin   . Vicodin [Hydrocodone-Acetaminophen]     VITALS:  Blood pressure (!) 145/56, pulse 83, temperature 98.3 F (36.8 C), temperature source Oral, resp. rate (!) 21, height  (1.575 m), weight 67.3 kg (148 lb 5.9 oz), SpO2 98 %.  PHYSICAL EXAMINATION:  GENERAL:  77 y.o.-year-old patient lying in the bed with no acute distress.  EYES: Pupils equal, round, reactive to light and accommodation. No scleral icterus. Extraocular muscles intact.  HEENT: Head atraumatic, normocephalic. Oropharynx and nasopharynx clear.  NECK:  Supple, no jugular venous distention. No thyroid enlargement, no tenderness.  LUNGS: Normal breath sounds bilaterally, no wheezing, rales,rhonchi or crepitation. No use of accessory muscles of respiration.  CARDIOVASCULAR: S1, S2 normal. No murmurs, rubs, or gallops.  ABDOMEN: Soft, nontender,  nondistended. Bowel sounds present. No organomegaly or mass.  EXTREMITIES: No pedal edema, cyanosis, or clubbing.  NEUROLOGIC: Cranial nerves II through XII are intact. Muscle strength 3/5 in all extremities. Sensation intact. Gait not checked.  PSYCHIATRIC: The patient is alert and oriented x 3.  SKIN: No obvious rash, lesion, or ulcer.   Physical Exam LABORATORY PANEL:   CBC Recent Labs  Lab 12/30/17 0511  WBC 9.4  HGB 12.4  HCT 35.4  PLT 347   ------------------------------------------------------------------------------------------------------------------  Chemistries  Recent Labs  Lab 12/28/17 1916  12/31/17 0441  12/31/17 1201  NA 102*   < > 124*   < > 123*  K 3.3*   < > 4.9  --   --   CL <65*   < > 79*  --   --   CO2 39*   < > 38*  --   --   GLUCOSE 108*   < > 79  --   --   BUN 20   < > 14  --   --   CREATININE 0.50   < > 0.50  --   --   CALCIUM 8.8*   < > 8.8*  --   --   MG  --    < > 2.0  --   --   AST 50*  --   --   --   --   ALT 47  --   --   --   --   ALKPHOS 61  --   --   --   --   BILITOT 1.3*  --   --   --   --    < > =  values in this interval not displayed.   ------------------------------------------------------------------------------------------------------------------  Cardiac Enzymes Recent Labs  Lab 12/28/17 2023  TROPONINI <0.03   ------------------------------------------------------------------------------------------------------------------  RADIOLOGY:  No results found.  ASSESSMENT AND PLAN:   Active Problems:   Acute hyponatremia  1.  Acute severe hyponatremia,   hypertonic saline.  Patient looks clinically volume depleted.   monitor intensive care unit for close observation and management.    check sodium level frequently.  We will hold hydrochlorothiazide and Lasix on discharge.  Appreciated help by nephrology. Continue IV fluid. 2.  Acute metabolic encephalopathy, likely related to hyponatremia.     continue to monitor  clinically closely, while treating the underlying disorder. More alert and oriented now. 3.  Left lower extremity laceration, status post repair in the emergency room. 4.  COPD, stable.  Continue maintenance medications. 5.  Hypokalemia, will replace K per protocol.   As now potassium is rising, we will stop IV replacement. 6. Generalized weakness- need PT eval.  All the records are reviewed and case discussed with Care Management/Social Workerr. Management plans discussed with the patient, family and they are in agreement.  CODE STATUS: Full.  TOTAL TIME TAKING CARE OF THIS PATIENT: 35 minutes.    POSSIBLE D/C IN 1-2 DAYS, DEPENDING ON CLINICAL CONDITION.   Altamese Dilling M.D on 12/31/2017   Between 7am to 6pm - Pager - [phone]  After 6pm go to [email or web address] - password [email or web address]  [facility] [place] [facility]  Office  [phone]  CC: Primary care physician; Leotis Shames, MD  Note: This dictation was prepared with Dragon dictation along with smaller phrase technology. Any transcriptional errors that result from this process are unintentional.

## 2017-12-31 NOTE — Progress Notes (Signed)
Name: Candace Butler MRN: [id number] DOB: March 20, 1941    ADMISSION DATE:  12/28/2017 CONSULTATION DATE: 12/28/2017  REFERRING MD : Dr. Caryn Bee   CHIEF COMPLAINT: Fall   BRIEF PATIENT DESCRIPTION:  77 yo female from [facility] admitted following a fall found to have severe hypovolemic hyponatremia and hypochloridemia likely medication induced requiring hypertonic saline 3% gtt  SIGNIFICANT EVENTS/STUDIES:  05/22 Pt admitted to ICU  05/22 CT Head negative non contrasted CT appearance of the brain for age   HISTORY OF PRESENT ILLNESS:   This is a 77 yo female with a PMH of HTN, COPD, Chronic O2 , and Asthma.  She presented to [facility] ER on 05/22 via EMS from [facility] after falling out of her wheelchair, and the wheelchair fell on top of her head resulting in a left shin laceration and abrasion to her left knee. Per ER notes upon arrival to the ER the pt was alert and oriented x4, however her eyes were frequently rolling in the back of her head.  CT Head, blood alcohol level, and CXR negative.  Lab results revealed Na+ 102, K+ 3.3, chloride <65, CO2 39, lactic acid 1.1, and UA negative.  During previous hospitalization on 12/17/2017 her serum sodium was 132.  She was discharged from [facility] on 12/19/17 and  instructed to continue enalapril-hydrochlorothiazide and started on 60 mg furosemide daily.  During current hospitalization nephrology consulted plans to start hypertonic saline 3% gtt.  She was subsequently admitted to ICU for further workup and treatment.   REVIEW OF SYSTEMS:  Constitutional: Negative for fever, chills, weight loss, malaise/fatigue and diaphoresis.  HENT: Negative for hearing loss, ear pain, nosebleeds, congestion, sore throat, neck pain, tinnitus and ear discharge.   Eyes: Negative for blurred vision, double vision, photophobia, pain, discharge and redness.  Respiratory: Negative for cough, hemoptysis, sputum production, shortness of breath, wheezing and stridor.     Cardiovascular: Negative for chest pain, palpitations, orthopnea, claudication, leg swelling and PND.  Gastrointestinal: Negative for heartburn, nausea, vomiting, abdominal pain, diarrhea, constipation, blood in stool and melena.  Genitourinary: Negative for dysuria, urgency, frequency, hematuria and flank pain.  Musculoskeletal: Negative for myalgias, back pain, joint pain and falls.  Skin: Negative for itching and rash.  Neurological: Awake, alert and appropriate,compared of visual impairment   Endo/Heme/Allergies: Negative for environmental allergies and polydipsia. Does not bruise/bleed easily.  SUBJECTIVE:  Pt states she is confused   VITAL SIGNS: Temp:  [96.6 F (35.9 C)-99.2 F (37.3 C)] 98.3 F (36.8 C) (05/25 1600) Pulse Rate:  [67-93] 83 (05/25 1600) Resp:  [13-26] 21 (05/25 1600) BP: (101-155)/(45-100) 145/56 (05/25 1600) SpO2:  [94 %-100 %] 98 % (05/25 1600) Weight:  [148 lb 5.9 oz (67.3 kg)] 148 lb 5.9 oz (67.3 kg) (05/25 0500)  PHYSICAL EXAMINATION: General: comfortable  Neuro: AAO x 3, no obvious field defect, power 4/5 upper and lower extremities HEENT: supple, no JVD  Cardiovascular: sinus rhythm  Lungs: CTA, no rales or rhonchi Abdomen: +BS x4, obese, soft, mild tenderness, non distended  Musculoskeletal: normal bulk and tone, no edema  Skin: laceration to left shin and abrasion stitches in place   Recent Labs  Lab 12/29/17 0609  12/30/17 0511  12/31/17 0441 12/31/17 0829 12/31/17 1201  NA 108*   < > 121*   < > 124* 124* 123*  K 3.4*  --  3.0*  --  4.9  --   --   CL <65*  --  74*  --  79*  --   --  CO2 35*  --  40*  --  38*  --   --   BUN 15  --  8  --  14  --   --   CREATININE 0.39*  --  0.53  --  0.50  --   --   GLUCOSE 86  --  112*  --  79  --   --    < > = values in this interval not displayed.   Recent Labs  Lab 12/28/17 1916 12/29/17 0609 12/30/17 0511  HGB 13.0 12.0 12.4  HCT 35.4 33.6* 35.4  WBC 12.6* 12.3* 9.4  PLT 332 312 347    No results found.  ASSESSMENT / PLAN:  1.Acute severe hyponatremia. Improving appropriately. Plan: continue management as per Nephrologist.  Caution not to raise Na > 12 meq/24 hours, patient has been on Prednisone at home so will check cortisol level. May need to restart This.  2.Acute metabolic encephalopathy,likely related to hyponatremia.MS is much improved. Plan: PT follow up  3.Left lower extremity laceration, status post repair in the emergency room. Plan: local care  4.Hx. COPD without exacerbation. Plan: Continue maintenance medications.  5.Hypokalemia has resolved  6. Urinary retention of un clear etiology Plan: close monitor, try to avoid indwelling foley  7. Complaints of visual impairment- no deficits on visual fields test Plan: opthalmology F/U upon discharge.  If worsen will consult whilst in hospital    Jackson Latino, MD  Pulmonary/Critical Care Pager [phone] (please enter 7 digits) PCCM Consult Pager [phone] (please enter 7 digits)

## 2017-12-31 NOTE — Progress Notes (Signed)
[facility] [place], [place] 12/31/17  Subjective:   Patient is doing better.  Sodium level is now 124 Potassium is normal Serum creatinine is normal Urine output has improved to 1300 cc 5/25: This morning, Patient had difficulty voiding.  Bladder scan shows urine greater than 700.     Objective:  Vital signs in last 24 hours:  Temp:  [96.6 F (35.9 C)-99.2 F (37.3 C)] 98 F (36.7 C) (05/25 1139) Pulse Rate:  [67-109] 77 (05/25 1100) Resp:  [13-28] 18 (05/25 1100) BP: (101-155)/(45-100) 137/49 (05/25 1100) SpO2:  [94 %-100 %] 96 % (05/25 1100) Weight:  [67.3 kg (148 lb 5.9 oz)] 67.3 kg (148 lb 5.9 oz) (05/25 0500)  Weight change: -5.3 kg (-11 lb 11 oz) Filed Weights   12/29/17 0512 12/30/17 0500 12/31/17 0500  Weight: 73.4 kg (161 lb 13.1 oz) 72.6 kg (160 lb 0.9 oz) 67.3 kg (148 lb 5.9 oz)    Intake/Output:    Intake/Output Summary (Last 24 hours) at 12/31/2017 1145 Last data filed at 12/31/2017 1009 Gross per 24 hour  Intake 1557.5 ml  Output 1240 ml  Net 317.5 ml     Physical Exam: General:  No acute distress, sitting up in the recliner chair  HEENT  anicteric, dry oral mucous membranes  Neck  supple   Pulm/lungs normal breathing effort, nasal cannula oxygen, clear  CVS/Heart  regular rate and rhythm  Abdomen:   Soft, nontender  Extremities:  No peripheral edema  Neurologic:  Alert, able to follow few commands, orieinted to self  Skin: No acute rashes          Basic Metabolic Panel:  Recent Labs  Lab 12/28/17 1916 12/28/17 2009  12/29/17 0609  12/30/17 0511  12/30/17 1649 12/30/17 2046 12/31/17 0057 12/31/17 0441 12/31/17 0829  NA 102* 102*   < > 108*   < > 121*   < > 121* 120* 124* 124* 124*  K 3.3* 3.2*  --  3.4*  --  3.0*  --   --   --   --  4.9  --   CL <65* <65*  --  <65*  --  74*  --   --   --   --  79*  --   CO2 39* 39*  --  35*  --  40*  --   --   --   --  38*  --   GLUCOSE 108* 110*  --  86  --  112*  --   --   --    --  79  --   BUN 20 20  --  15  --  8  --   --   --   --  14  --   CREATININE 0.50 0.59  --  0.39*  --  0.53  --   --   --   --  0.50  --   CALCIUM 8.8* 8.8*  --  8.3*  --  8.9  --   --   --   --  8.8*  --   MG  --  1.3*  --  1.1*  --  2.5*  --   --   --   --  2.0  --   PHOS  --   --   --  2.0*  --  3.0  --   --   --   --  2.1*  --    < > = values in this  interval not displayed.     CBC: Recent Labs  Lab 12/28/17 1916 12/29/17 0609 12/30/17 0511  WBC 12.6* 12.3* 9.4  HGB 13.0 12.0 12.4  HCT 35.4 33.6* 35.4  MCV 86.5 86.9 90.1  PLT 332 312 347     No results found for: HEPBSAG, HEPBSAB, HEPBIGM    Microbiology:  Recent Results (from the past 240 hour(s))  MRSA PCR Screening     Status: None   Collection Time: 12/28/17 10:27 PM  Result Value Ref Range Status   MRSA by PCR NEGATIVE NEGATIVE Final    Comment:        The GeneXpert MRSA Assay (FDA approved for NASAL specimens only), is one component of a comprehensive MRSA colonization surveillance program. It is not intended to diagnose MRSA infection nor to guide or monitor treatment for MRSA infections. Performed at [facility], [address] [place], [place] [address]     Coagulation Studies: No results for input(s): LABPROT, INR in the last 72 hours.  Urinalysis: Recent Labs    12/28/17 2010  COLORURINE YELLOW*  LABSPEC 1.012  PHURINE 6.0  GLUCOSEU NEGATIVE  HGBUR NEGATIVE  BILIRUBINUR NEGATIVE  KETONESUR NEGATIVE  PROTEINUR NEGATIVE  NITRITE NEGATIVE  LEUKOCYTESUR NEGATIVE      Imaging: No results found.   Medications:   . 0.9 % NaCl with KCl 20 mEq / L 50 mL/hr at 12/31/17 0817   . aspirin EC  81 mg Oral Daily  . carvedilol  3.125 mg Oral BID WC  . docusate sodium  100 mg Oral BID  . feeding supplement (ENSURE ENLIVE)  237 mL Oral BID BM  . fluticasone furoate-vilanterol  1 puff Inhalation Daily  . heparin  5,000 Units Subcutaneous Q8H  . multivitamin with minerals  1  tablet Oral Daily  . phosphorus  250 mg Oral TID  . predniSONE  40 mg Oral Q breakfast  . tiotropium  18 mcg Inhalation Daily   acetaminophen **OR** acetaminophen, albuterol, ALPRAZolam, bisacodyl, hydrALAZINE, LORazepam, ondansetron **OR** ondansetron (ZOFRAN) IV, polyvinyl alcohol, traZODone  Assessment/ Plan:  77 y.o. female with Asthma, COPD, HTN was admitted on 12/28/2017 with Acute hyponatremia.    1.  Acute hyponatremia and hypokalemia Patient had normal sodium level of 136 on May 10  Hyponatremia seems to be secondary to overdiuresis in the setting of poor p.o. Intake   Initially 3% saline was used, now on NS  Sodium level is 124 this morning Goal of correction by tomorrow morning is > 130 Starting to eat some.  Able to drink Ensure Continue low dose NS with Kcl. Continue to monitor Na frequently  2.  Hypomagnesemia Expected to correct with normal diet  3.  Urinary retention May need in and out cath and monitoring with bladder scans     LOS: 3 Candace Butler 5/25/201911:45 AM  [facility] [place], [place] [phone]  Note: This note was prepared with Dragon dictation. Any transcription errors are unintentional

## 2017-12-31 NOTE — Evaluation (Signed)
Occupational Therapy Evaluation Patient Details Name: Candace Butler MRN: [id number] DOB: [birth year] Today's Date: 12/31/2017    History of Present Illness Lynzie Cliburn is a 77yo white female who comes to [facility] after a few days weakness, AMS, hallucination, and eventually a fall from [facility] sustaining 2 skin injuries to LLE. Pt found to have severe HypoNa+.    Clinical Impression   Pt. presents with weakness, limited UE ROm., 5/10 pain in the left shoulder, limited activity tolerance, and limited functional mobility which limits the ability to complete basic ADL and IADL functioning. Pt. resides at home alone. Pt. Was independent with self-dressing, and required assist with bathin, and tub transfers. Pt. required assist with IADL functioning: including meal preparation, medication management, home management, and tranportation. Pt. could benefit from OT services for ADL training, UE there. Ex, ROM, A/E training, Energy conservation, and work simplification tenchiques and pt. education about home modification, and DME. Pt. would benefitfrom SNF level of care upon discharge, as pt. Was previously at [facility] prior to hospitalization. Pt. Could benefit from follow-up OT services at discharge.    Follow Up Recommendations  SNF    Equipment Recommendations       Recommendations for Other Services       Precautions / Restrictions Precautions Precautions: Fall Restrictions Weight Bearing Restrictions: No      Mobility Bed Mobility Overal bed mobility: Needs Assistance Bed Mobility: Supine to Sit     Supine to sit: Max assist     General bed mobility comments: able to move legs, but cannot pull self up, so maxA given to trunk, and for pivot to EOB.   Transfers Overall transfer level: Needs assistance Equipment used: None(dependent transfer) Transfers: Sit to/from [employer] Sit to Stand: Mod assist Stand pivot transfers: Total assist;+2 physical assistance(unable  to mobilize once up. )       General transfer comment: pt grabs onto PT elbows    Balance   Sitting-balance support: Single extremity supported Sitting balance-Leahy Scale: Poor       Standing balance-Leahy Scale: Zero                             ADL either performed or assessed with clinical judgement   ADL Overall ADL's : Needs assistance/impaired Eating/Feeding: Maximal assistance   Grooming: Maximal assistance   Upper Body Bathing: Total assistance   Lower Body Bathing: Total assistance   Upper Body Dressing : Total assistance   Lower Body Dressing: Total assistance   Toilet Transfer: Total assistance                   Vision Patient Visual Report: No change from baseline       Perception     Praxis      Pertinent Vitals/Pain Pain Assessment: 0-10 Pain Score: 5  Pain Location: Right shoulder Pain Descriptors / Indicators: Aching Pain Intervention(s): Limited activity within patient's tolerance;Monitored during session     Hand Dominance     Extremity/Trunk Assessment Upper Extremity Assessment Upper Extremity Assessment: Generalized weakness(Bilateral shoulder limitations, Left shoulder painful with ROM attempts. bilateral elbow strength 4/5)   Lower Extremity Assessment Lower Extremity Assessment: Generalized weakness(4/5 grossly, but unable to pick up feet in stance. )       Communication Communication Communication: No difficulties   Cognition Arousal/Alertness: Awake/alert Behavior During Therapy: WFL for tasks assessed/performed Overall Cognitive Status: Within Functional Limits for tasks assessed  General Comments       Exercises   Shoulder Instructions     Home Living Family/patient expects to be discharged to:: Skilled nursing facility Living Arrangements: Alone Available Help at Discharge: Family Type of Home: House Home Access: Stairs to enter [employer] of Steps: 4 Entrance Stairs-Rails: Left Home Layout: One level     Bathroom Shower/Tub: Tub/shower unit;Curtain         Home Equipment: Emergency planning/management officer - 4 wheels;Cane - single point          Prior Functioning/Environment Level of Independence: Needs assistance  Gait / Transfers Assistance Needed: Pt ambulates up to ~10 ft with rollator or SPC.  No falls in the past 6 months.  Mostly sedentary  ADL's / Homemaking Assistance Needed: Pt.'s daughter assists with tub baths, and transfers to, and from a shower seat. Pt. was independent with self-dressing. Pt. required assist with IADL tasks including: meal preparation, medication management, home management tasks.   Comments: Pt denies any falls in past 6 months.        OT Problem List: Decreased strength;Decreased activity tolerance;Decreased knowledge of use of DME or AE;Impaired UE functional use;Pain;Decreased coordination;Decreased range of motion      OT Treatment/Interventions: Self-care/ADL training;Therapeutic activities;Therapeutic exercise;Neuromuscular education;Patient/family education;DME and/or AE instruction    OT Goals(Current goals can be found in the care plan section) Acute Rehab OT Goals Patient Stated Goal: To get better OT Goal Formulation: With patient  OT Frequency: Min 2X/week   Barriers to D/C:            Co-evaluation              AM-PAC PT "6 Clicks" Daily Activity     Outcome Measure Help from another person eating meals?: A Lot Help from another person taking care of personal grooming?: A Lot Help from another person toileting, which includes using toliet, bedpan, or urinal?: Total Help from another person bathing (including washing, rinsing, drying)?: Total Help from another person to put on and taking off regular upper body clothing?: Total Help from another person to put on and taking off regular lower body clothing?: Total 6 Click Score: 8   End of Session     Activity Tolerance: Patient tolerated treatment well Patient left: in chair  OT Visit Diagnosis: Muscle weakness (generalized) (M62.81);Unsteadiness on feet (R26.81)                Time: 8657-8469 OT Time Calculation (min): 15 min Charges:  OT General Charges $OT Visit: 1 Visit OT Evaluation $OT Eval Low Complexity: 1 Low G-Codes:     Olegario Messier, MS, OTR/L  Olegario Messier 12/31/2017, 12:38 PM

## 2018-01-01 ENCOUNTER — Inpatient Hospital Stay: Payer: Medicare HMO

## 2018-01-01 DIAGNOSIS — E2740 Unspecified adrenocortical insufficiency: Secondary | ICD-10-CM

## 2018-01-01 LAB — TSH: TSH: 1.917 u[IU]/mL (ref 0.350–4.500)

## 2018-01-01 LAB — BLOOD GAS, ARTERIAL
Acid-Base Excess: 24.3 mmol/L — ABNORMAL HIGH (ref 0.0–2.0)
Bicarbonate: 52.1 mmol/L — ABNORMAL HIGH (ref 20.0–28.0)
DELIVERY SYSTEMS: POSITIVE
Expiratory PAP: 8
FIO2: 0.28
INSPIRATORY PAP: 18
O2 Saturation: 85.3 %
PO2 ART: 48 mmHg — AB (ref 83.0–108.0)
Patient temperature: 37
pCO2 arterial: 75 mmHg (ref 32.0–48.0)
pH, Arterial: 7.45 (ref 7.350–7.450)

## 2018-01-01 LAB — BASIC METABOLIC PANEL
Anion gap: 5 (ref 5–15)
BUN: 12 mg/dL (ref 6–20)
CALCIUM: 8.6 mg/dL — AB (ref 8.9–10.3)
CO2: 44 mmol/L — AB (ref 22–32)
CREATININE: 0.41 mg/dL — AB (ref 0.44–1.00)
Chloride: 79 mmol/L — ABNORMAL LOW (ref 101–111)
GFR calc Af Amer: >60 mL/min (ref >60)
GFR calc non Af Amer: >60 mL/min (ref >60)
GLUCOSE: 81 mg/dL (ref 65–99)
Potassium: 5.4 mmol/L — ABNORMAL HIGH (ref 3.5–5.1)
Sodium: 128 mmol/L — ABNORMAL LOW (ref 135–145)

## 2018-01-01 LAB — GLUCOSE, CAPILLARY
GLUCOSE-CAPILLARY: 138 mg/dL — AB (ref 65–99)
Glucose-Capillary: 87 mg/dL (ref 65–99)
Glucose-Capillary: 77 mg/dL (ref 65–99)
Glucose-Capillary: 99 mg/dL (ref 65–99)
Glucose-Capillary: 114 mg/dL — ABNORMAL HIGH (ref 65–99)

## 2018-01-01 LAB — BLOOD GAS, VENOUS
ACID-BASE EXCESS: 26.7 mmol/L — AB (ref 0.0–2.0)
BICARBONATE: 58.2 mmol/L — AB (ref 20.0–28.0)
O2 SAT: 58 %
PATIENT TEMPERATURE: 37
PO2 VEN: 33 mmHg (ref 32.0–45.0)
pCO2, Ven: 113 mmHg (ref 44.0–60.0)
pH, Ven: 7.32 (ref 7.250–7.430)

## 2018-01-01 LAB — SODIUM
Sodium: 128 mmol/L — ABNORMAL LOW (ref 135–145)
Sodium: 128 mmol/L — ABNORMAL LOW (ref 135–145)
Sodium: 129 mmol/L — ABNORMAL LOW (ref 135–145)

## 2018-01-01 LAB — POTASSIUM: Potassium: 5 mmol/L (ref 3.5–5.1)

## 2018-01-01 LAB — MAGNESIUM: Magnesium: 1.7 mg/dL (ref 1.7–2.4)

## 2018-01-01 LAB — PHOSPHORUS: Phosphorus: 1.7 mg/dL — ABNORMAL LOW (ref 2.5–4.6)

## 2018-01-01 MED ORDER — DEXTROSE 50 % IV SOLN
25.0000 g | Freq: Once | INTRAVENOUS | Status: AC
  Administered 2018-01-01: 25 g via INTRAVENOUS

## 2018-01-01 MED ORDER — DEXTROSE 50 % IV SOLN
INTRAVENOUS | Status: AC
  Administered 2018-01-01: 25 g via INTRAVENOUS
  Filled 2018-01-01: qty 50

## 2018-01-01 MED ORDER — DEXTROSE-NACL 5-0.9 % IV SOLN
INTRAVENOUS | Status: DC
  Administered 2018-01-01 – 2018-01-06 (×5): via INTRAVENOUS

## 2018-01-01 MED ORDER — ALBUMIN HUMAN 25 % IV SOLN
50.0000 g | Freq: Once | INTRAVENOUS | Status: DC
  Filled 2018-01-01: qty 200

## 2018-01-01 MED ORDER — SODIUM GLYCEROPHOSPHATE 1 MMOLE/ML IV SOLN
20.0000 mmol | Freq: Once | INTRAVENOUS | Status: AC
  Administered 2018-01-01: 20 mmol via INTRAVENOUS
  Filled 2018-01-01: qty 20

## 2018-01-01 MED ORDER — ALPRAZOLAM 0.25 MG PO TABS
0.2500 mg | ORAL_TABLET | Freq: Two times a day (BID) | ORAL | Status: DC | PRN
  Administered 2018-01-02 – 2018-01-05 (×6): 0.25 mg via ORAL
  Filled 2018-01-01 (×7): qty 1

## 2018-01-01 MED ORDER — MAGNESIUM SULFATE 2 GM/50ML IV SOLN
2.0000 g | Freq: Once | INTRAVENOUS | Status: AC
  Administered 2018-01-01: 2 g via INTRAVENOUS
  Filled 2018-01-01: qty 50

## 2018-01-01 MED ORDER — HYDROCORTISONE NA SUCCINATE PF 100 MG IJ SOLR
100.0000 mg | Freq: Three times a day (TID) | INTRAMUSCULAR | Status: DC
  Administered 2018-01-01 – 2018-01-02 (×4): 100 mg via INTRAVENOUS
  Filled 2018-01-01 (×4): qty 2

## 2018-01-01 MED ORDER — K PHOS MONO-SOD PHOS DI & MONO 155-852-130 MG PO TABS
500.0000 mg | ORAL_TABLET | ORAL | Status: DC
  Filled 2018-01-01 (×3): qty 2

## 2018-01-01 NOTE — Clinical Social Work Note (Signed)
Clinical Social Work Assessment  Patient Details  Name: Candace Butler MRN: [id number] Date of Birth: 06/24/1941  Date of referral:  01/01/18               Reason for consult:  Facility Placement                Permission sought to share information with:  Facility Industrial/product designer granted to share information::  Yes, Verbal Permission Granted  Name::        Agency::  [facility]  Relationship::     Contact Information:     Housing/Transportation Living arrangements for the past 2 months:  Skilled Building surveyor of Information:  Adult Children, Development worker, community, Facility Patient Interpreter Needed:  None Criminal Activity/Legal Involvement Pertinent to Current Situation/Hospitalization:  No - Comment as needed Significant Relationships:  Adult Children, Merchandiser, retail Lives with:  Facility Resident Do you feel safe going back to the place where you live?  Yes Need for family participation in patient care:  Yes (Comment)(Patient is not alert or oriented)  Care giving concerns:  Patient admitted from [facility]   Social Worker assessment / plan:  The CSW visited the patient and her daughter, Candace Butler, at bedside to discuss discharge planning and goals of care. The patient was lethargic and not oriented; Candace Butler reported that her sister Candace Butler is the HCPOA. The CSW contacted Candace Butler who said she is unsure if her mother will be stable enough to return to [facility] in light of changes in her ability to breathe. The CSW provided emotional support and discussed the palliative care consult from Friday. Currently, the patient's HCPOA wants to see how she fare's overnight before making any decisions.  The patient can return to [facility] should she become medically stable according to Theressa Millard admissions coordinator. The family had previously discussed palliative care following the patient at [facility] should she return. CSW will continue to follow for discharge planning and goals  of care should the patient decline and hospice be the better option.  Employment status:  Retired Database administrator PT Recommendations:  Skilled Nursing Facility Information / Referral to community resources:     Patient/Family's Response to care:  The family is thankful for the contact and understandably upset.  Patient/Family's Understanding of and Emotional Response to Diagnosis, Current Treatment, and Prognosis:  The patient's HCPOA is aware of the current treatments and would like to see how her mother fares overnight prior to making a firm decision.  Emotional Assessment Appearance:  Appears stated age Attitude/Demeanor/Rapport:  Lethargic Affect (typically observed):  Stable Orientation:  Oriented to Self Alcohol / Substance use:  Never Used Psych involvement (Current and /or in the community):  No (Comment)  Discharge Needs  Concerns to be addressed:  Discharge Planning Concerns, Care Coordination Readmission within the last 30 days:  Yes Current discharge risk:  Terminally ill Barriers to Discharge:  Continued Medical Work up   [employer], LCSW 01/01/2018, 4:01 PM

## 2018-01-01 NOTE — Progress Notes (Signed)
[employer] -  at [facility]   PATIENT NAME: Candace Butler    MR#:  [id number]  DATE OF BIRTH:  1941-02-04  SUBJECTIVE:  CHIEF COMPLAINT:   Chief Complaint  Patient presents with  . Fall   Came with weakness, was here 2 weeks ago, Was discharged on oral Lasix and hydrochlorothiazide, came with generalized weakness and noted to have severe hyponatremia. Was lethargic today, but arousable.  REVIEW OF SYSTEMS:  CONSTITUTIONAL: No fever, severe fatigue or weakness.  EYES: No blurred or double vision.  EARS, NOSE, AND THROAT: No tinnitus or ear pain.  RESPIRATORY: No cough, shortness of breath, wheezing or hemoptysis.  CARDIOVASCULAR: No chest pain, orthopnea, edema.  GASTROINTESTINAL: No nausea, vomiting, diarrhea or abdominal pain.  GENITOURINARY: No dysuria, hematuria.  ENDOCRINE: No polyuria, nocturia,  HEMATOLOGY: No anemia, easy bruising or bleeding SKIN: No rash or lesion. MUSCULOSKELETAL: No joint pain or arthritis.   NEUROLOGIC: No tingling, numbness, weakness.  PSYCHIATRY: No anxiety or depression.   ROS  DRUG ALLERGIES:   Allergies  Allergen Reactions  . Indomethacin   . Vicodin [Hydrocodone-Acetaminophen]     VITALS:  Blood pressure (!) 94/54, pulse 61, temperature 97.8 F (36.6 C), temperature source Oral, resp. rate 15, height  (1.575 m), weight 67 kg (147 lb 11.3 oz), SpO2 93 %.  PHYSICAL EXAMINATION:  GENERAL:  77 y.o.-year-old patient lying in the bed with no acute distress.  EYES: Pupils equal, round, reactive to light and accommodation. No scleral icterus. Extraocular muscles intact.  HEENT: Head atraumatic, normocephalic. Oropharynx and nasopharynx clear.  NECK:  Supple, no jugular venous distention. No thyroid enlargement, no tenderness.  LUNGS: Normal breath sounds bilaterally, no wheezing, rales,rhonchi or crepitation. No use of accessory muscles of respiration. bipap in use. CARDIOVASCULAR: S1, S2 normal. No murmurs,  rubs, or gallops.  ABDOMEN: Soft, nontender, nondistended. Bowel sounds present. No organomegaly or mass.  EXTREMITIES: No pedal edema, cyanosis, or clubbing.  NEUROLOGIC: lethargic, follows simple commands. PSYCHIATRIC: The patient is lethargic.  SKIN: No obvious rash, lesion, or ulcer.   Physical Exam LABORATORY PANEL:   CBC Recent Labs  Lab 12/30/17 0511  WBC 9.4  HGB 12.4  HCT 35.4  PLT 347   ------------------------------------------------------------------------------------------------------------------  Chemistries  Recent Labs  Lab 12/28/17 1916  01/01/18 0501 01/01/18 1124  NA 102*   < > 128* 128*  K 3.3*   < > 5.4* 5.0  CL <65*   < > 79*  --   CO2 39*   < > 44*  --   GLUCOSE 108*   < > 81  --   BUN 20   < > 12  --   CREATININE 0.50   < > 0.41*  --   CALCIUM 8.8*   < > 8.6*  --   MG  --    < > 1.7  --   AST 50*  --   --   --   ALT 47  --   --   --   ALKPHOS 61  --   --   --   BILITOT 1.3*  --   --   --    < > = values in this interval not displayed.   ------------------------------------------------------------------------------------------------------------------  Cardiac Enzymes Recent Labs  Lab 12/28/17 2023  TROPONINI <0.03   ------------------------------------------------------------------------------------------------------------------  RADIOLOGY:  Ct Head Wo Contrast  Result Date: 01/01/2018 CLINICAL DATA:  Altered level of consciousness. Patient is now nonresponsive. EXAM: CT HEAD WITHOUT CONTRAST TECHNIQUE:  Contiguous axial images were obtained from the base of the skull through the vertex without intravenous contrast. COMPARISON:  CT head without contrast 12/28/2017. FINDINGS: Brain: No acute infarct, hemorrhage, or mass lesion is present. The ventricles are of normal size. No significant extraaxial fluid collection is present. No significant white matter disease is present. Brainstem and cerebellum are normal. Vascular: Atherosclerotic  calcifications are present within the cavernous internal carotid arteries. There is no hyperdense vessel. Skull: Calvarium is intact. No focal lytic or blastic lesions are present. The paranasal sinuses and mastoid air cells are clear. Sinuses/Orbits: The paranasal sinuses and mastoid air cells are clear. Globe calcifications are within normal limits for age. Globes and orbits are otherwise within normal limits. IMPRESSION: Normal CT of the head for age. Electronically Signed   By: Marin Roberts M.D.   On: 01/01/2018 08:40    ASSESSMENT AND PLAN:   Active Problems:   Acute hyponatremia  1.  Acute severe hyponatremia,   hypertonic saline.  Patient looks clinically volume depleted.   monitor intensive care unit for close observation and management.    check sodium level frequently.  We will hold hydrochlorothiazide and Lasix on discharge.  Appreciated help by nephrology. Continue IV fluid. 2.  Acute metabolic encephalopathy, likely related to hyponatremia.  Now have hypercapnea   continue to monitor clinically closely, while treating the underlying disorder.   requiring bipap for respi failure. 3.  Left lower extremity laceration, status post repair in the emergency room. 4.  COPD, stable.  Continue maintenance medications. 5.  Hypokalemia, will replace K per protocol.   As now potassium is rising, we will stop IV replacement. 6. Generalized weakness- need PT eval. 7. Ac respi failure with hypercapneia   bipap in use, DNR>  All the records are reviewed and case discussed with Care Management/Social Workerr. Management plans discussed with the patient, family and they are in agreement.  CODE STATUS: DNR.  TOTAL TIME TAKING CARE OF THIS PATIENT: 35 minutes.    POSSIBLE D/C IN 1-2 DAYS, DEPENDING ON CLINICAL CONDITION.   Altamese Dilling M.D on 01/01/2018   Between 7am to 6pm - Pager - [phone]  After 6pm go to [email or web address] - password [email or web address]  [facility]  Office  [phone]  CC: Primary care physician; Leotis Shames, MD  Note: This dictation was prepared with Dragon dictation along with smaller phrase technology. Any transcriptional errors that result from this process are unintentional.

## 2018-01-01 NOTE — Progress Notes (Signed)
Pharmacy Electrolyte Monitoring Consult:  Pharmacy consulted to assist in monitoring and replacing electrolytes in this 77 y.o. female admitted on 12/28/2017 with hyponatremia secondary to over diuresis.   Patient currently ordered NS at 59mL/hr.   Labs:  Sodium (mmol/L)  Date Value  01/01/2018 128 (L)   Potassium (mmol/L)  Date Value  01/01/2018 5.4 (H)   Magnesium (mg/dL)  Date Value  16/05/9603 1.7   Phosphorus (mg/dL)  Date Value  54/04/8118 1.7 (L)   Calcium (mg/dL)  Date Value  14/78/2956 8.6 (L)   Albumin (g/dL)  Date Value  21/30/8657 4.1    Assessment/Plan: Ordered K Phos Neutral 2 tablets PO Q4H x 4 doses.  Will obtain follow up electrolytes with am labs.   Pharmacy will continue to monitor and adjust per consult.  Stormy Card, [facility] 01/01/2018 10:26 AM

## 2018-01-01 NOTE — Progress Notes (Addendum)
Name: Candace Butler MRN: [id number] DOB: 1941/04/07    ADMISSION DATE:  12/28/2017 CONSULTATION DATE: 12/28/2017  REFERRING MD : Dr. Caryn Bee   CHIEF COMPLAINT: Fall   BRIEF PATIENT DESCRIPTION:  77 yo female from [facility] admitted following a fall found to have severe hypovolemic hyponatremia and hypochloridemia likely medication induced requiring hypertonic saline 3% gtt  SIGNIFICANT EVENTS/STUDIES:  05/22 Pt admitted to ICU  05/22 CT Head negative non contrasted CT appearance of the brain for age 44/26 unresponsive in Am  -CT head negative   HISTORY OF PRESENT ILLNESS:   This is a 77 yo female with a PMH of HTN, COPD, Chronic O2 , and Asthma.  She presented to [facility] ER on 05/22 via EMS from [facility] after falling out of her wheelchair, and the wheelchair fell on top of her head resulting in a left shin laceration and abrasion to her left knee. Per ER notes upon arrival to the ER the pt was alert and oriented x4, however her eyes were frequently rolling in the back of her head.  CT Head, blood alcohol level, and CXR negative.  Lab results revealed Na+ 102, K+ 3.3, chloride <65, CO2 39, lactic acid 1.1, and UA negative.  During previous hospitalization on 12/17/2017 her serum sodium was 132.  She was discharged from [facility] on 12/19/17 and  instructed to continue enalapril-hydrochlorothiazide and started on 60 mg furosemide daily.  During current hospitalization nephrology consulted plans to start hypertonic saline 3% gtt.  She was subsequently admitted to ICU for further workup and treatment.   REVIEW OF SYSTEMS:  Constitutional: Negative for fever, chills, weight loss, malaise/fatigue and diaphoresis.  HENT: Negative for hearing loss, ear pain, nosebleeds, congestion, sore throat, neck pain, tinnitus and ear discharge.   Eyes: Negative for blurred vision, double vision, photophobia, pain, discharge and redness.  Respiratory: Negative for cough, hemoptysis, sputum production,  shortness of breath, wheezing and stridor.   Cardiovascular: Negative for chest pain, palpitations, orthopnea, claudication, leg swelling and PND.  Gastrointestinal: Negative for heartburn, nausea, vomiting, abdominal pain, diarrhea, constipation, blood in stool and melena.  Genitourinary: Negative for dysuria, urgency, frequency, hematuria and flank pain.  Musculoskeletal: Negative for myalgias, back pain, joint pain and falls.  Skin: Negative for itching and rash.  Neurological: unresponsive initially only to noxious stimuli Endo/Heme/Allergies: Negative for environmental allergies and polydipsia. Does not bruise/bleed easily.  SUBJECTIVE:  Pt states she is confused   VITAL SIGNS: Temp:  [97.5 F (36.4 C)-98.3 F (36.8 C)] 98 F (36.7 C) (05/26 0800) Pulse Rate:  [63-90] 63 (05/26 0900) Resp:  [13-35] 13 (05/26 0900) BP: (90-145)/(37-83) 106/43 (05/26 0900) SpO2:  [96 %-100 %] 100 % (05/26 0900) Weight:  [147 lb 11.3 oz (67 kg)] 147 lb 11.3 oz (67 kg) (05/26 0500)  PHYSICAL EXAMINATION: General: initially unresponsive, difficult to awake Neuro: More awake now but sleepy, no obvious deficit HEENT: supple, no JVD  Cardiovascular: sinus rhythm  Lungs: CTA, no rales or rhonchi Abdomen: +BS x4, obese, soft, mild tenderness, non distended  Musculoskeletal: normal bulk and tone, no edema  Skin: laceration to left shin and abrasion stitches in place   Recent Labs  Lab 12/30/17 0511  12/31/17 0441  12/31/17 1805 01/01/18 0014 01/01/18 0501  NA 121*   < > 124*   < > 125* 128* 128*  K 3.0*  --  4.9  --   --   --  5.4*  CL 74*  --  79*  --   --   --  79*  CO2 40*  --  38*  --   --   --  44*  BUN 8  --  14  --   --   --  12  CREATININE 0.53  --  0.50  --   --   --  0.41*  GLUCOSE 112*  --  79  --   --   --  81   < > = values in this interval not displayed.   Recent Labs  Lab 12/28/17 1916 12/29/17 0609 12/30/17 0511  HGB 13.0 12.0 12.4  HCT 35.4 33.6* 35.4  WBC 12.6*  12.3* 9.4  PLT 332 312 347   Ct Head Wo Contrast  Result Date: 01/01/2018 CLINICAL DATA:  Altered level of consciousness. Patient is now nonresponsive. EXAM: CT HEAD WITHOUT CONTRAST TECHNIQUE: Contiguous axial images were obtained from the base of the skull through the vertex without intravenous contrast. COMPARISON:  CT head without contrast 12/28/2017. FINDINGS: Brain: No acute infarct, hemorrhage, or mass lesion is present. The ventricles are of normal size. No significant extraaxial fluid collection is present. No significant white matter disease is present. Brainstem and cerebellum are normal. Vascular: Atherosclerotic calcifications are present within the cavernous internal carotid arteries. There is no hyperdense vessel. Skull: Calvarium is intact. No focal lytic or blastic lesions are present. The paranasal sinuses and mastoid air cells are clear. Sinuses/Orbits: The paranasal sinuses and mastoid air cells are clear. Globe calcifications are within normal limits for age. Globes and orbits are otherwise within normal limits. IMPRESSION: Normal CT of the head for age. Electronically Signed   By: Marin Roberts M.D.   On: 01/01/2018 08:40    ASSESSMENT / PLAN:  1.Acute severe hyponatremia. Improving appropriately. Plan: continue management as per Nephrologist, plan is to allow the sodium to complete self correction now.  2.Acute metabolic encephalopathy,likely from medication. CT head negative. Received Xanax this AM Plan: Will re-evaluate later in day.  If still no improvement will get MRI.  Unlikely for Central pontine myelinosis as Na was not rapidly corrected.  ABG confirmed hypercarbia- placed on BIPAP with improvement.  3. Adrenal Insufficiency Plan: Start Hydrocortisone. Check TSH level.  4. Hypophosphatemia, Hypomagnesemia Plan: replace electrolytes prn  5.Left lower extremity laceration, status post repair in the emergency room. Plan: local care  6.Hx. COPD  without exacerbation. Plan: Continue maintenance medications.  7. Urinary retention of un clear etiology Plan: foley cath in place  8. Complaints of visual impairment- no deficits on visual fields test Plan: opthalmology F/U upon discharge.  If worsen will consult whilst in hospital  Family: daughter updated at bedside.  Disp:keep in ICU  Jackson Latino, MD  Pulmonary/Critical Care Pager [phone] (please enter 7 digits) PCCM Consult Pager [phone] (please enter 7 digits)

## 2018-01-01 NOTE — Progress Notes (Signed)
[facility] [place], [place] 01/01/18  Subjective:    5/25: This morning, Patient had difficulty voiding.  Bladder scan shows urine greater than 700.   5/26: Serum creatinine is normal Urine output has improved to almost 2400 cc. Patient is doing better. No acute events reports. This morning, patient was less responsive per ICU team. At present, she is sleepy but able to respond to questions and follow commands appropriately Potassium is high   Objective:  Vital signs in last 24 hours:  Temp:  [97.5 F (36.4 C)-98.3 F (36.8 C)] 98 F (36.7 C) (05/26 0800) Pulse Rate:  [63-90] 63 (05/26 0900) Resp:  [13-35] 13 (05/26 0900) BP: (90-145)/(37-83) 106/43 (05/26 0900) SpO2:  [96 %-100 %] 100 % (05/26 0900) Weight:  [67 kg (147 lb 11.3 oz)] 67 kg (147 lb 11.3 oz) (05/26 0500)  Weight change: -0.3 kg (-10.6 oz) Filed Weights   12/30/17 0500 12/31/17 0500 01/01/18 0500  Weight: 72.6 kg (160 lb 0.9 oz) 67.3 kg (148 lb 5.9 oz) 67 kg (147 lb 11.3 oz)    Intake/Output:    Intake/Output Summary (Last 24 hours) at 01/01/2018 0935 Last data filed at 01/01/2018 0700 Gross per 24 hour  Intake 1721.17 ml  Output 2375 ml  Net -653.83 ml     Physical Exam: General:  No acute distress, laying in the bed  HEENT  anicteric, moist oral mucus membranes  Neck  supple   Pulm/lungs  normal breathing effort, nasal cannula oxygen, clear  CVS/Heart  regular rate and rhythm  Abdomen:   Soft, nontender  Extremities:  No peripheral edema  Neurologic:  Alert, able to follow simple commands and able to move all 4 extremities.   Skin: No acute rashes          Basic Metabolic Panel:  Recent Labs  Lab 12/28/17 2009  12/29/17 1610  12/30/17 0511  12/31/17 0441 12/31/17 0829 12/31/17 1201 12/31/17 1805 01/01/18 0014 01/01/18 0501  NA 102*   < > 108*   < > 121*   < > 124* 124* 123* 125* 128* 128*  K 3.2*  --  3.4*  --  3.0*  --  4.9  --   --   --   --  5.4*  CL <65*  --   <65*  --  74*  --  79*  --   --   --   --  79*  CO2 39*  --  35*  --  40*  --  38*  --   --   --   --  44*  GLUCOSE 110*  --  86  --  112*  --  79  --   --   --   --  81  BUN 20  --  15  --  8  --  14  --   --   --   --  12  CREATININE 0.59  --  0.39*  --  0.53  --  0.50  --   --   --   --  0.41*  CALCIUM 8.8*  --  8.3*  --  8.9  --  8.8*  --   --   --   --  8.6*  MG 1.3*  --  1.1*  --  2.5*  --  2.0  --   --   --   --  1.7  PHOS  --   --  2.0*  --  3.0  --  2.1*  --   --   --   --  1.7*   < > = values in this interval not displayed.     CBC: Recent Labs  Lab 12/28/17 1916 12/29/17 0609 12/30/17 0511  WBC 12.6* 12.3* 9.4  HGB 13.0 12.0 12.4  HCT 35.4 33.6* 35.4  MCV 86.5 86.9 90.1  PLT 332 312 347     No results found for: HEPBSAG, HEPBSAB, HEPBIGM    Microbiology:  Recent Results (from the past 240 hour(s))  MRSA PCR Screening     Status: None   Collection Time: 12/28/17 10:27 PM  Result Value Ref Range Status   MRSA by PCR NEGATIVE NEGATIVE Final    Comment:        The GeneXpert MRSA Assay (FDA approved for NASAL specimens only), is one component of a comprehensive MRSA colonization surveillance program. It is not intended to diagnose MRSA infection nor to guide or monitor treatment for MRSA infections. Performed at [facility], [address] [place], [place] [address]     Coagulation Studies: No results for input(s): LABPROT, INR in the last 72 hours.  Urinalysis: No results for input(s): COLORURINE, LABSPEC, PHURINE, GLUCOSEU, HGBUR, BILIRUBINUR, KETONESUR, PROTEINUR, UROBILINOGEN, NITRITE, LEUKOCYTESUR in the last 72 hours.  Invalid input(s): APPERANCEUR    Imaging: Ct Head Wo Contrast  Result Date: 01/01/2018 CLINICAL DATA:  Altered level of consciousness. Patient is now nonresponsive. EXAM: CT HEAD WITHOUT CONTRAST TECHNIQUE: Contiguous axial images were obtained from the base of the skull through the vertex without intravenous  contrast. COMPARISON:  CT head without contrast 12/28/2017. FINDINGS: Brain: No acute infarct, hemorrhage, or mass lesion is present. The ventricles are of normal size. No significant extraaxial fluid collection is present. No significant white matter disease is present. Brainstem and cerebellum are normal. Vascular: Atherosclerotic calcifications are present within the cavernous internal carotid arteries. There is no hyperdense vessel. Skull: Calvarium is intact. No focal lytic or blastic lesions are present. The paranasal sinuses and mastoid air cells are clear. Sinuses/Orbits: The paranasal sinuses and mastoid air cells are clear. Globe calcifications are within normal limits for age. Globes and orbits are otherwise within normal limits. IMPRESSION: Normal CT of the head for age. Electronically Signed   By: Marin Roberts M.D.   On: 01/01/2018 08:40     Medications:   . sodium chloride Stopped (01/01/18 0745)   . aspirin EC  81 mg Oral Daily  . docusate sodium  100 mg Oral BID  . feeding supplement (ENSURE ENLIVE)  237 mL Oral BID BM  . fluticasone furoate-vilanterol  1 puff Inhalation Daily  . heparin  5,000 Units Subcutaneous Q8H  . hydrocortisone sod succinate (SOLU-CORTEF) inj  100 mg Intravenous Q8H  . multivitamin with minerals  1 tablet Oral Daily  . phosphorus  250 mg Oral TID  . tiotropium  18 mcg Inhalation Daily   acetaminophen **OR** acetaminophen, albuterol, ALPRAZolam, bisacodyl, hydrALAZINE, ondansetron **OR** ondansetron (ZOFRAN) IV, polyvinyl alcohol, traZODone  Assessment/ Plan:  77 y.o. female with Asthma, COPD, HTN was admitted on 12/28/2017 with Acute hyponatremia.    1.  Acute hyponatremia and hypokalemia Patient had normal sodium level of 136 on May 10  Hyponatremia seems to be secondary to overdiuresis in the setting of poor p.o. Intake  Initially 3% saline was used, now on NS Sodium level is 128 this morning; currently on NS at 50 cc/hr AM cortisol is  low suggesting adrenal insufficiency; started on iv steroids by icu  team  2.  Hypomagnesemia Expected to correct with normal diet  3.  Urinary retention Now has catheter     LOS: 4 Ellamae Lybeck Thedore Mins 5/26/20199:35 AM  [facility] [place], [place] [phone]  Note: This note was prepared with Dragon dictation. Any transcription errors are unintentional

## 2018-01-02 DIAGNOSIS — Z515 Encounter for palliative care: Secondary | ICD-10-CM

## 2018-01-02 DIAGNOSIS — J9811 Atelectasis: Secondary | ICD-10-CM

## 2018-01-02 LAB — BLOOD GAS, ARTERIAL
Acid-Base Excess: 21.9 mmol/L — ABNORMAL HIGH (ref 0.0–2.0)
BICARBONATE: 48.7 mmol/L — AB (ref 20.0–28.0)
Delivery systems: POSITIVE
EXPIRATORY PAP: 8
FIO2: 0.3
Inspiratory PAP: 18
O2 SAT: 96.7 %
PATIENT TEMPERATURE: 37
PO2 ART: 84 mmHg (ref 83.0–108.0)
pCO2 arterial: 70 mmHg (ref 32.0–48.0)
pH, Arterial: 7.45 (ref 7.350–7.450)

## 2018-01-02 LAB — HEMOGLOBIN AND HEMATOCRIT, BLOOD
HCT: 23 % — ABNORMAL LOW (ref 35.0–47.0)
HEMATOCRIT: 24.2 % — AB (ref 35.0–47.0)
Hemoglobin: 8.2 g/dL — ABNORMAL LOW (ref 12.0–16.0)
Hemoglobin: 7.9 g/dL — ABNORMAL LOW (ref 12.0–16.0)

## 2018-01-02 LAB — GLUCOSE, CAPILLARY
GLUCOSE-CAPILLARY: 96 mg/dL (ref 65–99)
GLUCOSE-CAPILLARY: 96 mg/dL (ref 65–99)
Glucose-Capillary: 106 mg/dL — ABNORMAL HIGH (ref 65–99)
Glucose-Capillary: 86 mg/dL (ref 65–99)
Glucose-Capillary: 207 mg/dL — ABNORMAL HIGH (ref 65–99)

## 2018-01-02 LAB — CBC
HEMATOCRIT: 24.9 % — AB (ref 35.0–47.0)
Hemoglobin: 8.5 g/dL — ABNORMAL LOW (ref 12.0–16.0)
MCH: 31.1 pg (ref 26.0–34.0)
MCHC: 34.1 g/dL (ref 32.0–36.0)
MCV: 91.3 fL (ref 80.0–100.0)
Platelets: 327 10*3/uL (ref 150–440)
RBC: 2.73 MIL/uL — ABNORMAL LOW (ref 3.80–5.20)
RDW: 13.7 % (ref 11.5–14.5)
WBC: 11.8 10*3/uL — ABNORMAL HIGH (ref 3.6–11.0)

## 2018-01-02 LAB — SODIUM
Sodium: 130 mmol/L — ABNORMAL LOW (ref 135–145)
Sodium: 131 mmol/L — ABNORMAL LOW (ref 135–145)
Sodium: 132 mmol/L — ABNORMAL LOW (ref 135–145)
Sodium: 135 mmol/L (ref 135–145)

## 2018-01-02 LAB — BASIC METABOLIC PANEL
Anion gap: 6 (ref 5–15)
BUN: 34 mg/dL — AB (ref 6–20)
CO2: 40 mmol/L — ABNORMAL HIGH (ref 22–32)
Calcium: 8.1 mg/dL — ABNORMAL LOW (ref 8.9–10.3)
Chloride: 84 mmol/L — ABNORMAL LOW (ref 101–111)
Creatinine, Ser: 0.47 mg/dL (ref 0.44–1.00)
GFR calc Af Amer: >60 mL/min (ref >60)
Glucose, Bld: 105 mg/dL — ABNORMAL HIGH (ref 65–99)
POTASSIUM: 5.1 mmol/L (ref 3.5–5.1)
SODIUM: 130 mmol/L — AB (ref 135–145)

## 2018-01-02 LAB — PHOSPHORUS: PHOSPHORUS: 1.7 mg/dL — AB (ref 2.5–4.6)

## 2018-01-02 LAB — OCCULT BLOOD X 1 CARD TO LAB, STOOL: Fecal Occult Bld: POSITIVE — AB

## 2018-01-02 LAB — MAGNESIUM: MAGNESIUM: 2 mg/dL (ref 1.7–2.4)

## 2018-01-02 MED ORDER — PIPERACILLIN-TAZOBACTAM 3.375 G IVPB
3.3750 g | Freq: Three times a day (TID) | INTRAVENOUS | Status: DC
  Administered 2018-01-02 – 2018-01-05 (×9): 3.375 g via INTRAVENOUS
  Filled 2018-01-02 (×9): qty 50

## 2018-01-02 MED ORDER — SODIUM GLYCEROPHOSPHATE 1 MMOLE/ML IV SOLN
20.0000 mmol | Freq: Once | INTRAVENOUS | Status: AC
  Administered 2018-01-02: 20 mmol via INTRAVENOUS
  Filled 2018-01-02: qty 20

## 2018-01-02 MED ORDER — HYDROCORTISONE NA SUCCINATE PF 100 MG IJ SOLR
50.0000 mg | Freq: Three times a day (TID) | INTRAMUSCULAR | Status: DC
  Administered 2018-01-02 – 2018-01-03 (×2): 50 mg via INTRAVENOUS
  Filled 2018-01-02 (×2): qty 2

## 2018-01-02 NOTE — Progress Notes (Signed)
Pharmacy Electrolyte Monitoring Consult:  Pharmacy consulted to assist in monitoring and replacing electrolytes in this 77 y.o. female admitted on 12/28/2017 with hyponatremia secondary to over diuresis.    Labs:  Sodium (mmol/L)  Date Value  01/02/2018 130 (L)  01/02/2018 131 (L)   Potassium (mmol/L)  Date Value  01/02/2018 5.1   Magnesium (mg/dL)  Date Value  11/91/4782 2.0   Phosphorus (mg/dL)  Date Value  95/62/1308 1.7 (L)   Calcium (mg/dL)  Date Value  65/78/4696 8.1 (L)   Albumin (g/dL)  Date Value  29/52/8413 4.1   5/26 : pt ordered K Phos Neutral 2 tablets PO Q4H x 4 doses. ICU MD changed to Glycophos 20 mmol IV x1.   Assessment/Plan: Phos level unchanged today. Will repeat Glycophos 20 mmol IV x1. Will obtain follow up electrolytes with am labs.   Pharmacy will continue to monitor and adjust per consult.  Marty Heck, [facility] 01/02/2018 9:34 AM

## 2018-01-02 NOTE — Progress Notes (Signed)
MD changed IPAP and EPAP parametees earlier

## 2018-01-02 NOTE — Progress Notes (Signed)
[facility] [place], [place] 01/02/18  Subjective:  Patient seen at bedside. Still on BiPAP this a.m. Still on 0.9 normal saline.    Objective:  Vital signs in last 24 hours:  Temp:  [97.3 F (36.3 C)-97.8 F (36.6 C)] 97.4 F (36.3 C) (05/27 0128) Pulse Rate:  [61-94] 87 (05/27 0600) Resp:  [13-25] 17 (05/27 0600) BP: (85-135)/(35-94) 114/76 (05/27 0600) SpO2:  [90 %-100 %] 99 % (05/27 0600) FiO2 (%):  [30 %] 30 % (05/27 0857) Weight:  [74.6 kg (164 lb 7.4 oz)] 74.6 kg (164 lb 7.4 oz) (05/27 0500)  Weight change: 7.6 kg (16 lb 12.1 oz) Filed Weights   12/31/17 0500 01/01/18 0500 01/02/18 0500  Weight: 67.3 kg (148 lb 5.9 oz) 67 kg (147 lb 11.3 oz) 74.6 kg (164 lb 7.4 oz)    Intake/Output:    Intake/Output Summary (Last 24 hours) at 01/02/2018 1610 Last data filed at 01/02/2018 0600 Gross per 24 hour  Intake 872.5 ml  Output 950 ml  Net -77.5 ml     Physical Exam: General:  Currently on bipap  HEENT  anicteric, moist oral mucus membranes  Neck  supple   Pulm/lungs  scattered rhonchi, on bipap  CVS/Heart  regular rate and rhythm  Abdomen:   Soft, nontender  Extremities:  No peripheral edema  Neurologic:  Awake, alert, follows simple commands  Skin:  No acute rashes          Basic Metabolic Panel:  Recent Labs  Lab 12/29/17 0609  12/30/17 0511  12/31/17 0441  01/01/18 0501 01/01/18 1124 01/01/18 1831 01/01/18 2355 01/02/18 0500  NA 108*   < > 121*   < > 124*   < > 128* 128* 129* 130* 131*  130*  K 3.4*  --  3.0*  --  4.9  --  5.4* 5.0  --   --  5.1  CL <65*  --  74*  --  79*  --  79*  --   --   --  84*  CO2 35*  --  40*  --  38*  --  44*  --   --   --  40*  GLUCOSE 86  --  112*  --  79  --  81  --   --   --  105*  BUN 15  --  8  --  14  --  12  --   --   --  34*  CREATININE 0.39*  --  0.53  --  0.50  --  0.41*  --   --   --  0.47  CALCIUM 8.3*  --  8.9  --  8.8*  --  8.6*  --   --   --  8.1*  MG 1.1*  --  2.5*  --  2.0  --   1.7  --   --   --  2.0  PHOS 2.0*  --  3.0  --  2.1*  --  1.7*  --   --   --  1.7*   < > = values in this interval not displayed.     CBC: Recent Labs  Lab 12/28/17 1916 12/29/17 0609 12/30/17 0511  WBC 12.6* 12.3* 9.4  HGB 13.0 12.0 12.4  HCT 35.4 33.6* 35.4  MCV 86.5 86.9 90.1  PLT 332 312 347     No results found for: HEPBSAG, HEPBSAB, HEPBIGM    Microbiology:  Recent Results (from the past 240  hour(s))  MRSA PCR Screening     Status: None   Collection Time: 12/28/17 10:27 PM  Result Value Ref Range Status   MRSA by PCR NEGATIVE NEGATIVE Final    Comment:        The GeneXpert MRSA Assay (FDA approved for NASAL specimens only), is one component of a comprehensive MRSA colonization surveillance program. It is not intended to diagnose MRSA infection nor to guide or monitor treatment for MRSA infections. Performed at [facility], [address] [place], [place] [address]     Coagulation Studies: No results for input(s): LABPROT, INR in the last 72 hours.  Urinalysis: No results for input(s): COLORURINE, LABSPEC, PHURINE, GLUCOSEU, HGBUR, BILIRUBINUR, KETONESUR, PROTEINUR, UROBILINOGEN, NITRITE, LEUKOCYTESUR in the last 72 hours.  Invalid input(s): APPERANCEUR    Imaging: Ct Head Wo Contrast  Result Date: 01/01/2018 CLINICAL DATA:  Altered level of consciousness. Patient is now nonresponsive. EXAM: CT HEAD WITHOUT CONTRAST TECHNIQUE: Contiguous axial images were obtained from the base of the skull through the vertex without intravenous contrast. COMPARISON:  CT head without contrast 12/28/2017. FINDINGS: Brain: No acute infarct, hemorrhage, or mass lesion is present. The ventricles are of normal size. No significant extraaxial fluid collection is present. No significant white matter disease is present. Brainstem and cerebellum are normal. Vascular: Atherosclerotic calcifications are present within the cavernous internal carotid arteries. There is no  hyperdense vessel. Skull: Calvarium is intact. No focal lytic or blastic lesions are present. The paranasal sinuses and mastoid air cells are clear. Sinuses/Orbits: The paranasal sinuses and mastoid air cells are clear. Globe calcifications are within normal limits for age. Globes and orbits are otherwise within normal limits. IMPRESSION: Normal CT of the head for age. Electronically Signed   By: Marin Roberts M.D.   On: 01/01/2018 08:40     Medications:   . dextrose 5 % and 0.9% NaCl 30 mL/hr at 01/02/18 0607   . aspirin EC  81 mg Oral Daily  . docusate sodium  100 mg Oral BID  . feeding supplement (ENSURE ENLIVE)  237 mL Oral BID BM  . fluticasone furoate-vilanterol  1 puff Inhalation Daily  . heparin  5,000 Units Subcutaneous Q8H  . hydrocortisone sod succinate (SOLU-CORTEF) inj  100 mg Intravenous Q8H  . multivitamin with minerals  1 tablet Oral Daily  . tiotropium  18 mcg Inhalation Daily   acetaminophen **OR** acetaminophen, albuterol, ALPRAZolam, bisacodyl, hydrALAZINE, ondansetron **OR** ondansetron (ZOFRAN) IV, polyvinyl alcohol, traZODone  Assessment/ Plan:  77 y.o. female with Asthma, COPD, HTN was admitted on 12/28/2017 with Acute hyponatremia.    1.  Acute hyponatremia and hypokalemia Patient had normal sodium level of 136 on May 10  Hyponatremia seems to be secondary to overdiuresis in the setting of poor p.o. Intake  Initially 3% saline was used, now on NS -Serum sodium up to 131.  Continue 0.9 normal saline at 30 cc/h.  Also continue on hydrocortisone 100 mg IV every 8 hours.  2.  Hypomagnesemia Serum magnesium acceptable at 2.0.  3.  Urinary retention Continue Foley catheter drainage.     LOS: 5 Kleo Dungee 5/27/20199:27 AM  [facility] [place], [place] [phone]  Note: This note was prepared with Dragon dictation. Any transcription errors are unintentional

## 2018-01-02 NOTE — Progress Notes (Signed)
Pharmacy Antibiotic Note  Candace Butler is a 77 y.o. female admitted on 12/28/2017 with pneumonia.  Pharmacy has been consulted for Zosyn dosing.  Plan: Zosyn 3.375g IV q8h (4 hour infusion).  Height:  (157.5 cm) Weight: 164 lb 7.4 oz (74.6 kg) IBW/kg (Calculated) : 50.1  Temp (24hrs), Avg:97.4 F (36.3 C), Min:97.3 F (36.3 C), Max:97.7 F (36.5 C)  Recent Labs  Lab 12/28/17 1916  12/28/17 2023 12/28/17 2258 12/29/17 0609 12/30/17 0511 12/31/17 0441 01/01/18 0501 01/02/18 0500 01/02/18 1246  WBC 12.6*  --   --   --  12.3* 9.4  --   --   --  11.8*  CREATININE 0.50   < >  --   --  0.39* 0.53 0.50 0.41* 0.47  --   LATICACIDVEN  --   --  1.1 1.0  --   --   --   --   --   --    < > = values in this interval not displayed.    Estimated Creatinine Clearance: 53.9 mL/min (by C-G formula based on SCr of 0.47 mg/dL).    Allergies  Allergen Reactions  . Indomethacin   . Vicodin [Hydrocodone-Acetaminophen]     Antimicrobials this admission: Zosyn 5/27 >>   Microbiology results: *5/22 MRSA PCR: (-)  Thank you for allowing pharmacy to be a part of this patient's care.  Lowella Bandy 01/02/2018 2:43 PM

## 2018-01-02 NOTE — Progress Notes (Signed)
[facility] [place] [facility]   PATIENT NAME: Candace Butler    MR#:  [id number]  DATE OF BIRTH:  [birth year]  SUBJECTIVE:  CHIEF COMPLAINT:   Chief Complaint  Patient presents with  . Fall   Came with weakness, was here 2 weeks ago, Was discharged on oral Lasix and hydrochlorothiazide, came with generalized weakness and noted to have severe hyponatremia. Was lethargic today, but arousable.  REVIEW OF SYSTEMS:  CONSTITUTIONAL: No fever, severe fatigue or weakness.  EYES: No blurred or double vision.  EARS, NOSE, AND THROAT: No tinnitus or ear pain.  RESPIRATORY: No cough, shortness of breath, wheezing or hemoptysis.  CARDIOVASCULAR: No chest pain, orthopnea, edema.  GASTROINTESTINAL: No nausea, vomiting, diarrhea or abdominal pain.  GENITOURINARY: No dysuria, hematuria.  ENDOCRINE: No polyuria, nocturia,  HEMATOLOGY: No anemia, easy bruising or bleeding SKIN: No rash or lesion. MUSCULOSKELETAL: No joint pain or arthritis.   NEUROLOGIC: No tingling, numbness, weakness.  PSYCHIATRY: No anxiety or depression.   ROS  DRUG ALLERGIES:   Allergies  Allergen Reactions  . Indomethacin   . Vicodin [Hydrocodone-Acetaminophen]     VITALS:  Blood pressure (!) 150/46, pulse (!) 108, temperature (!) 97.3 F (36.3 C), temperature source Axillary, resp. rate (!) 22, height  (1.575 m), weight 74.6 kg (164 lb 7.4 oz), SpO2 99 %.  PHYSICAL EXAMINATION:  GENERAL:  77 y.o.-year-old patient lying in the bed with no acute distress.  EYES: Pupils equal, round, reactive to light and accommodation. No scleral icterus. Extraocular muscles intact.  HEENT: Head atraumatic, normocephalic. Oropharynx and nasopharynx clear.  NECK:  Supple, no jugular venous distention. No thyroid enlargement, no tenderness.  LUNGS: Normal breath sounds bilaterally, no wheezing, rales,rhonchi or crepitation. No use of accessory muscles of respiration. bipap in use. CARDIOVASCULAR: S1, S2  normal. No murmurs, rubs, or gallops.  ABDOMEN: Soft, nontender, nondistended. Bowel sounds present. No organomegaly or mass.  EXTREMITIES: No pedal edema, cyanosis, or clubbing.  NEUROLOGIC:  follows simple commands. PSYCHIATRIC: The patient is on bipap.  SKIN: No obvious rash, lesion, or ulcer.   Physical Exam LABORATORY PANEL:   CBC Recent Labs  Lab 01/02/18 1246  WBC 11.8*  HGB 8.5*  HCT 24.9*  PLT 327   ------------------------------------------------------------------------------------------------------------------  Chemistries  Recent Labs  Lab 12/28/17 1916  01/02/18 0500 01/02/18 1246  NA 102*   < > 131*  130* 132*  K 3.3*   < > 5.1  --   CL <65*   < > 84*  --   CO2 39*   < > 40*  --   GLUCOSE 108*   < > 105*  --   BUN 20   < > 34*  --   CREATININE 0.50   < > 0.47  --   CALCIUM 8.8*   < > 8.1*  --   MG  --    < > 2.0  --   AST 50*  --   --   --   ALT 47  --   --   --   ALKPHOS 61  --   --   --   BILITOT 1.3*  --   --   --    < > = values in this interval not displayed.   ------------------------------------------------------------------------------------------------------------------  Cardiac Enzymes Recent Labs  Lab 12/28/17 2023  TROPONINI <0.03   ------------------------------------------------------------------------------------------------------------------  RADIOLOGY:  Ct Head Wo Contrast  Result Date: 01/01/2018 CLINICAL DATA:  Altered level of consciousness. Patient is  now nonresponsive. EXAM: CT HEAD WITHOUT CONTRAST TECHNIQUE: Contiguous axial images were obtained from the base of the skull through the vertex without intravenous contrast. COMPARISON:  CT head without contrast 12/28/2017. FINDINGS: Brain: No acute infarct, hemorrhage, or mass lesion is present. The ventricles are of normal size. No significant extraaxial fluid collection is present. No significant white matter disease is present. Brainstem and cerebellum are normal. Vascular:  Atherosclerotic calcifications are present within the cavernous internal carotid arteries. There is no hyperdense vessel. Skull: Calvarium is intact. No focal lytic or blastic lesions are present. The paranasal sinuses and mastoid air cells are clear. Sinuses/Orbits: The paranasal sinuses and mastoid air cells are clear. Globe calcifications are within normal limits for age. Globes and orbits are otherwise within normal limits. IMPRESSION: Normal CT of the head for age. Electronically Signed   By: Marin Roberts M.D.   On: 01/01/2018 08:40    ASSESSMENT AND PLAN:   Active Problems:   Acute hyponatremia  1.  Acute severe hyponatremia,   hypertonic saline.  Patient looks clinically volume depleted.   monitor intensive care unit for close observation and management.    check sodium level frequently.  We will hold hydrochlorothiazide and Lasix on discharge.  Appreciated help by nephrology. Continue IV fluid.  sodium much improved now. 2.  Acute metabolic encephalopathy, likely related to hyponatremia.  Now have hypercapnea   continue to monitor clinically closely, while treating the underlying disorder.   requiring bipap for respi failure. 3.  Left lower extremity laceration, status post repair in the emergency room. 4.  COPD, stable.  Continue maintenance medications. 5.  Hypokalemia, will replace K per protocol.   As now potassium is rising, we will stop IV replacement. 6. Generalized weakness- need PT eval. 7. Ac respi failure with hypercapneia   bipap in use, DNR>  All the records are reviewed and case discussed with Care Management/Social Workerr. Management plans discussed with the patient, family and they are in agreement.  CODE STATUS: DNR.  TOTAL TIME TAKING CARE OF THIS PATIENT: 35 minutes.    POSSIBLE D/C IN 1-2 DAYS, DEPENDING ON CLINICAL CONDITION.   Altamese Dilling M.D on 01/02/2018   Between 7am to 6pm - Pager - [phone]  After 6pm go to [email or web address]  - password [email or web address]  [facility]  Office  [phone]  CC: Primary care physician; Leotis Shames, MD  Note: This dictation was prepared with Dragon dictation along with smaller phrase technology. Any transcriptional errors that result from this process are unintentional.

## 2018-01-02 NOTE — Progress Notes (Addendum)
Daily Progress Note   Patient Name: Candace Butler       Date: 01/02/2018 DOB: Jul 01, 1941  Age: 77 y.o. MRN#: [id number] Attending Physician: Altamese Dilling, * Primary Care Physician: Leotis Shames, MD Admit Date: 12/28/2017  Reason for Consultation/Follow-up: Support.  Subjective: Patient is resting in bed on BIPAP. Multiple family members at the bedside.  Per nursing, she has been on and off BIPAP. She eats and drinks when off BIPAP.  She has had a sensation of shortness of breath when coming off BIPAP. When patient was asked how she is feeling she extended one hand, fingers out, palm to the ground and rotated the wrist back and forth to indicate "so-so". Family very quiet and only answering questions in words or small phrases. They  state she was started on Xanax, and took this at home.    Could try Morphine for sensation of SOB.     Length of Stay: 5  Current Medications: Scheduled Meds:  . aspirin EC  81 mg Oral Daily  . docusate sodium  100 mg Oral BID  . feeding supplement (ENSURE ENLIVE)  237 mL Oral BID BM  . fluticasone furoate-vilanterol  1 puff Inhalation Daily  . hydrocortisone sod succinate (SOLU-CORTEF) inj  50 mg Intravenous Q8H  . multivitamin with minerals  1 tablet Oral Daily  . tiotropium  18 mcg Inhalation Daily    Continuous Infusions: . dextrose 5 % and 0.9% NaCl 30 mL/hr at 01/02/18 0607  . sodium glycerophosphate 0.9% NaCl IVPB 20 mmol (01/02/18 1059)    PRN Meds: acetaminophen **OR** acetaminophen, albuterol, ALPRAZolam, bisacodyl, hydrALAZINE, ondansetron **OR** ondansetron (ZOFRAN) IV, polyvinyl alcohol, traZODone  Physical Exam  Constitutional: No distress.  Pulmonary/Chest:  BIPAP in place.   Neurological: She is alert.            Vital  Signs: BP (!) 150/46   Pulse (!) 108   Temp (!) 97.3 F (36.3 C) (Axillary)   Resp (!) 22   Ht  (1.575 m)   Wt 74.6 kg (164 lb 7.4 oz)   SpO2 99%   BMI 30.08 kg/m  SpO2: SpO2: 99 % O2 Device: O2 Device: Bi-PAP O2 Flow Rate: O2 Flow Rate (L/min): 3 L/min  Intake/output summary:   Intake/Output Summary (Last 24 hours) at 01/02/2018 1420 Last data filed at  01/02/2018 1059 Gross per 24 hour  Intake 785 ml  Output 700 ml  Net 85 ml   LBM: Last BM Date: 01/01/18 Baseline Weight: Weight: 74.8 kg (165 lb) Most recent weight: Weight: 74.6 kg (164 lb 7.4 oz)       Palliative Assessment/Data: 30%      Patient Active Problem List   Diagnosis Date Noted  . Acute hyponatremia 12/28/2017  . Respiratory failure (HCC) 12/08/2017  . COPD exacerbation (HCC) 06/13/2017    Palliative Care Assessment & Plan   Patient Profile: 77 y.o. female admitted on 12/28/2017 from [facility] with increased confusion and also after falling out of her wheelchair and obtaining a left shin laceration and abrasion to her left knee. She has a past medical history significant for hypertension, COPD, chronic home O2 -4L/[place], and asthma.   Assessment/Recommendations/Plan:  Continuing current care.  Recommend Morphine for SOB or air hunger.     Code Status:    Code Status Orders  (From admission, onward)        Start     Ordered   12/30/17 1628  Do not attempt resuscitation (DNR)  Continuous    Question Answer Comment  In the event of cardiac or respiratory ARREST Do not call a "code blue"   In the event of cardiac or respiratory ARREST Do not perform Intubation, CPR, defibrillation or ACLS   In the event of cardiac or respiratory ARREST Use medication by any route, position, wound care, and other measures to relive pain and suffering. May use oxygen, suction and manual treatment of airway obstruction as needed for comfort.      12/30/17 1628    Code Status History    Date Active  Date Inactive Code Status Order ID Comments User Context   12/28/2017 2223 12/30/2017 1628 Full Code [id number]  Cammy Copa, MD Inpatient   12/18/2017 1106 12/19/2017 2102 Full Code [id number]  Bertrum Sol, MD Inpatient   12/15/2017 1141 12/18/2017 1106 DNR [id number]  Milagros Loll, MD Inpatient   12/08/2017 1154 12/15/2017 1141 Full Code [id number]  Bertrum Sol, MD Inpatient   06/13/2017 1722 06/17/2017 2152 Full Code [id number]  Shaune Pollack, MD Inpatient    Advance Directive Documentation     Most Recent Value  Type of Advance Directive  Healthcare Power of Laurice Record - Daughter]  Pre-existing out of facility DNR order (yellow form or pink MOST form)  -  "MOST" Form in Place?  -         Care plan was discussed with RN  Thank you for allowing the Palliative Medicine Team to assist in the care of this patient.   Total Time 15 min Prolonged Time Billed  No      Greater than 50%  of this time was spent counseling and coordinating care related to the above assessment and plan.  Morton Stall, NP  Please contact Palliative Medicine Team phone at [phone] for questions and concerns.

## 2018-01-02 NOTE — Progress Notes (Signed)
Patient had black stool, Dr. Duanne Limerick notified and fecal occult sent to lab and cbc checked. Fecal occult positive and hgb had dropped to 8.5. Dr. Duanne Limerick aware, heparin to be d/c'd. Patient stable, complains of SOB intermittently when off bipap. Trudee Kuster

## 2018-01-02 NOTE — Progress Notes (Signed)
Name: Candace Butler MRN: [id number] DOB: September 04, 1940     CONSULTATION DATE: 12/28/2017  Continues to require BiPAP was poor tolerance to the nasal cannula  PAST MEDICAL HISTORY :   has a past medical history of Asthma, COPD (chronic obstructive pulmonary disease) (HCC), and Hypertension.  has a past surgical history that includes Colon surgery and Abdominal hysterectomy. Prior to Admission medications   Medication Sig Start Date End Date Taking? Authorizing Provider  ADVAIR DISKUS 250-50 MCG/DOSE AEPB Take 1 puff 2 (two) times daily by mouth. 05/28/17  Yes [provider]  albuterol (PROVENTIL HFA;VENTOLIN HFA) 108 (90 Base) MCG/ACT inhaler Inhale 2 puffs every 6 (six) hours as needed into the lungs for wheezing or shortness of breath. 06/16/17  Yes Shaune Pollack, MD  ALPRAZolam Prudy Feeler) 0.25 MG tablet Take 0.25 mg by mouth 3 (three) times daily as needed for anxiety.   Yes [provider]  aspirin EC 81 MG EC tablet Take 1 tablet (81 mg total) by mouth daily. 12/20/17  Yes Salary, Evelena Asa, MD  carvedilol (COREG) 3.125 MG tablet Take 1 tablet (3.125 mg total) by mouth 2 (two) times daily with a meal. 12/19/17  Yes Salary, Montell D, MD  enalapril-hydrochlorothiazide (VASERETIC) 10-25 MG tablet Take 1 tablet by mouth daily. 12/19/17  Yes Salary, Evelena Asa, MD  furosemide (LASIX) 20 MG tablet Take 3 tablets (60 mg total) by mouth daily. 12/20/17  Yes Salary, Evelena Asa, MD  Multiple Vitamin (MULTIVITAMIN WITH MINERALS) TABS tablet Take 1 tablet by mouth daily. 12/20/17  Yes Salary, Evelena Asa, MD  predniSONE (DELTASONE) 10 MG tablet Take 4 tablets (40 mg total) by mouth daily with breakfast. 12/20/17  Yes Salary, Montell D, MD  tiotropium (SPIRIVA) 18 MCG inhalation capsule Place 1 capsule (18 mcg total) daily into inhaler and inhale. 06/18/17  Yes Shaune Pollack, MD  traZODone (DESYREL) 50 MG tablet Take 1 tablet (50 mg total) by mouth at bedtime as needed for sleep. 12/19/17  Yes Salary,  Evelena Asa, MD  ALPRAZolam (XANAX) 0.5 MG tablet Take 1 tablet (0.5 mg total) by mouth 3 (three) times daily as needed for anxiety. 12/19/17   Salary, Evelena Asa, MD  feeding supplement, ENSURE ENLIVE, (ENSURE ENLIVE) LIQD Take 237 mLs by mouth 2 (two) times daily between meals. 12/19/17   Salary, Evelena Asa, MD   Allergies  Allergen Reactions  . Indomethacin   . Vicodin [Hydrocodone-Acetaminophen]     FAMILY HISTORY:  family history is not on file. SOCIAL HISTORY:  reports that she has quit smoking. Her smoking use included cigarettes. She has never used smokeless tobacco. She reports that she does not drink alcohol or use drugs.  REVIEW OF SYSTEMS:   Unable to obtain due to critical illness   VITAL SIGNS: Temp:  [97.3 F (36.3 C)-97.7 F (36.5 C)] 97.4 F (36.3 C) (05/27 0128) Pulse Rate:  [61-106] 106 (05/27 0900) Resp:  [13-27] 27 (05/27 0900) BP: (86-135)/(49-94) 99/67 (05/27 0900) SpO2:  [90 %-100 %] 100 % (05/27 0900) FiO2 (%):  [30 %] 30 % (05/27 0857) Weight:  [74.6 kg (164 lb 7.4 oz)] 74.6 kg (164 lb 7.4 oz) (05/27 0500)   hysical Examination:  Awake, anxious following simple commands and moving all extremities On the nasal cannula 5 L/min, no distress, bilateral equal air entry with diminished air movements and no sounds S1 + S2 audible plus no murmur Benign abdomen with feeble peristalsis,  ASSESSMENT / PLAN:  Acute on chronic respiratory failure with baseline  home O2 3 L/min nasal cannula -Optimize FiO2 for O2 sat 88 to 92%, monitor work of breathing and continuous BiPAP -CODE STATUS DNR  COPD exacerbation -Bronchodilators and tapering steroids start on empiric Zosyn  Altered mental status (improved) with metabolic encephalopathy secondary to hyponatremia.  No acute intracranial abnormalities and CT head. -Optimize meds and monitor neuro status  Acute hyponatremia (improved) -Management as per renal  Acute urinary retention -Foley catheter in  place  Laceration of left lower extremities status post surgical intervention -Wound care  Anemia with Hb dropped 4 g relative to 72 hours ago.  -Monitor H&H and keep hemoglobin more than 8 g/dL  Hypocalcemia and hypophosphatemia -Replete and monitor electrolytes as per renal.  DNR  DVT & GI prophylaxis.  Continue with supportive care. Family was updated at the bedside with the plan of care  Critical care time 40 minutes

## 2018-01-02 NOTE — Progress Notes (Signed)
Received call from respiratory therapy that pt has critical CO2 value of 70. Dr. Duanne Limerick notified, no new orders received. Pt's primary RN notified as well. Pt currently resting comfortably on bipap.

## 2018-01-03 ENCOUNTER — Inpatient Hospital Stay: Payer: Medicare HMO

## 2018-01-03 DIAGNOSIS — K921 Melena: Secondary | ICD-10-CM

## 2018-01-03 DIAGNOSIS — D62 Acute posthemorrhagic anemia: Secondary | ICD-10-CM

## 2018-01-03 LAB — CBC WITH DIFFERENTIAL/PLATELET
BASOS PCT: 0 %
Basophils Absolute: 0 10*3/uL (ref 0–0.1)
EOS PCT: 0 %
Eosinophils Absolute: 0 10*3/uL (ref 0–0.7)
HCT: 29 % — ABNORMAL LOW (ref 35.0–47.0)
Hemoglobin: 10 g/dL — ABNORMAL LOW (ref 12.0–16.0)
Lymphocytes Relative: 12 %
Lymphs Abs: 1.5 10*3/uL (ref 1.0–3.6)
MCH: 31.2 pg (ref 26.0–34.0)
MCHC: 34.3 g/dL (ref 32.0–36.0)
MCV: 90.9 fL (ref 80.0–100.0)
MONO ABS: 0.9 10*3/uL (ref 0.2–0.9)
MONOS PCT: 8 %
Neutro Abs: 9.6 10*3/uL — ABNORMAL HIGH (ref 1.4–6.5)
Neutrophils Relative %: 80 %
Platelets: 328 10*3/uL (ref 150–440)
RBC: 3.19 MIL/uL — ABNORMAL LOW (ref 3.80–5.20)
RDW: 14.4 % (ref 11.5–14.5)
WBC: 12 10*3/uL — ABNORMAL HIGH (ref 3.6–11.0)

## 2018-01-03 LAB — ABO/RH: ABO/RH(D): B POS

## 2018-01-03 LAB — GLUCOSE, CAPILLARY
GLUCOSE-CAPILLARY: 139 mg/dL — AB (ref 65–99)
GLUCOSE-CAPILLARY: 175 mg/dL — AB (ref 65–99)
GLUCOSE-CAPILLARY: 86 mg/dL (ref 65–99)
GLUCOSE-CAPILLARY: 87 mg/dL (ref 65–99)
GLUCOSE-CAPILLARY: 90 mg/dL (ref 65–99)
Glucose-Capillary: 101 mg/dL — ABNORMAL HIGH (ref 65–99)

## 2018-01-03 LAB — HEMOGLOBIN AND HEMATOCRIT, BLOOD
HEMATOCRIT: 29.9 % — AB (ref 35.0–47.0)
Hemoglobin: 10.2 g/dL — ABNORMAL LOW (ref 12.0–16.0)

## 2018-01-03 LAB — SODIUM
SODIUM: 136 mmol/L (ref 135–145)
Sodium: 134 mmol/L — ABNORMAL LOW (ref 135–145)

## 2018-01-03 LAB — BASIC METABOLIC PANEL
ANION GAP: 9 (ref 5–15)
BUN: 31 mg/dL — ABNORMAL HIGH (ref 6–20)
CO2: 37 mmol/L — ABNORMAL HIGH (ref 22–32)
CREATININE: 0.57 mg/dL (ref 0.44–1.00)
Calcium: 8.8 mg/dL — ABNORMAL LOW (ref 8.9–10.3)
Chloride: 91 mmol/L — ABNORMAL LOW (ref 101–111)
GFR calc non Af Amer: >60 mL/min (ref >60)
Glucose, Bld: 120 mg/dL — ABNORMAL HIGH (ref 65–99)
Potassium: 4.3 mmol/L (ref 3.5–5.1)
SODIUM: 137 mmol/L (ref 135–145)

## 2018-01-03 LAB — APTT

## 2018-01-03 LAB — MAGNESIUM: MAGNESIUM: 1.9 mg/dL (ref 1.7–2.4)

## 2018-01-03 LAB — PROTIME-INR
INR: 0.95
Prothrombin Time: 12.6 seconds (ref 11.4–15.2)

## 2018-01-03 LAB — PREPARE RBC (CROSSMATCH)

## 2018-01-03 LAB — PHOSPHORUS: PHOSPHORUS: 3.1 mg/dL (ref 2.5–4.6)

## 2018-01-03 MED ORDER — SODIUM CHLORIDE 0.9 % IV SOLN
Freq: Once | INTRAVENOUS | Status: AC
  Administered 2018-01-03: 06:00:00 via INTRAVENOUS

## 2018-01-03 MED ORDER — ALBUTEROL SULFATE (2.5 MG/3ML) 0.083% IN NEBU
2.5000 mg | INHALATION_SOLUTION | RESPIRATORY_TRACT | Status: DC | PRN
Start: 2018-01-03 — End: 2018-01-10

## 2018-01-03 MED ORDER — IPRATROPIUM-ALBUTEROL 0.5-2.5 (3) MG/3ML IN SOLN
3.0000 mL | Freq: Four times a day (QID) | RESPIRATORY_TRACT | Status: DC
  Administered 2018-01-03 – 2018-01-10 (×25): 3 mL via RESPIRATORY_TRACT
  Filled 2018-01-03 (×24): qty 3

## 2018-01-03 MED ORDER — FENTANYL CITRATE (PF) 100 MCG/2ML IJ SOLN
25.0000 ug | Freq: Once | INTRAMUSCULAR | Status: AC
  Administered 2018-01-03: 25 ug via INTRAVENOUS
  Filled 2018-01-03: qty 2

## 2018-01-03 MED ORDER — BUDESONIDE 0.25 MG/2ML IN SUSP
0.2500 mg | Freq: Two times a day (BID) | RESPIRATORY_TRACT | Status: DC
  Administered 2018-01-03 – 2018-01-04 (×2): 0.25 mg via RESPIRATORY_TRACT
  Filled 2018-01-03 (×2): qty 2

## 2018-01-03 MED ORDER — PANTOPRAZOLE SODIUM 40 MG PO TBEC
40.0000 mg | DELAYED_RELEASE_TABLET | Freq: Every day | ORAL | Status: DC
  Administered 2018-01-03 – 2018-01-08 (×6): 40 mg via ORAL
  Filled 2018-01-03 (×6): qty 1

## 2018-01-03 MED ORDER — HYDROCORTISONE NA SUCCINATE PF 100 MG IJ SOLR: 50.0000 mg | Freq: Two times a day (BID) | INTRAMUSCULAR | Status: DC

## 2018-01-03 MED ORDER — PREDNISONE 10 MG PO TABS
40.0000 mg | ORAL_TABLET | Freq: Every day | ORAL | Status: DC
  Administered 2018-01-04: 40 mg via ORAL
  Filled 2018-01-03: qty 4

## 2018-01-03 NOTE — Progress Notes (Signed)
Pharmacy Electrolyte Monitoring Consult:  Pharmacy consulted to assist in monitoring and replacing electrolytes in this 77 y.o. female admitted on 12/28/2017 with hyponatremia secondary to over diuresis.    Labs:  Sodium (mmol/L)  Date Value  01/03/2018 134 (L)   Potassium (mmol/L)  Date Value  01/03/2018 4.3   Magnesium (mg/dL)  Date Value  16/05/9603 1.9   Phosphorus (mg/dL)  Date Value  54/04/8118 3.1   Calcium (mg/dL)  Date Value  14/78/2956 8.8 (L)   Albumin (g/dL)  Date Value  21/30/8657 4.1    Assessment/Plan: Electrolytes are WNL. Will f/u AM labs.   Pharmacy will continue to monitor and adjust per consult.  Valentina Gu, [facility] 01/03/2018 4:07 PM

## 2018-01-03 NOTE — Consult Note (Signed)
Midge Minium, MD [facility]  [address], [address] [place], [place] [address] Phone: [phone] Fax : [phone]  Consultation  Referring Provider:     Dr. Elisabeth Pigeon Primary Care Physician:  Leotis Shames, MD Primary Gastroenterologist:  Gentry Fitz          Reason for Consultation:     Anemia  Date of Admission:  12/28/2017 Date of Consultation:  01/03/2018         HPI:   Candace Butler is a 77 y.o. female who was admitted with confusion and hyponatremia.  The patient was found to have a drop in her hemoglobin and was reported to have melena.  The patient states she had a colonoscopy a few years ago in [place].  The patient was found to have positive Hemoccult stools.  The patient was started on a PPI. The patient's hemoglobin in on the 27th was 8.2 with her hemoglobin on the 24th of 12.4.  The patient went down to 7.9 yesterday and was transfused one unit of blood and was 10.0 this morning.  The patient denies any abdominal pain nausea vomiting fevers or chills.  She is also on 4 L of oxygen at home for chronic lung disease.  The patient states she quit smoking only last November.  Past Medical History:  Diagnosis Date  . Asthma   . COPD (chronic obstructive pulmonary disease) (HCC)   . Hypertension     Past Surgical History:  Procedure Laterality Date  . ABDOMINAL HYSTERECTOMY    . COLON SURGERY      Prior to Admission medications   Medication Sig Start Date End Date Taking? Authorizing Provider  ADVAIR DISKUS 250-50 MCG/DOSE AEPB Take 1 puff 2 (two) times daily by mouth. 05/28/17  Yes [provider]  albuterol (PROVENTIL HFA;VENTOLIN HFA) 108 (90 Base) MCG/ACT inhaler Inhale 2 puffs every 6 (six) hours as needed into the lungs for wheezing or shortness of breath. 06/16/17  Yes Shaune Pollack, MD  ALPRAZolam Prudy Feeler) 0.25 MG tablet Take 0.25 mg by mouth 3 (three) times daily as needed for anxiety.   Yes [provider]  aspirin EC 81 MG EC tablet Take 1 tablet (81  mg total) by mouth daily. 12/20/17  Yes Salary, Evelena Asa, MD  carvedilol (COREG) 3.125 MG tablet Take 1 tablet (3.125 mg total) by mouth 2 (two) times daily with a meal. 12/19/17  Yes Salary, Montell D, MD  enalapril-hydrochlorothiazide (VASERETIC) 10-25 MG tablet Take 1 tablet by mouth daily. 12/19/17  Yes Salary, Evelena Asa, MD  furosemide (LASIX) 20 MG tablet Take 3 tablets (60 mg total) by mouth daily. 12/20/17  Yes Salary, Evelena Asa, MD  Multiple Vitamin (MULTIVITAMIN WITH MINERALS) TABS tablet Take 1 tablet by mouth daily. 12/20/17  Yes Salary, Evelena Asa, MD  predniSONE (DELTASONE) 10 MG tablet Take 4 tablets (40 mg total) by mouth daily with breakfast. 12/20/17  Yes Salary, Montell D, MD  tiotropium (SPIRIVA) 18 MCG inhalation capsule Place 1 capsule (18 mcg total) daily into inhaler and inhale. 06/18/17  Yes Shaune Pollack, MD  traZODone (DESYREL) 50 MG tablet Take 1 tablet (50 mg total) by mouth at bedtime as needed for sleep. 12/19/17  Yes Salary, Evelena Asa, MD  ALPRAZolam (XANAX) 0.5 MG tablet Take 1 tablet (0.5 mg total) by mouth 3 (three) times daily as needed for anxiety. 12/19/17   Salary, Evelena Asa, MD  feeding supplement, ENSURE ENLIVE, (ENSURE ENLIVE) LIQD Take 237 mLs by mouth 2 (two) times daily between meals.  12/19/17   Salary, Evelena Asa, MD    History reviewed. No pertinent family history.   Social History   Tobacco Use  . Smoking status: Former Smoker    Types: Cigarettes  . Smokeless tobacco: Never Used  Substance Use Topics  . Alcohol use: No    Frequency: Never  . Drug use: No    Allergies as of 12/28/2017 - Review Complete 12/28/2017  Allergen Reaction Noted  . Indomethacin  06/13/2017  . Vicodin [hydrocodone-acetaminophen]  06/13/2017    Review of Systems:    All systems reviewed and negative except where noted in HPI.   Physical Exam:  Vital signs in last 24 hours: Temp:  [97.8 F (36.6 C)-98.8 F (37.1 C)] 98.8 F (37.1 C) (05/28 1600) Pulse Rate:  [70-112]  108 (05/28 1703) Resp:  [14-39] 26 (05/28 1703) BP: (80-132)/(45-79) 101/64 (05/28 1703) SpO2:  [95 %-100 %] 95 % (05/28 1703) Weight:  [165 lb 2 oz (74.9 kg)] 165 lb 2 oz (74.9 kg) (05/28 0500) Last BM Date: 01/01/18 General:   Pleasant, cooperative in NAD Head:  Normocephalic and atraumatic. Eyes:   No icterus.   Conjunctiva pink. PERRLA. Ears:  Normal auditory acuity. Neck:  Supple; no masses or thyroidomegaly Lungs: Diffuse wheezing with rhonchi.  Heart:  Regular rate and rhythm;  Without murmur, clicks, rubs or gallops Abdomen:  Soft, nondistended, nontender. Normal bowel sounds. No appreciable masses or hepatomegaly.  No rebound or guarding.  Rectal:  Not performed. Msk:  Symmetrical without gross deformities.   Extremities:  Without edema, cyanosis or clubbing. Neurologic:  Alert and oriented x3;  grossly normal neurologically. Skin:  Intact without significant lesions or rashes. Cervical Nodes:  No significant cervical adenopathy. Psych:  Alert and cooperative. Normal affect.  LAB RESULTS: Recent Labs    01/02/18 1246 01/02/18 1453 01/02/18 2224 01/03/18 1031  WBC 11.8*  --   --  12.0*  HGB 8.5* 8.2* 7.9* 10.0*  HCT 24.9* 24.2* 23.0* 29.0*  PLT 327  --   --  328   BMET Recent Labs    01/01/18 0501 01/01/18 1124  01/02/18 0500  01/02/18 1925 01/03/18 1031 01/03/18 2224  NA 128* 128*   < > 131*  130*   < > 135 137 134*  K 5.4* 5.0  --  5.1  --   --  4.3  --   CL 79*  --   --  84*  --   --  91*  --   CO2 44*  --   --  40*  --   --  37*  --   GLUCOSE 81  --   --  105*  --   --  120*  --   BUN 12  --   --  34*  --   --  31*  --   CREATININE 0.41*  --   --  0.47  --   --  0.57  --   CALCIUM 8.6*  --   --  8.1*  --   --  8.8*  --    < > = values in this interval not displayed.   LFT No results for input(s): PROT, ALBUMIN, AST, ALT, ALKPHOS, BILITOT, BILIDIR, IBILI in the last 72 hours. PT/INR Recent Labs    01/03/18 1037  LABPROT 12.6  INR 0.95     STUDIES: Dg Chest Port 1 View  Result Date: 01/03/2018 CLINICAL DATA:  Atelectasis. EXAM: PORTABLE CHEST 1 VIEW COMPARISON:  12/28/2017. FINDINGS:  Mediastinum and hilar structures normal. Cardiomegaly with normal pulmonary vascularity. Mild left base subsegmental atelectasis. Small left pleural effusion. No pneumothorax. IMPRESSION: Mild left base subsegmental atelectasis. Small left pleural effusion. Electronically Signed   By: Maisie Fus  Register   On: 01/03/2018 06:55      Impression / Plan:   Assessment: Active Problems:   Acute hyponatremia Heme positive stools Anemia    Plan:  Candace Butler is a 77 y.o. y/o female with an admission for hyponatremia.  The patient had a baseline hemoglobin that was normal.  The patient Dropped her hemoglobin to 7.9 and received 1 unit of blood and was 10.0 this morning.  The patient is on chronic oxygen and appears short of breath with abnormal breath sounds consistent with chronic COPD.  I recommend monitoring the patient's hemoglobin and hematocrit and optimizing the patient's lung function before putting the patient to sleep for any further procedures.  The patient should be continued on her PPI.  The patient has been explained the plan and agrees with it.   Thank you for involving me in the care of this patient.      LOS: 6 days   Midge Minium, MD  01/03/2018, 5:40 PM    Note: This dictation was prepared with Dragon dictation along with smaller phrase technology. Any transcriptional errors that result from this process are unintentional.

## 2018-01-03 NOTE — Progress Notes (Signed)
eLink Physician-Brief Progress Note Patient Name: Sehar Sedano DOB: 08-06-1941 MRN: [id number]   Date of Service  01/03/2018  HPI/Events of Note  Anemia - Hgb = 7.9. Progress note relates desire to keep Hgb > 8.0.   eICU Interventions  Will transfuse 1 unit PRBC.     Intervention Category Major Interventions: Other:  Sommer,Steven Dennard Nip 01/03/2018, 2:32 AM

## 2018-01-03 NOTE — Progress Notes (Signed)
Physical Therapy Treatment Patient Details Name: Candace Butler MRN: [id number] DOB: [birth year] Today's Date: 01/03/2018    History of Present Illness Candace Butler is a 77yo white female who comes to [facility] after a few days weakness, AMS, hallucination, and eventually a fall from [facility] sustaining 2 skin injuries to LLE. Pt found to have severe HypoNa+. Pt comes in from [facility] where she had been in STR working with PT. Daughter reports the patient's progress was slow, stil not safe for return to home yet.     PT Comments    Discussed with nurse prior and ok for session given.  She tolerated minimal exercises with rest breaks.  Pt requested during session that air conditioning be turned up "I need more air".  Sats 99-100%.  Rest given and pt continued to c/o feeling short of breath.  Pt stated she was unable to do more at this time and was assisted with repositioning for comfort.  Discussed with primary nurse.    HR monitored during session.  115-121 at rest and with minimal exercises but HR did increase very briefly to 127 at one point.   Follow Up Recommendations  SNF     Equipment Recommendations  None recommended by PT    Recommendations for Other Services       Precautions / Restrictions Precautions Precautions: Fall Restrictions Weight Bearing Restrictions: No    Mobility  Bed Mobility               General bed mobility comments: deferred  Transfers                    Ambulation/Gait                 Stairs             Wheelchair Mobility    Modified Rankin (Stroke Patients Only)       Balance                                            Cognition Arousal/Alertness: Awake/alert   Overall Cognitive Status: Within Functional Limits for tasks assessed                                        Exercises Other Exercises Other Exercises: ankle pumps, heel slides, SLR x 10 AAROM    General Comments         Pertinent Vitals/Pain Pain Assessment: No/denies pain    Home Living                      Prior Function            PT Goals (current goals can now be found in the care plan section) Progress towards PT goals: Not progressing toward goals - comment    Frequency    Min 2X/week      PT Plan Current plan remains appropriate    Co-evaluation              AM-PAC PT "6 Clicks" Daily Activity  Outcome Measure  Difficulty turning over in bed (including adjusting bedclothes, sheets and blankets)?: Unable Difficulty moving from lying on back to sitting on the side of the bed? : Unable Difficulty sitting down on and  standing up from a chair with arms (e.g., wheelchair, bedside commode, etc,.)?: Unable Help needed moving to and from a bed to chair (including a wheelchair)?: Total Help needed walking in hospital room?: Total Help needed climbing 3-5 steps with a railing? : Total 6 Click Score: 6    End of Session Equipment Utilized During Treatment: Oxygen Activity Tolerance: Patient limited by fatigue Patient left: in bed;with bed alarm set;with call bell/phone within reach Nurse Communication: Other (comment)       Time: 1610-9604 PT Time Calculation (min) (ACUTE ONLY): 13 min  Charges:  $Therapeutic Exercise: 8-22 mins                    G Codes:       Danielle Dess, PTA 01/03/18, 9:49 AM

## 2018-01-03 NOTE — Progress Notes (Signed)
[facility] [place] [facility]   PATIENT NAME: Candace Butler    MR#:  [id number]  DATE OF BIRTH:  07/08/1941  SUBJECTIVE:  CHIEF COMPLAINT:   Chief Complaint  Patient presents with  . Fall   Came with weakness, was here 2 weeks ago, Was discharged on oral Lasix and hydrochlorothiazide, came with generalized weakness and noted to have severe hyponatremia. Hyponatremia improved, Mental status improved after bipap use.  REVIEW OF SYSTEMS:  CONSTITUTIONAL: No fever, severe fatigue or weakness.  EYES: No blurred or double vision.  EARS, NOSE, AND THROAT: No tinnitus or ear pain.  RESPIRATORY: No cough, shortness of breath, wheezing or hemoptysis.  CARDIOVASCULAR: No chest pain, orthopnea, edema.  GASTROINTESTINAL: No nausea, vomiting, diarrhea or abdominal pain.  GENITOURINARY: No dysuria, hematuria.  ENDOCRINE: No polyuria, nocturia,  HEMATOLOGY: No anemia, easy bruising or bleeding SKIN: No rash or lesion. MUSCULOSKELETAL: No joint pain or arthritis.   NEUROLOGIC: No tingling, numbness, weakness.  PSYCHIATRY: No anxiety or depression.   ROS  DRUG ALLERGIES:   Allergies  Allergen Reactions  . Indomethacin   . Vicodin [Hydrocodone-Acetaminophen]     VITALS:  Blood pressure 123/62, pulse (!) 105, temperature 98.8 F (37.1 C), temperature source Oral, resp. rate (!) 28, height  (1.575 m), weight 74.9 kg (165 lb 2 oz), SpO2 98 %.  PHYSICAL EXAMINATION:  GENERAL:  77 y.o.-year-old patient lying in the bed with no acute distress.  EYES: Pupils equal, round, reactive to light and accommodation. No scleral icterus. Extraocular muscles intact.  HEENT: Head atraumatic, normocephalic. Oropharynx and nasopharynx clear.  NECK:  Supple, no jugular venous distention. No thyroid enlargement, no tenderness.  LUNGS: Normal breath sounds bilaterally, no wheezing, rales,rhonchi or crepitation. No use of accessory muscles of respiration. Nasal canula in  use. CARDIOVASCULAR: S1, S2 normal. No murmurs, rubs, or gallops.  ABDOMEN: Soft, nontender, nondistended. Bowel sounds present. No organomegaly or mass.  EXTREMITIES: No pedal edema, cyanosis, or clubbing.  NEUROLOGIC:  follows simple commands. Generalized weakness. PSYCHIATRIC: The patient is alert and oriented.  SKIN: No obvious rash, lesion, or ulcer.   Physical Exam LABORATORY PANEL:   CBC Recent Labs  Lab 01/03/18 1031  WBC 12.0*  HGB 10.0*  HCT 29.0*  PLT 328   ------------------------------------------------------------------------------------------------------------------  Chemistries  Recent Labs  Lab 12/28/17 1916  01/03/18 1031 01/03/18 2224  NA 102*   < > 137 134*  K 3.3*   < > 4.3  --   CL <65*   < > 91*  --   CO2 39*   < > 37*  --   GLUCOSE 108*   < > 120*  --   BUN 20   < > 31*  --   CREATININE 0.50   < > 0.57  --   CALCIUM 8.8*   < > 8.8*  --   MG  --    < > 1.9  --   AST 50*  --   --   --   ALT 47  --   --   --   ALKPHOS 61  --   --   --   BILITOT 1.3*  --   --   --    < > = values in this interval not displayed.   ------------------------------------------------------------------------------------------------------------------  Cardiac Enzymes Recent Labs  Lab 12/28/17 2023  TROPONINI <0.03   ------------------------------------------------------------------------------------------------------------------  RADIOLOGY:  Dg Chest Port 1 View  Result Date: 01/03/2018 CLINICAL DATA:  Atelectasis. EXAM:  PORTABLE CHEST 1 VIEW COMPARISON:  12/28/2017. FINDINGS: Mediastinum and hilar structures normal. Cardiomegaly with normal pulmonary vascularity. Mild left base subsegmental atelectasis. Small left pleural effusion. No pneumothorax. IMPRESSION: Mild left base subsegmental atelectasis. Small left pleural effusion. Electronically Signed   By: Maisie Fus  Register   On: 01/03/2018 06:55    ASSESSMENT AND PLAN:   Active Problems:   Acute  hyponatremia  1.  Acute severe hyponatremia,   hypertonic saline.  Patient looks clinically volume depleted.   monitor intensive care unit for close observation and management.    check sodium level frequently.  We will hold hydrochlorothiazide and Lasix on discharge.  Appreciated help by nephrology. Continue IV fluid.  sodium much improved now. 2.  Acute metabolic encephalopathy, likely related to hyponatremia.  Now have hypercapnea   continue to monitor clinically closely, while treating the underlying disorder.   requiring bipap for respi failure.   Improved on nasal canula oxygen. 3.  Left lower extremity laceration, status post repair in the emergency room. 4.  COPD, stable.  Continue maintenance medications. 5.  Hypokalemia, will replace K per protocol.   As now potassium is rising, we will stop IV replacement. 6. Generalized weakness- need PT eval.   Need SNF. 7. Ac respi failure with hypercapneia   bipap in use, DNR>    Improved now.  All the records are reviewed and case discussed with Care Management/Social Workerr. Management plans discussed with the patient, family and they are in agreement.  CODE STATUS: DNR.  TOTAL TIME TAKING CARE OF THIS PATIENT: 35 minutes.    POSSIBLE D/C IN 1-2 DAYS, DEPENDING ON CLINICAL CONDITION.   Altamese Dilling M.D on 01/03/2018   Between 7am to 6pm - Pager - [phone]  After 6pm go to [email or web address] - password [email or web address]  [facility]  Office  [phone]  CC: Primary care physician; Leotis Shames, MD  Note: This dictation was prepared with Dragon dictation along with smaller phrase technology. Any transcriptional errors that result from this process are unintentional.

## 2018-01-03 NOTE — Care Management (Deleted)
Update from karen with [facility]- typically [employer] does want PEG placed prior to authorization of LTAC. Clydie Braun is still checking with [facility] to see if they can accept patient to LTAC.  Niece has already visited [place]. Irving Burton with [facility] updated.

## 2018-01-03 NOTE — Progress Notes (Signed)
Daily Progress Note   Patient Name: Candace Butler       Date: 01/03/2018 DOB: 1940/12/07  Age: 77 y.o. MRN#: [id number] Attending Physician: Altamese Dilling, * Primary Care Physician: Leotis Shames, MD Admit Date: 12/28/2017  Reason for Consultation/Follow-up: Support.  Subjective: Patient is resting in bed not on BIPAP. No family at bedside. Offered support and rapport building. She states she is feeling better. She states she has 3 daughters and 1 son who live near by. She states her COPD is worse with the hot weather, things have been well prior to this hospitalization.    Length of Stay: 6  Current Medications: Scheduled Meds:  . aspirin EC  81 mg Oral Daily  . budesonide (PULMICORT) nebulizer solution  0.25 mg Nebulization BID  . docusate sodium  100 mg Oral BID  . feeding supplement (ENSURE ENLIVE)  237 mL Oral BID BM  . ipratropium-albuterol  3 mL Nebulization Q6H  . multivitamin with minerals  1 tablet Oral Daily  . pantoprazole  40 mg Oral Daily  . [START ON 01/04/2018] predniSONE  40 mg Oral Q breakfast    Continuous Infusions: . dextrose 5 % and 0.9% NaCl Stopped (01/03/18 7425)  . piperacillin-tazobactam (ZOSYN)  IV Stopped (01/03/18 0944)    PRN Meds: acetaminophen **OR** acetaminophen, albuterol, ALPRAZolam, bisacodyl, hydrALAZINE, ondansetron **OR** ondansetron (ZOFRAN) IV, polyvinyl alcohol, traZODone  Physical Exam  Constitutional: No distress.  Neurological: She is alert.  Skin: Skin is warm and dry.            Vital Signs: BP 105/65   Pulse 87   Temp 98.8 F (37.1 C) (Axillary)   Resp 18   Ht  (1.575 m)   Wt 74.9 kg (165 lb 2 oz)   SpO2 98%   BMI 30.20 kg/m  SpO2: SpO2: 98 % O2 Device: O2 Device: Nasal Cannula O2 Flow Rate: O2 Flow  Rate (L/min): 1 L/min  Intake/output summary:   Intake/Output Summary (Last 24 hours) at 01/03/2018 1306 Last data filed at 01/03/2018 1100 Gross per 24 hour  Intake 1865.5 ml  Output 2250 ml  Net -384.5 ml   LBM: Last BM Date: 01/01/18 Baseline Weight: Weight: 74.8 kg (165 lb) Most recent weight: Weight: 74.9 kg (165 lb 2 oz)       Palliative Assessment/Data: 30%  Patient Active Problem List   Diagnosis Date Noted  . Acute hyponatremia 12/28/2017  . Respiratory failure (HCC) 12/08/2017  . COPD exacerbation (HCC) 06/13/2017    Palliative Care Assessment & Plan   Patient Profile: 77 y.o. female admitted on 12/28/2017 from [facility] with increased confusion and also after falling out of her wheelchair and obtaining a left shin laceration and abrasion to her left knee. She has a past medical history significant for hypertension, COPD, chronic home O2 -4L/[place], and asthma.   Assessment/Recommendations/Plan:  Continuing current care.  Recommend Morphine for SOB or air hunger.     Code Status:    Code Status Orders  (From admission, onward)        Start     Ordered   12/30/17 1628  Do not attempt resuscitation (DNR)  Continuous    Question Answer Comment  In the event of cardiac or respiratory ARREST Do not call a "code blue"   In the event of cardiac or respiratory ARREST Do not perform Intubation, CPR, defibrillation or ACLS   In the event of cardiac or respiratory ARREST Use medication by any route, position, wound care, and other measures to relive pain and suffering. May use oxygen, suction and manual treatment of airway obstruction as needed for comfort.      12/30/17 1628    Code Status History    Date Active Date Inactive Code Status Order ID Comments User Context   12/28/2017 2223 12/30/2017 1628 Full Code [id number]  Cammy Copa, MD Inpatient   12/18/2017 1106 12/19/2017 2102 Full Code [id number]  Bertrum Sol, MD Inpatient   12/15/2017 1141  12/18/2017 1106 DNR [id number]  Milagros Loll, MD Inpatient   12/08/2017 1154 12/15/2017 1141 Full Code [id number]  Bertrum Sol, MD Inpatient   06/13/2017 1722 06/17/2017 2152 Full Code [id number]  Shaune Pollack, MD Inpatient    Advance Directive Documentation     Most Recent Value  Type of Advance Directive  Healthcare Power of [place] Record - Daughter]  Pre-existing out of facility DNR order (yellow form or pink MOST form)  -  "MOST" Form in Place?  -         Care plan was discussed with RN  Thank you for allowing the Palliative Medicine Team to assist in the care of this patient.   Total Time 15 min Prolonged Time Billed  No      Greater than 50%  of this time was spent counseling and coordinating care related to the above assessment and plan.  Morton Stall, NP  Please contact Palliative Medicine Team phone at [phone] for questions and concerns.

## 2018-01-03 NOTE — Progress Notes (Signed)
Patient complaining of pain all over, Dr. Duanne Limerick gave verbal order for fentanyl IV once. Order placed. Trudee Kuster

## 2018-01-03 NOTE — Progress Notes (Signed)
Name: Candace Butler MRN: [id number] DOB: May 08, 1941     CONSULTATION DATE: 12/28/2017  Subjective & objectives: Of BiPAP, tolerating nasal cannula, receiving 1 unit of RBC and no distress  PAST MEDICAL HISTORY :   has a past medical history of Asthma, COPD (chronic obstructive pulmonary disease) (HCC), and Hypertension.  has a past surgical history that includes Colon surgery and Abdominal hysterectomy. Prior to Admission medications   Medication Sig Start Date End Date Taking? Authorizing Provider  ADVAIR DISKUS 250-50 MCG/DOSE AEPB Take 1 puff 2 (two) times daily by mouth. 05/28/17  Yes [provider]  albuterol (PROVENTIL HFA;VENTOLIN HFA) 108 (90 Base) MCG/ACT inhaler Inhale 2 puffs every 6 (six) hours as needed into the lungs for wheezing or shortness of breath. 06/16/17  Yes Shaune Pollack, MD  ALPRAZolam Prudy Feeler) 0.25 MG tablet Take 0.25 mg by mouth 3 (three) times daily as needed for anxiety.   Yes [provider]  aspirin EC 81 MG EC tablet Take 1 tablet (81 mg total) by mouth daily. 12/20/17  Yes Salary, Evelena Asa, MD  carvedilol (COREG) 3.125 MG tablet Take 1 tablet (3.125 mg total) by mouth 2 (two) times daily with a meal. 12/19/17  Yes Salary, Montell D, MD  enalapril-hydrochlorothiazide (VASERETIC) 10-25 MG tablet Take 1 tablet by mouth daily. 12/19/17  Yes Salary, Evelena Asa, MD  furosemide (LASIX) 20 MG tablet Take 3 tablets (60 mg total) by mouth daily. 12/20/17  Yes Salary, Evelena Asa, MD  Multiple Vitamin (MULTIVITAMIN WITH MINERALS) TABS tablet Take 1 tablet by mouth daily. 12/20/17  Yes Salary, Evelena Asa, MD  predniSONE (DELTASONE) 10 MG tablet Take 4 tablets (40 mg total) by mouth daily with breakfast. 12/20/17  Yes Salary, Montell D, MD  tiotropium (SPIRIVA) 18 MCG inhalation capsule Place 1 capsule (18 mcg total) daily into inhaler and inhale. 06/18/17  Yes Shaune Pollack, MD  traZODone (DESYREL) 50 MG tablet Take 1 tablet (50 mg total) by mouth at bedtime as needed  for sleep. 12/19/17  Yes Salary, Evelena Asa, MD  ALPRAZolam (XANAX) 0.5 MG tablet Take 1 tablet (0.5 mg total) by mouth 3 (three) times daily as needed for anxiety. 12/19/17   Salary, Evelena Asa, MD  feeding supplement, ENSURE ENLIVE, (ENSURE ENLIVE) LIQD Take 237 mLs by mouth 2 (two) times daily between meals. 12/19/17   Salary, Evelena Asa, MD   Allergies  Allergen Reactions  . Indomethacin   . Vicodin [Hydrocodone-Acetaminophen]     FAMILY HISTORY:  family history is not on file. SOCIAL HISTORY:  reports that she has quit smoking. Her smoking use included cigarettes. She has never used smokeless tobacco. She reports that she does not drink alcohol or use drugs.  REVIEW OF SYSTEMS:   Unable to obtain due to critical illness   VITAL SIGNS: Temp:  [97 F (36.1 C)-98.6 F (37 C)] 98.6 F (37 C) (05/28 0629) Pulse Rate:  [70-111] 87 (05/28 0700) Resp:  [14-37] 17 (05/28 0700) BP: (94-150)/(35-79) 110/56 (05/28 0700) SpO2:  [97 %-100 %] 100 % (05/28 0700) FiO2 (%):  [30 %] 30 % (05/27 1405) Weight:  [74.9 kg (165 lb 2 oz)] 74.9 kg (165 lb 2 oz) (05/28 0500)  Physical Examination:   Awake, oriented and nonfocal neuro exam  On nasal cannula 3 l/min, no distress, bilateral equal air entry and no adventitious sounds S1 + S2 audible plus no murmur Benign abdomen with feeble peristalsis, Wasted extremities with no edema  ASSESSMENT / PLAN:  Acute on chronic  respiratory failure with baseline home O2 3-4 L/min nasal cannula -Optimize FiO2 for O2 sat 88 to 92%, monitor work of breathing and continuous BiPAP -CODE STATUS DNR  COPD exacerbation -Bronchodilators and tapering steroids start on empiric Zosyn  Altered mental status (improved) with metabolic encephalopathy secondary to hyponatremia.  No acute intracranial abnormalities and CT head. -Optimize meds and monitor neuro status  Acute hyponatremia (improved) -Management as per renal  Acute urinary retention -Foley catheter  in place  Laceration of left lower extremities status post surgical intervention -Wound care  Anemia with Hb drop, melena and occult blood in his stool positive -Monitor H&H and keep hemoglobin more than 8 g/dL -Follow GI consult and start on PPI.  Echo 12/08/2017.  LVEF 60-65% and grade 1 diastolic dysfunction  DNR  DVT & GI prophylaxis.  Continue with supportive care. Family was updated at the bedside with the plan of care  Critical care time 30 minutes

## 2018-01-03 NOTE — Progress Notes (Signed)
[facility], [place] 01/03/18  Subjective:  Patient appears to be doing better today. Off of BiPAP on nasal cannula. Serum sodium improved to 135 today.    Objective:  Vital signs in last 24 hours:  Temp:  [97 F (36.1 C)-98.8 F (37.1 C)] 98.8 F (37.1 C) (05/28 0900) Pulse Rate:  [70-112] 110 (05/28 1000) Resp:  [14-39] 39 (05/28 1000) BP: (94-150)/(35-79) 119/68 (05/28 1000) SpO2:  [97 %-100 %] 100 % (05/28 1000) FiO2 (%):  [30 %] 30 % (05/27 1405) Weight:  [74.9 kg (165 lb 2 oz)] 74.9 kg (165 lb 2 oz) (05/28 0500)  Weight change: 0.3 kg (10.6 oz) Filed Weights   01/01/18 0500 01/02/18 0500 01/03/18 0500  Weight: 67 kg (147 lb 11.3 oz) 74.6 kg (164 lb 7.4 oz) 74.9 kg (165 lb 2 oz)    Intake/Output:    Intake/Output Summary (Last 24 hours) at 01/03/2018 1049 Last data filed at 01/03/2018 0956 Gross per 24 hour  Intake 2031.5 ml  Output 2250 ml  Net -218.5 ml     Physical Exam: General:  No acute distress  HEENT  anicteric, moist oral mucus membranes  Neck  supple   Pulm/lungs  scattered rhonchi, normal effort  CVS/Heart  tachycardic  Abdomen:   Soft, nontender  Extremities:  No peripheral edema  Neurologic:  Awake, alert, follows simple commands  Skin:  No acute rashes          Basic Metabolic Panel:  Recent Labs  Lab 12/29/17 0609  12/30/17 0511  12/31/17 0441  01/01/18 0501 01/01/18 1124  01/01/18 2355 01/02/18 0500 01/02/18 1246 01/02/18 1925 01/03/18 2224  NA 108*   < > 121*   < > 124*   < > 128* 128*   < > 130* 131*  130* 132* 135 134*  K 3.4*  --  3.0*  --  4.9  --  5.4* 5.0  --   --  5.1  --   --   --   CL <65*  --  74*  --  79*  --  79*  --   --   --  84*  --   --   --   CO2 35*  --  40*  --  38*  --  44*  --   --   --  40*  --   --   --   GLUCOSE 86  --  112*  --  79  --  81  --   --   --  105*  --   --   --   BUN 15  --  8  --  14  --  12  --   --   --  34*  --   --   --   CREATININE 0.39*  --  0.53  --   0.50  --  0.41*  --   --   --  0.47  --   --   --   CALCIUM 8.3*  --  8.9  --  8.8*  --  8.6*  --   --   --  8.1*  --   --   --   MG 1.1*  --  2.5*  --  2.0  --  1.7  --   --   --  2.0  --   --   --   PHOS 2.0*  --  3.0  --  2.1*  --  1.7*  --   --   --  1.7*  --   --   --    < > = values in this interval not displayed.     CBC: Recent Labs  Lab 12/28/17 1916 12/29/17 0609 12/30/17 0511 01/02/18 1246 01/02/18 1453 01/02/18 2224  WBC 12.6* 12.3* 9.4 11.8*  --   --   HGB 13.0 12.0 12.4 8.5* 8.2* 7.9*  HCT 35.4 33.6* 35.4 24.9* 24.2* 23.0*  MCV 86.5 86.9 90.1 91.3  --   --   PLT 332 312 347 327  --   --      No results found for: HEPBSAG, HEPBSAB, HEPBIGM    Microbiology:  Recent Results (from the past 240 hour(s))  MRSA PCR Screening     Status: None   Collection Time: 12/28/17 10:27 PM  Result Value Ref Range Status   MRSA by PCR NEGATIVE NEGATIVE Final    Comment:        The GeneXpert MRSA Assay (FDA approved for NASAL specimens only), is one component of a comprehensive MRSA colonization surveillance program. It is not intended to diagnose MRSA infection nor to guide or monitor treatment for MRSA infections. Performed at [facility], [address] [place], [place] [address]     Coagulation Studies: No results for input(s): LABPROT, INR in the last 72 hours.  Urinalysis: No results for input(s): COLORURINE, LABSPEC, PHURINE, GLUCOSEU, HGBUR, BILIRUBINUR, KETONESUR, PROTEINUR, UROBILINOGEN, NITRITE, LEUKOCYTESUR in the last 72 hours.  Invalid input(s): APPERANCEUR    Imaging: Dg Chest Port 1 View  Result Date: 01/03/2018 CLINICAL DATA:  Atelectasis. EXAM: PORTABLE CHEST 1 VIEW COMPARISON:  12/28/2017. FINDINGS: Mediastinum and hilar structures normal. Cardiomegaly with normal pulmonary vascularity. Mild left base subsegmental atelectasis. Small left pleural effusion. No pneumothorax. IMPRESSION: Mild left base subsegmental atelectasis. Small  left pleural effusion. Electronically Signed   By: Maisie Fus  Register   On: 01/03/2018 06:55     Medications:   . dextrose 5 % and 0.9% NaCl Stopped (01/03/18 1914)  . piperacillin-tazobactam (ZOSYN)  IV Stopped (01/03/18 0944)   . aspirin EC  81 mg Oral Daily  . docusate sodium  100 mg Oral BID  . feeding supplement (ENSURE ENLIVE)  237 mL Oral BID BM  . fluticasone furoate-vilanterol  1 puff Inhalation Daily  . hydrocortisone sod succinate (SOLU-CORTEF) inj  50 mg Intravenous Q12H  . multivitamin with minerals  1 tablet Oral Daily  . pantoprazole  40 mg Oral Daily  . tiotropium  18 mcg Inhalation Daily   acetaminophen **OR** acetaminophen, albuterol, ALPRAZolam, bisacodyl, hydrALAZINE, ondansetron **OR** ondansetron (ZOFRAN) IV, polyvinyl alcohol, traZODone  Assessment/ Plan:  77 y.o. female with Asthma, COPD, HTN was admitted on 12/28/2017 with Acute hyponatremia.    1.  Acute hyponatremia and hypokalemia Patient had normal sodium level of 136 on May 10  Hyponatremia seems to be secondary to overdiuresis in the setting of poor p.o. Intake  Initially 3% saline was used, now on NS.  Also has some element of adrenal insufficiency. -Serum sodium up to 135 today.  Continue your 0.9 normal saline  2.  Hypomagnesemia No new serum magnesium today.  Consider  3.  Urinary retention Appears to have improved with only catheter drainage.     LOS: 6 Dayana Dalporto 5/28/201910:49 AM  [facility] [place], [place] [phone]  Note: This note was prepared with Dragon dictation. Any transcription errors are unintentional

## 2018-01-04 LAB — CALCIUM, IONIZED: Calcium, Ionized, Serum: 4.7 mg/dL (ref 4.5–5.6)

## 2018-01-04 LAB — HEMOGLOBIN AND HEMATOCRIT, BLOOD
HCT: 26.9 % — ABNORMAL LOW (ref 35.0–47.0)
HCT: 29.1 % — ABNORMAL LOW (ref 35.0–47.0)
HCT: 29.5 % — ABNORMAL LOW (ref 35.0–47.0)
HEMATOCRIT: 28.8 % — AB (ref 35.0–47.0)
HEMOGLOBIN: 10.1 g/dL — AB (ref 12.0–16.0)
HEMOGLOBIN: 9 g/dL — AB (ref 12.0–16.0)
HEMOGLOBIN: 9.8 g/dL — AB (ref 12.0–16.0)
Hemoglobin: 10.1 g/dL — ABNORMAL LOW (ref 12.0–16.0)

## 2018-01-04 LAB — PHOSPHORUS: PHOSPHORUS: 3.4 mg/dL (ref 2.5–4.6)

## 2018-01-04 LAB — TYPE AND SCREEN
ABO/RH(D): B POS
ANTIBODY SCREEN: NEGATIVE
UNIT DIVISION: 0
Unit division: 0

## 2018-01-04 LAB — BASIC METABOLIC PANEL
ANION GAP: 7 (ref 5–15)
BUN: 24 mg/dL — ABNORMAL HIGH (ref 6–20)
CALCIUM: 8.6 mg/dL — AB (ref 8.9–10.3)
CO2: 38 mmol/L — AB (ref 22–32)
CREATININE: 0.59 mg/dL (ref 0.44–1.00)
Chloride: 92 mmol/L — ABNORMAL LOW (ref 101–111)
Glucose, Bld: 99 mg/dL (ref 65–99)
Potassium: 4.7 mmol/L (ref 3.5–5.1)
SODIUM: 137 mmol/L (ref 135–145)

## 2018-01-04 LAB — GLUCOSE, CAPILLARY
GLUCOSE-CAPILLARY: 101 mg/dL — AB (ref 65–99)
GLUCOSE-CAPILLARY: 178 mg/dL — AB (ref 65–99)
GLUCOSE-CAPILLARY: 182 mg/dL — AB (ref 65–99)
GLUCOSE-CAPILLARY: 78 mg/dL (ref 65–99)
GLUCOSE-CAPILLARY: 90 mg/dL (ref 65–99)
GLUCOSE-CAPILLARY: 94 mg/dL (ref 65–99)
Glucose-Capillary: 119 mg/dL — ABNORMAL HIGH (ref 65–99)

## 2018-01-04 LAB — BPAM RBC
BLOOD PRODUCT EXPIRATION DATE: 201906022359
Blood Product Expiration Date: 201906052359
ISSUE DATE / TIME: 201905280606
UNIT TYPE AND RH: 1700
Unit Type and Rh: 7300

## 2018-01-04 LAB — SODIUM
SODIUM: 136 mmol/L (ref 135–145)
Sodium: 136 mmol/L (ref 135–145)
Sodium: 137 mmol/L (ref 135–145)

## 2018-01-04 LAB — MAGNESIUM: MAGNESIUM: 1.8 mg/dL (ref 1.7–2.4)

## 2018-01-04 MED ORDER — BUDESONIDE 0.5 MG/2ML IN SUSP
0.5000 mg | Freq: Two times a day (BID) | RESPIRATORY_TRACT | Status: DC
  Administered 2018-01-04 – 2018-01-10 (×12): 0.5 mg via RESPIRATORY_TRACT
  Filled 2018-01-04 (×12): qty 2

## 2018-01-04 MED ORDER — MAGNESIUM OXIDE 400 (241.3 MG) MG PO TABS
400.0000 mg | ORAL_TABLET | Freq: Three times a day (TID) | ORAL | Status: AC
  Administered 2018-01-04 (×2): 400 mg via ORAL
  Filled 2018-01-04 (×2): qty 1

## 2018-01-04 MED ORDER — PREDNISONE 20 MG PO TABS
20.0000 mg | ORAL_TABLET | Freq: Every day | ORAL | Status: AC
  Administered 2018-01-05 – 2018-01-08 (×4): 20 mg via ORAL
  Filled 2018-01-04 (×2): qty 2
  Filled 2018-01-04: qty 1
  Filled 2018-01-04: qty 2

## 2018-01-04 NOTE — Progress Notes (Signed)
Pharmacy Electrolyte Monitoring Consult:  Pharmacy consulted to assist in monitoring and replacing electrolytes in this 77 y.o. female admitted on 12/28/2017 with hyponatremia secondary to over diuresis.    Labs:  Sodium (mmol/L)  Date Value  01/04/2018 137   Potassium (mmol/L)  Date Value  01/04/2018 4.7   Magnesium (mg/dL)  Date Value  16/05/9603 1.8   Phosphorus (mg/dL)  Date Value  54/04/8118 3.4   Calcium (mg/dL)  Date Value  14/78/2956 8.6 (L)   Albumin (g/dL)  Date Value  21/30/8657 4.1    Assessment/Plan: Magnesium trending down. Will order MagOX  BID x 2 doses. No other supplementation needed at this time. Will f/u AM labs.   Pharmacy will continue to monitor and adjust per consult.  Gardner Candle, PharmD, BCPS Clinical Pharmacist 01/04/2018 7:58 AM

## 2018-01-04 NOTE — Progress Notes (Signed)
Name: Georga Stys MRN: [id number] DOB: June 25, 1941     CONSULTATION DATE: 12/28/2017  Subjective & objectives: Patient was able to come off BiPAP for few hours, was noticed to have increased work of breathing was poor air movement and had to go back on BiPAP.  Hemoglobin has been stable.  PAST MEDICAL HISTORY :   has a past medical history of Asthma, COPD (chronic obstructive pulmonary disease) (HCC), and Hypertension.  has a past surgical history that includes Colon surgery and Abdominal hysterectomy. Prior to Admission medications   Medication Sig Start Date End Date Taking? Authorizing Provider  ADVAIR DISKUS 250-50 MCG/DOSE AEPB Take 1 puff 2 (two) times daily by mouth. 05/28/17  Yes [provider]  albuterol (PROVENTIL HFA;VENTOLIN HFA) 108 (90 Base) MCG/ACT inhaler Inhale 2 puffs every 6 (six) hours as needed into the lungs for wheezing or shortness of breath. 06/16/17  Yes Shaune Pollack, MD  ALPRAZolam Prudy Feeler) 0.25 MG tablet Take 0.25 mg by mouth 3 (three) times daily as needed for anxiety.   Yes [provider]  aspirin EC 81 MG EC tablet Take 1 tablet (81 mg total) by mouth daily. 12/20/17  Yes Salary, Evelena Asa, MD  carvedilol (COREG) 3.125 MG tablet Take 1 tablet (3.125 mg total) by mouth 2 (two) times daily with a meal. 12/19/17  Yes Salary, Montell D, MD  enalapril-hydrochlorothiazide (VASERETIC) 10-25 MG tablet Take 1 tablet by mouth daily. 12/19/17  Yes Salary, Evelena Asa, MD  furosemide (LASIX) 20 MG tablet Take 3 tablets (60 mg total) by mouth daily. 12/20/17  Yes Salary, Evelena Asa, MD  Multiple Vitamin (MULTIVITAMIN WITH MINERALS) TABS tablet Take 1 tablet by mouth daily. 12/20/17  Yes Salary, Evelena Asa, MD  predniSONE (DELTASONE) 10 MG tablet Take 4 tablets (40 mg total) by mouth daily with breakfast. 12/20/17  Yes Salary, Montell D, MD  tiotropium (SPIRIVA) 18 MCG inhalation capsule Place 1 capsule (18 mcg total) daily into inhaler and inhale. 06/18/17  Yes Shaune Pollack, MD  traZODone (DESYREL) 50 MG tablet Take 1 tablet (50 mg total) by mouth at bedtime as needed for sleep. 12/19/17  Yes Salary, Evelena Asa, MD  ALPRAZolam (XANAX) 0.5 MG tablet Take 1 tablet (0.5 mg total) by mouth 3 (three) times daily as needed for anxiety. 12/19/17   Salary, Evelena Asa, MD  feeding supplement, ENSURE ENLIVE, (ENSURE ENLIVE) LIQD Take 237 mLs by mouth 2 (two) times daily between meals. 12/19/17   Salary, Evelena Asa, MD   Allergies  Allergen Reactions  . Indomethacin   . Vicodin [Hydrocodone-Acetaminophen]     FAMILY HISTORY:  family history is not on file. SOCIAL HISTORY:  reports that she has quit smoking. Her smoking use included cigarettes. She has never used smokeless tobacco. She reports that she does not drink alcohol or use drugs.  REVIEW OF SYSTEMS:   Unable to obtain due to critical illness   VITAL SIGNS: Temp:  [98.3 F (36.8 C)-98.8 F (37.1 C)] 98.7 F (37.1 C) (05/29 0800) Pulse Rate:  [74-110] 99 (05/29 0800) Resp:  [12-45] 45 (05/29 0800) BP: (80-146)/(45-85) 129/76 (05/29 0800) SpO2:  [87 %-100 %] 98 % (05/29 0800) FiO2 (%):  [30 %] 30 % (05/29 0725) Weight:  [76.3 kg (168 lb 3.4 oz)] 76.3 kg (168 lb 3.4 oz) (05/29 0500)  Physical Examination:  Awake, oriented and nonfocal neuro exam  On nasal cannula 3 l/min, no distress, poor air entry  bilaterally and no adventitious sounds S1 + S2 audible  plus no murmur Benign abdomen withfeebleperistalsis, Wasted extremities with no edema  ASSESSMENT / PLAN:  Acute on chronic respiratory failure with baseline home O2 3-4 L/min nasal cannula -Optimize FiO2 for O2 sat 88to 92%,monitor work of breathing and PRN BiPAP -CODE STATUS DNR  COPDexacerbation -Bronchodilatorsand tapering systemic steroids, inhaled steroids and continue with empiric Zosyn  Atelectasis and pneumonia.  Left basilar airspace disease -Empiric Zosyn, monitor chest x-ray + CBC + FiO2 requirement.  Altered mental status  (improved) with metabolic encephalopathy secondary to hyponatremia. No acute intracranial abnormalities and CT head. -Optimize meds and monitor neuro status  Acute hyponatremia (improved) -Management as per renal  Acute urinary retention -Foley catheter in place  Laceration of left lower extremities status post surgical intervention -Wound care  Anemia  -Monitor H&H and keep hemoglobin more than 8 g/dL  GI bleeding with melena and occult blood in his stool positive -PPI and GI didn't advise invasive work up because of respiratory statis.  Echo 12/08/2017.  LVEF 60-65% and grade 1 diastolic dysfunction  DNR  DVT &GI prophylaxis. Continue with supportive care. Family was updated at the bedside with the plan of care  Critical care time 30 minutes

## 2018-01-04 NOTE — Progress Notes (Signed)
Candace Minium, MD [facility]   [address] [place], [place] [address] Phone: [phone] Fax : [phone]   Subjective: The patient hemoglobin is up from 9.0 yesterday to 9.8 today.  The patient is still in the ICU because of trouble breathing. I spoke to the patient about a workup for her GI bleed and anemia.  The patient was supposed to be transferred out of the unit yesterday but her breathing had not improved.   Objective: Vital signs in last 24 hours: Vitals:   01/04/18 0800 01/04/18 0900 01/04/18 1000 01/04/18 1100  BP: 129/76 113/66 (!) 95/59 126/68  Pulse: 99 (!) 118 95 89  Resp: (!) 45 (!) Temp: 98.7 F (37.1 C)     TempSrc: Oral     SpO2: 98% 97% 100% 99%  Weight:      Height:       Weight change: 3 lb 1.4 oz (1.4 kg)  Intake/Output Summary (Last 24 hours) at 01/04/2018 1222 Last data filed at 01/04/2018 0600 Gross per 24 hour  Intake 740 ml  Output 1105 ml  Net -365 ml     Exam: Heart:: Regular rate and rhythm, S1S2 present or without murmur or extra heart sounds Lungs: expiratory wheezes and rhonchi throughout both lung fields Abdomen: soft, nontender, normal bowel sounds   Lab Results: @ Micro Results: Recent Results (from the past 240 hour(s))  MRSA PCR Screening     Status: None   Collection Time: 12/28/17 10:27 PM  Result Value Ref Range Status   MRSA by PCR NEGATIVE NEGATIVE Final    Comment:        The GeneXpert MRSA Assay (FDA approved for NASAL specimens only), is one component of a comprehensive MRSA colonization surveillance program. It is not intended to diagnose MRSA infection nor to guide or monitor treatment for MRSA infections. Performed at [facility], [address] [place], [place] [address]    Studies/Results: Dg Chest Port 1 View  Result Date: 01/03/2018 CLINICAL DATA:  Atelectasis. EXAM: PORTABLE CHEST 1 VIEW COMPARISON:  12/28/2017. FINDINGS: Mediastinum and hilar structures normal.  Cardiomegaly with normal pulmonary vascularity. Mild left base subsegmental atelectasis. Small left pleural effusion. No pneumothorax. IMPRESSION: Mild left base subsegmental atelectasis. Small left pleural effusion. Electronically Signed   By: Maisie Fus  Register   On: 01/03/2018 06:55   Medications: I have reviewed the patient's current medications. Scheduled Meds: . aspirin EC  81 mg Oral Daily  . budesonide (PULMICORT) nebulizer solution  0.5 mg Nebulization BID  . docusate sodium  100 mg Oral BID  . feeding supplement (ENSURE ENLIVE)  237 mL Oral BID BM  . ipratropium-albuterol  3 mL Nebulization Q6H  . magnesium oxide  400 mg Oral Q8H  . multivitamin with minerals  1 tablet Oral Daily  . pantoprazole  40 mg Oral Daily  . [START ON 01/05/2018] predniSONE  20 mg Oral Q breakfast   Continuous Infusions: . dextrose 5 % and 0.9% NaCl 30 mL/hr at 01/04/18 0600  . piperacillin-tazobactam (ZOSYN)  IV Stopped (01/04/18 0919)   PRN Meds:.acetaminophen **OR** acetaminophen, albuterol, ALPRAZolam, bisacodyl, hydrALAZINE, ondansetron **OR** ondansetron (ZOFRAN) IV, polyvinyl alcohol, traZODone   Assessment: Active Problems:   Acute hyponatremia   Melena   Acute blood loss anemia    Plan: This patient is in the ICU for respiratory distress.  The patient has been informed of her low hemoglobin and black stools as a sign of GI bleeding.  The patient refuses  any endoscopic procedures now or even when her breathing is better.  She has been offered to do the procedures as an outpatient and she refuses that also. I will sign off.  Please call if any further GI concerns or questions.  We would like to thank you for the opportunity to participate in the care of Candace Butler.    LOS: 7 days   Candace Butler 01/04/2018, 12:22 PM

## 2018-01-05 ENCOUNTER — Inpatient Hospital Stay: Payer: Medicare HMO

## 2018-01-05 ENCOUNTER — Ambulatory Visit: Payer: Medicare HMO | Admitting: Family

## 2018-01-05 DIAGNOSIS — J9621 Acute and chronic respiratory failure with hypoxia: Secondary | ICD-10-CM

## 2018-01-05 LAB — CBC WITH DIFFERENTIAL/PLATELET
BASOS PCT: 0 %
Basophils Absolute: 0 10*3/uL (ref 0–0.1)
EOS PCT: 1 %
Eosinophils Absolute: 0.1 10*3/uL (ref 0–0.7)
HEMATOCRIT: 29.2 % — AB (ref 35.0–47.0)
Hemoglobin: 9.9 g/dL — ABNORMAL LOW (ref 12.0–16.0)
Lymphocytes Relative: 13 %
Lymphs Abs: 1.3 10*3/uL (ref 1.0–3.6)
MCH: 31.9 pg (ref 26.0–34.0)
MCHC: 33.9 g/dL (ref 32.0–36.0)
MCV: 94.2 fL (ref 80.0–100.0)
MONO ABS: 0.7 10*3/uL (ref 0.2–0.9)
MONOS PCT: 7 %
NEUTROS ABS: 8.1 10*3/uL — AB (ref 1.4–6.5)
Neutrophils Relative %: 79 %
Platelets: 326 10*3/uL (ref 150–440)
RBC: 3.1 MIL/uL — ABNORMAL LOW (ref 3.80–5.20)
RDW: 14.9 % — AB (ref 11.5–14.5)
WBC: 10.3 10*3/uL (ref 3.6–11.0)

## 2018-01-05 LAB — BASIC METABOLIC PANEL
ANION GAP: 6 (ref 5–15)
BUN: 23 mg/dL — AB (ref 6–20)
CALCIUM: 8.9 mg/dL (ref 8.9–10.3)
CO2: 39 mmol/L — ABNORMAL HIGH (ref 22–32)
CREATININE: 0.54 mg/dL (ref 0.44–1.00)
Chloride: 94 mmol/L — ABNORMAL LOW (ref 101–111)
GFR calc Af Amer: >60 mL/min (ref >60)
GFR calc non Af Amer: >60 mL/min (ref >60)
GLUCOSE: 111 mg/dL — AB (ref 65–99)
Potassium: 4.8 mmol/L (ref 3.5–5.1)
Sodium: 139 mmol/L (ref 135–145)

## 2018-01-05 LAB — SODIUM
SODIUM: 139 mmol/L (ref 135–145)
SODIUM: 139 mmol/L (ref 135–145)
Sodium: 135 mmol/L (ref 135–145)

## 2018-01-05 LAB — HEMOGLOBIN AND HEMATOCRIT, BLOOD
HCT: 28.6 % — ABNORMAL LOW (ref 35.0–47.0)
HEMATOCRIT: 27.2 % — AB (ref 35.0–47.0)
HEMOGLOBIN: 9.1 g/dL — AB (ref 12.0–16.0)
Hemoglobin: 10.1 g/dL — ABNORMAL LOW (ref 12.0–16.0)

## 2018-01-05 LAB — GLUCOSE, CAPILLARY
GLUCOSE-CAPILLARY: 78 mg/dL (ref 65–99)
GLUCOSE-CAPILLARY: 120 mg/dL — AB (ref 65–99)
GLUCOSE-CAPILLARY: 111 mg/dL — AB (ref 65–99)
Glucose-Capillary: 141 mg/dL — ABNORMAL HIGH (ref 65–99)

## 2018-01-05 LAB — MAGNESIUM: Magnesium: 1.9 mg/dL (ref 1.7–2.4)

## 2018-01-05 LAB — PHOSPHORUS: Phosphorus: 3.6 mg/dL (ref 2.5–4.6)

## 2018-01-05 MED ORDER — SODIUM CHLORIDE 0.9 % IV SOLN
3.0000 g | Freq: Four times a day (QID) | INTRAVENOUS | Status: DC
  Administered 2018-01-05 – 2018-01-08 (×11): 3 g via INTRAVENOUS
  Filled 2018-01-05 (×13): qty 3

## 2018-01-05 MED ORDER — ALPRAZOLAM 0.25 MG PO TABS
0.2500 mg | ORAL_TABLET | Freq: Once | ORAL | Status: AC
  Administered 2018-01-05: 0.25 mg via ORAL
  Filled 2018-01-05: qty 1

## 2018-01-05 MED ORDER — ALPRAZOLAM 0.25 MG PO TABS
0.2500 mg | ORAL_TABLET | Freq: Three times a day (TID) | ORAL | Status: DC | PRN
  Administered 2018-01-05: 0.25 mg via ORAL

## 2018-01-05 MED ORDER — ALPRAZOLAM 0.5 MG PO TABS
0.5000 mg | ORAL_TABLET | Freq: Three times a day (TID) | ORAL | Status: DC | PRN
  Administered 2018-01-05 – 2018-01-06 (×2): 0.5 mg via ORAL
  Filled 2018-01-05 (×2): qty 1

## 2018-01-05 MED ORDER — MAGNESIUM OXIDE 400 (241.3 MG) MG PO TABS
400.0000 mg | ORAL_TABLET | Freq: Two times a day (BID) | ORAL | Status: DC
  Administered 2018-01-05 – 2018-01-08 (×6): 400 mg via ORAL
  Filled 2018-01-05 (×7): qty 1

## 2018-01-05 NOTE — Progress Notes (Signed)
RN called and spoke with Dr. Duanne Limerick and made MD aware that patient is asking for xanax and feels anxious.  RN made MD aware that patient takes 0.5 mg of xanax PRN TID and that 0.25 mg of xanax is ordered PRN TID in the hospital.  Dr. Duanne Limerick stated "give her 0.25 mg xanax PO now and then change PRN order to 0.5 mg of xanax."

## 2018-01-05 NOTE — Progress Notes (Signed)
   01/05/18 1400  Clinical Encounter Type  Visited With Patient and family together  Visit Type Initial  Spiritual Encounters  Spiritual Needs Emotional   Chaplain made introductory visit to patient and visitor.  They reported that patient's pastor had been to visit this morning.  Chaplain reviewed scope of chaplain's practice and encouraged patient/visitor to have chaplain paged if needed.

## 2018-01-05 NOTE — Progress Notes (Signed)
Pharmacy Electrolyte Monitoring Consult:  Pharmacy consulted to assist in monitoring and replacing electrolytes in this 77 y.o. female admitted on 12/28/2017 with hyponatremia secondary to over diuresis.    Labs:  Sodium (mmol/L)  Date Value  01/05/2018 139   Potassium (mmol/L)  Date Value  01/05/2018 4.8   Magnesium (mg/dL)  Date Value  02/72/5366 1.9   Phosphorus (mg/dL)  Date Value  44/10/4740 3.6   Calcium (mg/dL)  Date Value  59/56/3875 8.9   Albumin (g/dL)  Date Value  64/33/2951 4.1    Assessment/Plan: Will continue MagOX 400 mg PO BID. No other supplementation needed at this time.  Will f/u with 6/1 AM labs.   Pharmacy will continue to monitor and adjust per consult.  Mirna Mires,  Pharmacy Student 01/05/2018 2:30 PM

## 2018-01-05 NOTE — Progress Notes (Signed)
Resting at this time.  Tolerating high flow well.  Daughters visited during shift and very attentive.

## 2018-01-05 NOTE — Progress Notes (Signed)
Physical Therapy Treatment Patient Details Name: Candace Butler MRN: [id number] DOB: [birth year] Today's Date: 01/05/2018    History of Present Illness Candace Butler is a 77yo white female who comes to [facility] after a few days weakness, AMS, hallucination, and eventually a fall from [facility] sustaining 2 skin injuries to LLE. Pt found to have severe HypoNa+. Pt comes in from [facility] where she had been in STR working with PT. Daughter reports the patient's progress was slow, stil not safe for return to home yet.     PT Comments    Pt on nasal cannula upon PT arrival.  Nursing cleared PT to work with pt.  With pacing, pt only able to tolerate performing AROM ankle pumps B, AROM quads sets B, AAROM heelslides B, and B heelcord PROM/stretching.  With activity pt reporting increasing feeling of SOB so activity stopped and pt requiring a few minutes rest break before feeling like her breathing was improving and pt feeling more comfortable with respiratory status.  Pt declined further activity/ex's d/t breathing concerns with ex's.  Nursing notified of above as well as pt c/o 5/10 abdominal/stomache pain and request for pain medications.  Pt also demonstrating impaired B DF ROM (grossly 10 degrees short of neutral on L and grossly 30 degrees short of neutral on R); nursing notified of impaired ROM and pt would benefit from Prevalon boots.  Will continue to progress pt with ex's/activity per pt tolerance.    Follow Up Recommendations  SNF     Equipment Recommendations  Rolling walker with 5" wheels    Recommendations for Other Services       Precautions / Restrictions Precautions Precautions: Fall Precaution Comments: monitor O2, pulse Restrictions Weight Bearing Restrictions: No    Mobility  Bed Mobility               General bed mobility comments: Deferred d/t SOB with minimal ex's in bed  Transfers                 General transfer comment: Deferred d/t SOB with minimal ex's in  bed  Ambulation/Gait             General Gait Details: Deferred d/t SOB with minimal ex's in bed   Stairs             Wheelchair Mobility    Modified Rankin (Stroke Patients Only)       Balance                                            Cognition Arousal/Alertness: Awake/alert Behavior During Therapy: WFL for tasks assessed/performed Overall Cognitive Status: Within Functional Limits for tasks assessed                                        Exercises Total Joint Exercises Ankle Circles/Pumps: AROM;Strengthening;Both;10 reps;Supine(HOB elevated) Quad Sets: AROM;Strengthening;Both;10 reps;Supine;Other (comment)(Fair quad contraction; HOB elevated) Heel Slides: AAROM;Strengthening;Both;10 reps;Supine;Other (comment)(HOB elevated) Other Exercises Other Exercises: B heelcord stretching 30 seconds x2    General Comments General comments (skin integrity, edema, etc.): Pt resting in bed on nasal cannula upon PT entry.   Pt's daughter came beginning of session.  Nursing cleared pt for participation in physical therapy.  Pt agreeable to PT session.  Pertinent Vitals/Pain Pain Assessment: 0-10 Pain Score: 5  Pain Location: Abdomen Pain Descriptors / Indicators: Aching Pain Intervention(s): Limited activity within patient's tolerance;Monitored during session;Repositioned;Patient requesting pain meds-RN notified  HR 106-120 bpm and O2 97% or greater on nasal cannula during session.      Home Living                      Prior Function            PT Goals (current goals can now be found in the care plan section) Acute Rehab PT Goals Patient Stated Goal: to improve breathing PT Goal Formulation: With patient Time For Goal Achievement: 01/19/18 Potential to Achieve Goals: Fair Progress towards PT goals: Progressing toward goals    Frequency    Min 2X/week      PT Plan Current plan remains appropriate     Co-evaluation              AM-PAC PT "6 Clicks" Daily Activity  Outcome Measure  Difficulty turning over in bed (including adjusting bedclothes, sheets and blankets)?: Unable Difficulty moving from lying on back to sitting on the side of the bed? : Unable Difficulty sitting down on and standing up from a chair with arms (e.g., wheelchair, bedside commode, etc,.)?: Unable Help needed moving to and from a bed to chair (including a wheelchair)?: Total Help needed walking in hospital room?: Total Help needed climbing 3-5 steps with a railing? : Total 6 Click Score: 6    End of Session Equipment Utilized During Treatment: Oxygen(nasal cannula) Activity Tolerance: Patient limited by fatigue Patient left: in bed;with call bell/phone within reach;with bed alarm set;with family/visitor present Nurse Communication: Patient requests pain meds;Precautions;Other (comment)(Limited tolerance to ex's d/t respiratory (SOB)) PT Visit Diagnosis: Unsteadiness on feet (R26.81);Muscle weakness (generalized) (M62.81);Difficulty in walking, not elsewhere classified (R26.2)     Time: 0981-1914 PT Time Calculation (min) (ACUTE ONLY): 23 min  Charges:  $Therapeutic Exercise: 23-37 mins                    G CodesHendricks Butler, PT 01/05/18, 10:23 AM [phone]

## 2018-01-05 NOTE — Progress Notes (Signed)
OT Cancellation Note  Patient Details Name: Candace Butler MRN: [id number] DOB: [birth year]   Cancelled Treatment:    Reason Eval/Treat Not Completed: Fatigue/lethargy limiting ability to participate.  Pt only able to participate with PT briefly today and now c/o feeling anxious.  OT to hold therapy today and re-assess tomorrow for participation in therapy.    Susanne Borders, OTR/L ascom [phone] 01/05/18, 3:02 PM

## 2018-01-05 NOTE — Progress Notes (Signed)
RN made Dr. Duanne Limerick aware that patient is asking for her nerve pill and it's only ordered BID and per home med list it's ordered TID.  MD gave order to change xanax order to PRN TID.

## 2018-01-05 NOTE — Progress Notes (Signed)
Name: Candace Butler MRN: [id number] DOB: Mar 14, 1941     CONSULTATION DATE: 12/28/2017  Subjective & objective: Improved respiratory status and tolerating nasal cannula.  Complaining of anxiety.  PAST MEDICAL HISTORY :   has a past medical history of Asthma, COPD (chronic obstructive pulmonary disease) (HCC), and Hypertension.  has a past surgical history that includes Colon surgery and Abdominal hysterectomy. Prior to Admission medications   Medication Sig Start Date End Date Taking? Authorizing Provider  ADVAIR DISKUS 250-50 MCG/DOSE AEPB Take 1 puff 2 (two) times daily by mouth. 05/28/17  Yes [provider]  albuterol (PROVENTIL HFA;VENTOLIN HFA) 108 (90 Base) MCG/ACT inhaler Inhale 2 puffs every 6 (six) hours as needed into the lungs for wheezing or shortness of breath. 06/16/17  Yes Shaune Pollack, MD  ALPRAZolam Prudy Feeler) 0.25 MG tablet Take 0.25 mg by mouth 3 (three) times daily as needed for anxiety.   Yes [provider]  aspirin EC 81 MG EC tablet Take 1 tablet (81 mg total) by mouth daily. 12/20/17  Yes Salary, Evelena Asa, MD  carvedilol (COREG) 3.125 MG tablet Take 1 tablet (3.125 mg total) by mouth 2 (two) times daily with a meal. 12/19/17  Yes Salary, Montell D, MD  enalapril-hydrochlorothiazide (VASERETIC) 10-25 MG tablet Take 1 tablet by mouth daily. 12/19/17  Yes Salary, Evelena Asa, MD  furosemide (LASIX) 20 MG tablet Take 3 tablets (60 mg total) by mouth daily. 12/20/17  Yes Salary, Evelena Asa, MD  Multiple Vitamin (MULTIVITAMIN WITH MINERALS) TABS tablet Take 1 tablet by mouth daily. 12/20/17  Yes Salary, Evelena Asa, MD  predniSONE (DELTASONE) 10 MG tablet Take 4 tablets (40 mg total) by mouth daily with breakfast. 12/20/17  Yes Salary, Montell D, MD  tiotropium (SPIRIVA) 18 MCG inhalation capsule Place 1 capsule (18 mcg total) daily into inhaler and inhale. 06/18/17  Yes Shaune Pollack, MD  traZODone (DESYREL) 50 MG tablet Take 1 tablet (50 mg total) by mouth at bedtime as  needed for sleep. 12/19/17  Yes Salary, Evelena Asa, MD  ALPRAZolam (XANAX) 0.5 MG tablet Take 1 tablet (0.5 mg total) by mouth 3 (three) times daily as needed for anxiety. 12/19/17   Salary, Evelena Asa, MD  feeding supplement, ENSURE ENLIVE, (ENSURE ENLIVE) LIQD Take 237 mLs by mouth 2 (two) times daily between meals. 12/19/17   Salary, Evelena Asa, MD   Allergies  Allergen Reactions  . Indomethacin   . Vicodin [Hydrocodone-Acetaminophen]     FAMILY HISTORY:  family history is not on file. SOCIAL HISTORY:  reports that she has quit smoking. Her smoking use included cigarettes. She has never used smokeless tobacco. She reports that she does not drink alcohol or use drugs.  REVIEW OF SYSTEMS:   Unable to obtain due to critical illness   VITAL SIGNS: Temp:  [98.4 F (36.9 C)-98.8 F (37.1 C)] 98.4 F (36.9 C) (05/30 1230) Pulse Rate:  [77-113] 101 (05/30 1400) Resp:  [15-53] 21 (05/30 1600) BP: (83-151)/(49-86) 93/61 (05/30 1600) SpO2:  [95 %-100 %] 99 % (05/30 1600) FiO2 (%):  [30 %-35 %] 35 % (05/30 1439)  Physical Examination:  Awake,oriented and nonfocal neuro exam  On nasal cannula 3 l/min, no distress, poor air entry bilaterally and no adventitious sounds S1 + S2 audible plus no murmur Benign abdomen withfeebleperistalsis, Wasted extremities with no edema  ASSESSMENT / PLAN:  Acute on chronic respiratory failure with baseline home O2 3-4L/min nasal cannula.  Improved respiratory status tolerating nasal cannula most of the day, required  BiPAP at night and as needed -Optimize FiO2 for O2 sat 88to 92%,monitor work of breathing and PRN BiPAP -CODE STATUS DNR  COPDexacerbation -Bronchodilatorsand tapering systemic steroids, inhaled steroids and continue with empiric Zosyn  Atelectasis and pneumonia. improved Left basilar airspace disease. MRSA PCR -ve. -Empiric Zosyn, monitor chest x-ray + CBC + FiO2 requirement.  Altered mental status (improved) with metabolic  encephalopathy secondary to hyponatremia. No acute intracranial abnormalities and CT head. -Optimize meds and monitor neuro status  Acute hyponatremia (improved) -Management as per renal  Acute urinary retention -Foley catheter in place  Anxiety -Optimize Xanax.  Laceration of left lower extremities status post surgical intervention -Wound care  Anemia  -Monitor H&H and keep hemoglobin more than 8 g/dL  GI bleeding withmelena and occult blood in his stool positive -PPI and patient refused colonoscope.  Echo 12/08/2017. LVEF 60-65% and grade 1 diastolic dysfunction  DNR  DVT &GI prophylaxis. Continue with supportive care.   Critical care time30 minutes

## 2018-01-05 NOTE — Clinical Social Work Note (Signed)
No changes at this time to discharge plan.Patient to go to [facility]. York Spaniel MSW,LCSW [phone]

## 2018-01-06 LAB — CBC WITH DIFFERENTIAL/PLATELET
BASOS PCT: 0 %
Basophils Absolute: 0 10*3/uL (ref 0–0.1)
Eosinophils Absolute: 0.1 10*3/uL (ref 0–0.7)
Eosinophils Relative: 1 %
HEMATOCRIT: 27.8 % — AB (ref 35.0–47.0)
Hemoglobin: 9.6 g/dL — ABNORMAL LOW (ref 12.0–16.0)
LYMPHS ABS: 1.2 10*3/uL (ref 1.0–3.6)
Lymphocytes Relative: 12 %
MCH: 33 pg (ref 26.0–34.0)
MCHC: 34.4 g/dL (ref 32.0–36.0)
MCV: 95.8 fL (ref 80.0–100.0)
MONO ABS: 0.7 10*3/uL (ref 0.2–0.9)
MONOS PCT: 7 %
NEUTROS ABS: 8.1 10*3/uL — AB (ref 1.4–6.5)
Neutrophils Relative %: 80 %
Platelets: 292 10*3/uL (ref 150–440)
RBC: 2.9 MIL/uL — ABNORMAL LOW (ref 3.80–5.20)
RDW: 14.5 % (ref 11.5–14.5)
WBC: 10.2 10*3/uL (ref 3.6–11.0)

## 2018-01-06 LAB — GLUCOSE, CAPILLARY
GLUCOSE-CAPILLARY: 120 mg/dL — AB (ref 65–99)
GLUCOSE-CAPILLARY: 135 mg/dL — AB (ref 65–99)
Glucose-Capillary: 100 mg/dL — ABNORMAL HIGH (ref 65–99)
Glucose-Capillary: 153 mg/dL — ABNORMAL HIGH (ref 65–99)
Glucose-Capillary: 82 mg/dL (ref 65–99)
Glucose-Capillary: 86 mg/dL (ref 65–99)

## 2018-01-06 LAB — CALCIUM, IONIZED: Calcium, Ionized, Serum: 5 mg/dL (ref 4.5–5.6)

## 2018-01-06 LAB — BASIC METABOLIC PANEL
Anion gap: 6 (ref 5–15)
BUN: 19 mg/dL (ref 6–20)
CALCIUM: 8.6 mg/dL — AB (ref 8.9–10.3)
CHLORIDE: 91 mmol/L — AB (ref 101–111)
CO2: 40 mmol/L — AB (ref 22–32)
CREATININE: 0.5 mg/dL (ref 0.44–1.00)
GFR calc non Af Amer: >60 mL/min (ref >60)
GLUCOSE: 89 mg/dL (ref 65–99)
Potassium: 4.7 mmol/L (ref 3.5–5.1)
Sodium: 137 mmol/L (ref 135–145)

## 2018-01-06 MED ORDER — ALPRAZOLAM 0.5 MG PO TABS
0.5000 mg | ORAL_TABLET | Freq: Four times a day (QID) | ORAL | Status: DC | PRN
  Administered 2018-01-06 – 2018-01-10 (×11): 0.5 mg via ORAL
  Filled 2018-01-06 (×11): qty 1

## 2018-01-06 NOTE — Progress Notes (Signed)
Occupational Therapy Treatment Patient Details Name: Candace Butler MRN: [id number] DOB: 1941/04/28 Today's Date: 01/06/2018    History of present illness Candace Butler is a 77yo white female who [name] after a few days weakness, AMS, hallucination, and [facility] 2 skin injuries to LLE. Pt found to have severe HypoNa+. Pt comes in from [facility] where she had been in STR working with PT. Daughter reports the patient's progress was slow, stil not safe for return to home yet.    OT comments  Spoke to patient's daughter before she left and was concerned about her mother not making progress with her breathing and is scared that she will not be able to catch her breath even though she is on respiratory support via high flow nasal cannula.  Pt was agreeable to OT session but required frequent rest breaks for hand to mouth exercises and reviewed deep breathing exercises to help increase O2 sats from 94-97%.  One set of 10 hand to mouth exercises increased HR to 130 and session stopped.  Back to baseline in 3 minutes with cues for breathing.  Encouraged pt to try and feed herself at dinner to work on hand to mouth ADLs.    Follow Up Recommendations  SNF    Equipment Recommendations       Recommendations for Other Services      Precautions / Restrictions Precautions Precautions: Fall Precaution Comments: monitor O2, pulse Restrictions Weight Bearing Restrictions: No       Mobility Bed Mobility                  Transfers                      Balance                                           ADL either performed or assessed with clinical judgement   ADL Overall ADL's : Needs assistance/impaired                                       General ADL Comments: Pt continues to be anxious and fearfull that she will not be able to catch her breath.  Educated in deep breathing exercises and progressive  relaxation exercises which improved O2 sat from 94% to 97%.  HR fluctuated from 120-98 and needed frequent rest breaks for simple hand to mouth tasks lying in bed.  Pt only able to bring R hand to face 3 times before needing a rest break. Cues for breathing properly. Completed 1 set of 10 per arm and then HR started to increase to 130 and exercises stopped.     Vision Patient Visual Report: No change from baseline     Perception     Praxis      Cognition Arousal/Alertness: Awake/alert Behavior During Therapy: WFL for tasks assessed/performed;Anxious;Flat affect Overall Cognitive Status: Within Functional Limits for tasks assessed                                          Exercises     Shoulder Instructions       General Comments  Pertinent Vitals/ Pain       Pain Assessment: No/denies pain  Home Living                                          Prior Functioning/Environment              Frequency  Min 2X/week(may need to be decreased if endurance continues to be poor )        Progress Toward Goals  OT Goals(current goals can now be found in the care plan section)  Progress towards OT goals: Progressing toward goals(progress is slow but pt is able to participate)  Acute Rehab OT Goals Patient Stated Goal: to improve breathing OT Goal Formulation: With patient  Plan Discharge plan remains appropriate    Co-evaluation                 AM-PAC PT "6 Clicks" Daily Activity     Outcome Measure   Help from another person eating meals?: A Lot Help from another person taking care of personal grooming?: A Lot Help from another person toileting, which includes using toliet, bedpan, or urinal?: Total Help from another person bathing (including washing, rinsing, drying)?: Total Help from another person to put on and taking off regular upper body clothing?: Total Help from another person to put on and taking off regular  lower body clothing?: Total 6 Click Score: 8    End of Session    OT Visit Diagnosis: Muscle weakness (generalized) (M62.81);Unsteadiness on feet (R26.81)   Activity Tolerance Patient limited by fatigue   Patient Left in bed;with call bell/phone within reach;with bed alarm set   Nurse Communication          Time: 1325-1350 OT Time Calculation (min): 25 min  Charges: OT General Charges $OT Visit: 1 Visit OT Treatments $Therapeutic Activity: 23-37 mins  Susanne Borders, OTR/L ascom [phone] 01/06/18, 1:59 PM

## 2018-01-06 NOTE — Progress Notes (Signed)
Dr. Cherylann RatelLateef present and gave order to stop d5 NS.

## 2018-01-06 NOTE — Progress Notes (Signed)
[facility] [place] [facility]   PATIENT NAME: Candace GibbonGlenda Butler    MR#:  [id number]  DATE OF BIRTH:  03/11/1941  SUBJECTIVE:  CHIEF COMPLAINT:   Chief Complaint  Patient presents with  . Fall   Came with weakness, was here 2 weeks ago, Was discharged on oral Lasix and hydrochlorothiazide, came with generalized weakness and noted to have severe hyponatremia. Hyponatremia improved, Mental status improved after bipap use.  Cont to require intermittent bipap. Hb dropped.  REVIEW OF SYSTEMS:  CONSTITUTIONAL: No fever, severe fatigue or weakness.  EYES: No blurred or double vision.  EARS, NOSE, AND THROAT: No tinnitus or ear pain.  RESPIRATORY: No cough, shortness of breath, wheezing or hemoptysis.  CARDIOVASCULAR: No chest pain, orthopnea, edema.  GASTROINTESTINAL: No nausea, vomiting, diarrhea or abdominal pain.  GENITOURINARY: No dysuria, hematuria.  ENDOCRINE: No polyuria, nocturia,  HEMATOLOGY: No anemia, easy bruising or bleeding SKIN: No rash or lesion. MUSCULOSKELETAL: No joint pain or arthritis.   NEUROLOGIC: No tingling, numbness, weakness.  PSYCHIATRY: No anxiety or depression.   ROS  DRUG ALLERGIES:   Allergies  Allergen Reactions  . Indomethacin   . Vicodin [Hydrocodone-Acetaminophen]     VITALS:  Blood pressure (!) 94/58, pulse (!) 103, temperature 98.4 F (36.9 C), temperature source Oral, resp. rate (!) 34, height 5\' 2"  (1.575 m), weight 76.3 kg (168 lb 3.4 oz), SpO2 98 %.  PHYSICAL EXAMINATION:  GENERAL:  77 y.o.-year-old patient lying in the bed with no acute distress.  EYES: Pupils equal, round, reactive to light and accommodation. No scleral icterus. Extraocular muscles intact.  HEENT: Head atraumatic, normocephalic. Oropharynx and nasopharynx clear.  NECK:  Supple, no jugular venous distention. No thyroid enlargement, no tenderness.  LUNGS: Normal breath sounds bilaterally, no wheezing, rales,rhonchi or crepitation. No use of  accessory muscles of respiration. Nasal canula in use. CARDIOVASCULAR: S1, S2 normal. No murmurs, rubs, or gallops.  ABDOMEN: Soft, nontender, nondistended. Bowel sounds present. No organomegaly or mass.  EXTREMITIES: No pedal edema, cyanosis, or clubbing.  NEUROLOGIC:  follows simple commands. Generalized weakness. No tremors. PSYCHIATRIC: The patient is alert and oriented.  SKIN: No obvious rash, lesion, or ulcer.   Physical Exam LABORATORY PANEL:   CBC Recent Labs  Lab 01/06/18 0637  WBC 10.2  HGB 9.6*  HCT 27.8*  PLT 292   ------------------------------------------------------------------------------------------------------------------  Chemistries  Recent Labs  Lab 01/05/18 0511  01/06/18 0637  NA 139   < > 137  K 4.8  --  4.7  CL 94*  --  91*  CO2 39*  --  40*  GLUCOSE 111*  --  89  BUN 23*  --  19  CREATININE 0.54  --  0.50  CALCIUM 8.9  --  8.6*  MG 1.9  --   --    < > = values in this interval not displayed.   ------------------------------------------------------------------------------------------------------------------  Cardiac Enzymes No results for input(s): TROPONINI in the last 168 hours. ------------------------------------------------------------------------------------------------------------------  RADIOLOGY:  Dg Chest Port 1 View  Result Date: 01/05/2018 CLINICAL DATA:  Atelectasis. EXAM: PORTABLE CHEST 1 VIEW COMPARISON:  Radiographs of Jan 03, 2018. FINDINGS: Stable cardiomediastinal silhouette. No pneumothorax is noted. Atherosclerosis of thoracic aorta is noted. Right lung is clear. Minimal left basilar subsegmental atelectasis and small left pleural effusion may be present. The visualized skeletal structures are unremarkable. IMPRESSION: Minimal left basilar subsegmental atelectasis with small left pleural effusion. Aortic Atherosclerosis (ICD10-I70.0). Electronically Signed   By: Lupita RaiderJames  Green Jr, M.D.  On: 01/05/2018 08:34    ASSESSMENT  AND PLAN:   Active Problems:   Acute hyponatremia   Melena   Acute blood loss anemia  1.  Acute severe hyponatremia,   hypertonic saline.  Patient looks clinically volume depleted.   monitor intensive care unit for close observation and management.    check sodium level frequently.  We will hold hydrochlorothiazide and Lasix on discharge.  Appreciated help by nephrology. Continue IV fluid.  sodium much improved now. 2.  Acute metabolic encephalopathy, likely related to hyponatremia.  also have hypercapnea   continue to monitor clinically closely, while treating the underlying disorder.   requiring bipap for respi failure.   Improved on nasal canula oxygen.   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 3.  Left lower extremity laceration, status post repair in the emergency room. 4.  COPD, stable.  Continue maintenance medications. 5.  Hypokalemia, will replace K per protocol.   As now potassium is rising, we will stop IV replacement. 6. Generalized weakness- need PT eval.   Need SNF. 7. Ac respi failure with hypercapneia   bipap in use, DNR>    Improved now. 8. GI bleed with guiac positive stool   GI consult.   Monitor Hb.  All the records are reviewed and case discussed with Care Management/Social Workerr. Management plans discussed with the patient, family and they are in agreement.  CODE STATUS: DNR.  TOTAL TIME TAKING CARE OF THIS PATIENT: 35 minutes.    POSSIBLE D/C IN 1-2 DAYS, DEPENDING ON CLINICAL CONDITION.   Altamese Dilling M.D on 01/06/2018   Between 7am to 6pm - Pager - [phone]  After 6pm go to [email or web address] - password [employer]  [facility]  Office  [phone]  CC: Primary care physician; Leotis Shames, MD  Note: This dictation was prepared with Dragon dictation along with smaller phrase technology. Any transcriptional errors that result from this process are unintentional.

## 2018-01-06 NOTE — Progress Notes (Signed)
Name: Candace Butler MRN: [id number] DOB: April 10, 1941     CONSULTATION DATE: 12/28/2017 Subjective & objective: Patient remains on high flow nasal cannula, required BiPAP last evening for couple of hours however stayed on night on high flow nasal cannula.  She feels better today, does not seem anxious as yesterday and no complaints.  PAST MEDICAL HISTORY :   has a past medical history of Asthma, COPD (chronic obstructive pulmonary disease) (HCC), and Hypertension.  has a past surgical history that includes Colon surgery and Abdominal hysterectomy. Prior to Admission medications   Medication Sig Start Date End Date Taking? Authorizing Provider  ADVAIR DISKUS 250-50 MCG/DOSE AEPB Take 1 puff 2 (two) times daily by mouth. 05/28/17  Yes [provider]  albuterol (PROVENTIL HFA;VENTOLIN HFA) 108 (90 Base) MCG/ACT inhaler Inhale 2 puffs every 6 (six) hours as needed into the lungs for wheezing or shortness of breath. 06/16/17  Yes Shaune Pollack, MD  ALPRAZolam Prudy Feeler) 0.25 MG tablet Take 0.25 mg by mouth 3 (three) times daily as needed for anxiety.   Yes [provider]  aspirin EC 81 MG EC tablet Take 1 tablet (81 mg total) by mouth daily. 12/20/17  Yes Salary, Evelena Asa, MD  carvedilol (COREG) 3.125 MG tablet Take 1 tablet (3.125 mg total) by mouth 2 (two) times daily with a meal. 12/19/17  Yes Salary, Montell D, MD  enalapril-hydrochlorothiazide (VASERETIC) 10-25 MG tablet Take 1 tablet by mouth daily. 12/19/17  Yes Salary, Evelena Asa, MD  furosemide (LASIX) 20 MG tablet Take 3 tablets (60 mg total) by mouth daily. 12/20/17  Yes Salary, Evelena Asa, MD  Multiple Vitamin (MULTIVITAMIN WITH MINERALS) TABS tablet Take 1 tablet by mouth daily. 12/20/17  Yes Salary, Evelena Asa, MD  predniSONE (DELTASONE) 10 MG tablet Take 4 tablets (40 mg total) by mouth daily with breakfast. 12/20/17  Yes Salary, Montell D, MD  tiotropium (SPIRIVA) 18 MCG inhalation capsule Place 1 capsule (18 mcg total) daily  into inhaler and inhale. 06/18/17  Yes Shaune Pollack, MD  traZODone (DESYREL) 50 MG tablet Take 1 tablet (50 mg total) by mouth at bedtime as needed for sleep. 12/19/17  Yes Salary, Evelena Asa, MD  ALPRAZolam (XANAX) 0.5 MG tablet Take 1 tablet (0.5 mg total) by mouth 3 (three) times daily as needed for anxiety. 12/19/17   Salary, Evelena Asa, MD  feeding supplement, ENSURE ENLIVE, (ENSURE ENLIVE) LIQD Take 237 mLs by mouth 2 (two) times daily between meals. 12/19/17   Salary, Evelena Asa, MD   Allergies  Allergen Reactions  . Indomethacin   . Vicodin [Hydrocodone-Acetaminophen]     FAMILY HISTORY:  family history is not on file. SOCIAL HISTORY:  reports that she has quit smoking. Her smoking use included cigarettes. She has never used smokeless tobacco. She reports that she does not drink alcohol or use drugs.  REVIEW OF SYSTEMS:   Unable to obtain due to critical illness   VITAL SIGNS: Temp:  [98.1 F (36.7 C)-98.5 F (36.9 C)] 98.4 F (36.9 C) (05/31 0830) Pulse Rate:  [91-112] 99 (05/31 0800) Resp:  [16-31] 30 (05/31 0800) BP: (78-151)/(51-92) 126/75 (05/31 0800) SpO2:  [88 %-100 %] 96 % (05/31 0800) FiO2 (%):  [35 %] 35 % (05/31 0400)  Physical Examination:  Awake,oriented and nonfocal neuro exam  On HFNC, no distress,poorair entrybilaterally and no adventitious sounds S1 + S2 audible plus no murmur Benign abdomen withfeebleperistalsis, Wasted extremities with no edema  ASSESSMENT / PLAN:  Acute on chronic respiratory failure  with baseline home O2 3-4L/min nasal cannula.  Improved respiratory status tolerating HFNC 35 % at 40 L/min and only required BiPAP for 2 h last evening. -Optimize FiO2 for O2 sat 88to 92%,monitor work of breathing andPRNBiPAP -CODE STATUS DNR  COPDexacerbation -Bronchodilatorsand taperingsystemicsteroids,inhaled steroids and continue withempiric Zosyn  Atelectasis and pneumonia. stable Left basilar airspace disease. MRSA PCR  -ve. -Unasyn, monitor chest x-ray + CBC + FiO2 requirement.  Altered mental status (improved) with metabolic encephalopathy secondary to hyponatremia. No acute intracranial abnormalities and CT head. -Optimize meds and monitor neuro status  Acute hyponatremia (improved) -Management as per renal  Acute urinary retention -Foley catheter in place  Anxiety (improved) -Optimize Xanax.  Laceration of left lower extremities status post surgical intervention -Wound care  Anemia  -Monitor H&H and keep hemoglobin more than 8 g/dL  GI bleeding withmelena and occult blood in his stool positive -PPIand patient refused colonoscope.  Echo 12/08/2017. LVEF 60-65% and grade 1 diastolic dysfunction  DNR  DVT &GI prophylaxis (Off Heparin because of GI bleeding). Continue with supportive care.   Critical care time30 minutes

## 2018-01-06 NOTE — Progress Notes (Signed)
[facility] [place] [facility]   PATIENT NAME: Candace Butler    MR#:  [id number]  DATE OF BIRTH:  March 08, 1941  SUBJECTIVE:  CHIEF COMPLAINT:   Chief Complaint  Patient presents with  . Fall   Came with weakness, was here 2 weeks ago, Was discharged on oral Lasix and hydrochlorothiazide, came with generalized weakness and noted to have severe hyponatremia. Hyponatremia improved, Mental status improved after bipap use.  Cont to require intermittent bipap. Hb dropped.   Generalized weakness.  REVIEW OF SYSTEMS:  CONSTITUTIONAL: No fever, severe fatigue or weakness.  EYES: No blurred or double vision.  EARS, NOSE, AND THROAT: No tinnitus or ear pain.  RESPIRATORY: No cough, shortness of breath, wheezing or hemoptysis.  CARDIOVASCULAR: No chest pain, orthopnea, edema.  GASTROINTESTINAL: No nausea, vomiting, diarrhea or abdominal pain.  GENITOURINARY: No dysuria, hematuria.  ENDOCRINE: No polyuria, nocturia,  HEMATOLOGY: No anemia, easy bruising or bleeding SKIN: No rash or lesion. MUSCULOSKELETAL: No joint pain or arthritis.   NEUROLOGIC: No tingling, numbness, weakness.  PSYCHIATRY: No anxiety or depression.   ROS  DRUG ALLERGIES:   Allergies  Allergen Reactions  . Indomethacin   . Vicodin [Hydrocodone-Acetaminophen]     VITALS:  Blood pressure (!) 94/58, pulse (!) 103, temperature 98.4 F (36.9 C), temperature source Oral, resp. rate (!) 34, height 5\' 2"  (1.575 m), weight 76.3 kg (168 lb 3.4 oz), SpO2 98 %.  PHYSICAL EXAMINATION:  GENERAL:  77 y.o.-year-old patient lying in the bed with no acute distress.  EYES: Pupils equal, round, reactive to light and accommodation. No scleral icterus. Extraocular muscles intact.  HEENT: Head atraumatic, normocephalic. Oropharynx and nasopharynx clear.  NECK:  Supple, no jugular venous distention. No thyroid enlargement, no tenderness.  LUNGS: Normal breath sounds bilaterally, no wheezing, rales,rhonchi or  crepitation. No use of accessory muscles of respiration. Nasal canula in use. CARDIOVASCULAR: S1, S2 normal. No murmurs, rubs, or gallops.  ABDOMEN: Soft, nontender, nondistended. Bowel sounds present. No organomegaly or mass.  EXTREMITIES: No pedal edema, cyanosis, or clubbing.  NEUROLOGIC:  follows simple commands. Generalized weakness. No tremors. PSYCHIATRIC: The patient is alert and oriented.  SKIN: No obvious rash, lesion, or ulcer.   Physical Exam LABORATORY PANEL:   CBC Recent Labs  Lab 01/06/18 0637  WBC 10.2  HGB 9.6*  HCT 27.8*  PLT 292   ------------------------------------------------------------------------------------------------------------------  Chemistries  Recent Labs  Lab 01/05/18 0511  01/06/18 0637  NA 139   < > 137  K 4.8  --  4.7  CL 94*  --  91*  CO2 39*  --  40*  GLUCOSE 111*  --  89  BUN 23*  --  19  CREATININE 0.54  --  0.50  CALCIUM 8.9  --  8.6*  MG 1.9  --   --    < > = values in this interval not displayed.   ------------------------------------------------------------------------------------------------------------------  Cardiac Enzymes No results for input(s): TROPONINI in the last 168 hours. ------------------------------------------------------------------------------------------------------------------  RADIOLOGY:  Dg Chest Port 1 View  Result Date: 01/05/2018 CLINICAL DATA:  Atelectasis. EXAM: PORTABLE CHEST 1 VIEW COMPARISON:  Radiographs of Jan 03, 2018. FINDINGS: Stable cardiomediastinal silhouette. No pneumothorax is noted. Atherosclerosis of thoracic aorta is noted. Right lung is clear. Minimal left basilar subsegmental atelectasis and small left pleural effusion may be present. The visualized skeletal structures are unremarkable. IMPRESSION: Minimal left basilar subsegmental atelectasis with small left pleural effusion. Aortic Atherosclerosis (ICD10-I70.0). Electronically Signed   By: Fayrene FearingJames  Christen Butter, M.D.   On: 01/05/2018  08:34    ASSESSMENT AND PLAN:   Active Problems:   Acute hyponatremia   Melena   Acute blood loss anemia  1.  Acute severe hyponatremia,   hypertonic saline.  Patient looks clinically volume depleted.   monitor intensive care unit for close observation and management.    check sodium level frequently.  We will hold hydrochlorothiazide and Lasix on discharge.  Appreciated help by nephrology. Continue IV fluid.  sodium much improved now. 2.  Acute metabolic encephalopathy, likely related to hyponatremia.  also have hypercapnea   continue to monitor clinically closely, while treating the underlying disorder.   requiring bipap for respi failure.   Improved on nasal canula oxygen.   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 3.  Left lower extremity laceration, status post repair in the emergency room. 4.  COPD, stable.  Continue maintenance medications.    Pneumonia, cont Abx per ICU.    On Ch oxygen at home, 5.  Hypokalemia, will replace K per protocol.   As now potassium is rising, we will stop IV replacement. 6. Generalized weakness- need PT eval.   Need SNF. 7. Ac respi failure with hypercapneia   bipap in use, DNR>    Improved now. 8. GI bleed with guiac positive stool   GI consult.   Monitor Hb.    She refused to have any procedure, Hb stable, hold anticoagulants.  All the records are reviewed and case discussed with Care Management/Social Workerr. Management plans discussed with the patient, family and they are in agreement.  CODE STATUS: DNR.  TOTAL TIME TAKING CARE OF THIS PATIENT: 35 minutes.    POSSIBLE D/C IN 1-2 DAYS, DEPENDING ON CLINICAL CONDITION.   Altamese Dilling M.D on 01/06/2018   Between 7am to 6pm - Pager - [phone]  After 6pm go to [email or web address] - password [employer]  [facility]  Office  [phone]  CC: Primary care physician; Leotis Shames, MD  Note: This dictation was prepared with  Dragon dictation along with smaller phrase technology. Any transcriptional errors that result from this process are unintentional.

## 2018-01-07 ENCOUNTER — Inpatient Hospital Stay: Payer: Medicare HMO

## 2018-01-07 LAB — GLUCOSE, CAPILLARY
GLUCOSE-CAPILLARY: 107 mg/dL — AB (ref 65–99)
GLUCOSE-CAPILLARY: 79 mg/dL (ref 65–99)
GLUCOSE-CAPILLARY: 129 mg/dL — AB (ref 65–99)
GLUCOSE-CAPILLARY: 99 mg/dL (ref 65–99)
Glucose-Capillary: 113 mg/dL — ABNORMAL HIGH (ref 65–99)
Glucose-Capillary: 197 mg/dL — ABNORMAL HIGH (ref 65–99)

## 2018-01-07 LAB — BASIC METABOLIC PANEL
ANION GAP: 11 (ref 5–15)
BUN: 24 mg/dL — ABNORMAL HIGH (ref 6–20)
CALCIUM: 9.1 mg/dL (ref 8.9–10.3)
CHLORIDE: 88 mmol/L — AB (ref 101–111)
CO2: 40 mmol/L — AB (ref 22–32)
CREATININE: 0.4 mg/dL — AB (ref 0.44–1.00)
GFR calc non Af Amer: >60 mL/min (ref >60)
Glucose, Bld: 113 mg/dL — ABNORMAL HIGH (ref 65–99)
Potassium: 4.7 mmol/L (ref 3.5–5.1)
SODIUM: 139 mmol/L (ref 135–145)

## 2018-01-07 MED ORDER — LORATADINE 10 MG PO TABS
10.0000 mg | ORAL_TABLET | Freq: Every day | ORAL | Status: DC
  Administered 2018-01-07 – 2018-01-08 (×2): 10 mg via ORAL
  Filled 2018-01-07 (×2): qty 1

## 2018-01-07 NOTE — Progress Notes (Signed)
Pharmacy Electrolyte Monitoring Consult:  Pharmacy consulted to assist in monitoring and replacing electrolytes in this 77 y.o. female admitted on 12/28/2017 with hyponatremia secondary to over diuresis.    Labs:  Sodium (mmol/L)  Date Value  01/07/2018 139   Potassium (mmol/L)  Date Value  01/07/2018 4.7   Magnesium (mg/dL)  Date Value  47/82/956205/30/2019 1.9   Phosphorus (mg/dL)  Date Value  13/08/657805/30/2019 3.6   Calcium (mg/dL)  Date Value  46/96/295206/08/2017 9.1   Albumin (g/dL)  Date Value  84/13/244005/22/2019 4.1    Assessment/Plan: Will continue MagOX 400 mg PO BID. No other supplementation needed at this time.  Will f/u with 6/2 AM labs.   Pharmacy will continue to monitor and adjust per consult.  Anberlin Diez A,  01/07/2018 9:12 AM

## 2018-01-07 NOTE — Clinical Social Work Note (Signed)
CSW aware that patient is now out of ICU. Discharge plan to [facility] remains the same.  Candace PonderKaren Martha Abdulkadir Emmanuel, MSW, Theresia MajorsLCSWA [phone]

## 2018-01-07 NOTE — Progress Notes (Signed)
Patient in no distress at this time. Resting comfortably on nasal o2. bipap on sb

## 2018-01-07 NOTE — Progress Notes (Signed)
Pharmacy Electrolyte Monitoring Consult:  Pharmacy consulted to assist in monitoring and replacing electrolytes in this 77 y.o. female admitted on 12/28/2017 with hyponatremia secondary to over diuresis.    Labs:  Sodium (mmol/L)  Date Value  01/06/2018 137   Potassium (mmol/L)  Date Value  01/06/2018 4.7   Magnesium (mg/dL)  Date Value  16/10/960405/30/2019 1.9   Phosphorus (mg/dL)  Date Value  54/09/811905/30/2019 3.6   Calcium (mg/dL)  Date Value  14/78/295605/31/2019 8.6 (L)   Albumin (g/dL)  Date Value  21/30/865705/22/2019 4.1    Assessment/Plan: Will continue MagOX 400 mg PO BID. No other supplementation needed at this time.  Will f/u with 6/1 AM labs.   Pharmacy will continue to monitor and adjust per consult.  Baudelio Karnes L,  01/07/2018 12:08 AM

## 2018-01-07 NOTE — Progress Notes (Signed)
Spoke to Aristocrat RanchettesDebbie and updated on patient new room assignment. Attempted to call report, the receiving RN is discharging 2 other patients and will call back.

## 2018-01-08 LAB — BASIC METABOLIC PANEL
Anion gap: 7 (ref 5–15)
BUN: 22 mg/dL — ABNORMAL HIGH (ref 6–20)
CHLORIDE: 86 mmol/L — AB (ref 101–111)
CO2: 45 mmol/L — AB (ref 22–32)
Calcium: 8.7 mg/dL — ABNORMAL LOW (ref 8.9–10.3)
Creatinine, Ser: 0.45 mg/dL (ref 0.44–1.00)
GFR calc non Af Amer: >60 mL/min (ref >60)
Glucose, Bld: 143 mg/dL — ABNORMAL HIGH (ref 65–99)
POTASSIUM: 4.9 mmol/L (ref 3.5–5.1)
Sodium: 138 mmol/L (ref 135–145)

## 2018-01-08 LAB — GLUCOSE, CAPILLARY
GLUCOSE-CAPILLARY: 67 mg/dL (ref 65–99)
GLUCOSE-CAPILLARY: 118 mg/dL — AB (ref 65–99)
GLUCOSE-CAPILLARY: 93 mg/dL (ref 65–99)
Glucose-Capillary: 116 mg/dL — ABNORMAL HIGH (ref 65–99)

## 2018-01-08 MED ORDER — SODIUM CHLORIDE 0.9% FLUSH
3.0000 mL | Freq: Two times a day (BID) | INTRAVENOUS | Status: DC
  Administered 2018-01-08 – 2018-01-09 (×3): 3 mL via INTRAVENOUS

## 2018-01-08 MED ORDER — MORPHINE SULFATE (PF) 2 MG/ML IV SOLN
2.0000 mg | INTRAVENOUS | Status: DC | PRN
Start: 2018-01-08 — End: 2018-01-10
  Administered 2018-01-09 (×2): 2 mg via INTRAVENOUS
  Filled 2018-01-08 (×2): qty 1

## 2018-01-08 NOTE — Progress Notes (Signed)
[facility] - [place] at [facility]   PATIENT NAME: Candace Butler    MR#:  [id number]  DATE OF BIRTH:  05-Nov-1940  SUBJECTIVE:  CHIEF COMPLAINT:   Chief Complaint  Patient presents with  . Fall   Came with weakness, was here 2 weeks ago, Was discharged on oral Lasix and hydrochlorothiazide, came with generalized weakness and noted to have severe hyponatremia. Hyponatremia improved, Mental status improved after bipap use.  Cont to require intermittent bipap. Hb dropped.   Generalized weakness. On HFNC oxygen - tapered to nasal canula , tolerating so far.  still feels SOB on 5-6 ltr oxygen.  REVIEW OF SYSTEMS:  CONSTITUTIONAL: No fever, severe fatigue or weakness.  EYES: No blurred or double vision.  EARS, NOSE, AND THROAT: No tinnitus or ear pain.  RESPIRATORY: No cough, have shortness of breath, wheezing or hemoptysis.  CARDIOVASCULAR: No chest pain, orthopnea, edema.  GASTROINTESTINAL: No nausea, vomiting, diarrhea or abdominal pain.  GENITOURINARY: No dysuria, hematuria.  ENDOCRINE: No polyuria, nocturia,  HEMATOLOGY: No anemia, easy bruising or bleeding SKIN: No rash or lesion. MUSCULOSKELETAL: No joint pain or arthritis.   NEUROLOGIC: No tingling, numbness, weakness.  PSYCHIATRY: No anxiety or depression.   ROS  DRUG ALLERGIES:   Allergies  Allergen Reactions  . Indomethacin   . Vicodin [Hydrocodone-Acetaminophen]     VITALS:  Blood pressure (!) 111/54, pulse (!) 102, temperature 98.6 F (37 C), temperature source Oral, resp. rate 18, height 5\' 2"  (1.575 m), weight 75.6 kg (166 lb 9.6 oz), SpO2 96 %.  PHYSICAL EXAMINATION:  GENERAL:  77 y.o.-year-old patient lying in the bed with no acute distress.  EYES: Pupils equal, round, reactive to light and accommodation. No scleral icterus. Extraocular muscles intact.  HEENT: Head atraumatic, normocephalic. Oropharynx and nasopharynx clear.  NECK:  Supple, no jugular venous distention. No thyroid  enlargement, no tenderness.  LUNGS: Normal breath sounds bilaterally, no wheezing, rales,rhonchi or crepitation. No use of accessory muscles of respiration. Nasal canula in use. CARDIOVASCULAR: S1, S2 normal. No murmurs, rubs, or gallops.  ABDOMEN: Soft, nontender, nondistended. Bowel sounds present. No organomegaly or mass.  EXTREMITIES: No pedal edema, cyanosis, or clubbing.  NEUROLOGIC:  follows simple commands. Generalized weakness. No tremors. PSYCHIATRIC: The patient is alert and oriented.  SKIN: No obvious rash, lesion, or ulcer.   Physical Exam LABORATORY PANEL:   CBC Recent Labs  Lab 01/06/18 0637  WBC 10.2  HGB 9.6*  HCT 27.8*  PLT 292   ------------------------------------------------------------------------------------------------------------------  Chemistries  Recent Labs  Lab 01/05/18 0511  01/08/18 0629  NA 139   < > 138  K 4.8   < > 4.9  CL 94*   < > 86*  CO2 39*   < > 45*  GLUCOSE 111*   < > 143*  BUN 23*   < > 22*  CREATININE 0.54   < > 0.45  CALCIUM 8.9   < > 8.7*  MG 1.9  --   --    < > = values in this interval not displayed.   ------------------------------------------------------------------------------------------------------------------  Cardiac Enzymes No results for input(s): TROPONINI in the last 168 hours. ------------------------------------------------------------------------------------------------------------------  RADIOLOGY:  Dg Chest Port 1 View  Result Date: 01/07/2018 CLINICAL DATA:  Pneumonia EXAM: PORTABLE CHEST 1 VIEW COMPARISON:  Chest x-rays dated 01/05/2018 and 01/03/2018. FINDINGS: Stable cardiomegaly. Aortic atherosclerosis. Coarse lung markings bilaterally suggesting some degree of chronic interstitial lung disease and/or chronic bronchitic changes. Blunting at the LEFT costophrenic angle persists, atelectasis and/or  small pleural effusion. No new lung findings. No pneumothorax seen. IMPRESSION: 1. Stable chest x-ray.  Small LEFT pleural effusion and/or atelectasis. Stable cardiomegaly. 2. Chronic interstitial lung disease and/or chronic bronchitic changes. 3. Aortic atherosclerosis. Electronically Signed   By: Bary RichardStan  Maynard M.D.   On: 01/07/2018 07:07    ASSESSMENT AND PLAN:   Active Problems:   Acute hyponatremia   Melena   Acute blood loss anemia  1.  Acute severe hyponatremia,   hypertonic saline.  Patient looks clinically volume depleted.   monitor intensive care unit for close observation and management.    check sodium level frequently.  We will hold hydrochlorothiazide and Lasix on discharge.  Appreciated help by nephrology. Continue IV fluid.  sodium much improved now. 2.  Acute metabolic encephalopathy, likely related to hyponatremia.  also have hypercapnea   continue to monitor clinically closely, while treating the underlying disorder.   requiring bipap for respi failure.   Improved on nasal canula oxygen.   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   On HFNC and down to nasal canula oxygen now, seems to have severe anxiety.  pt and family agreed now to comfort care. 3.  Left lower extremity laceration, status post repair in the emergency room. 4.  COPD, stable.  Continue maintenance medications.    Pneumonia, cont Abx per ICU. May stop after 7 days total course. ( start date 01/02/18)    On Ch oxygen at home,    Inhaled steroids. Nebs.     Comfort care now. 5.  Hypokalemia, will replace K per protocol.   Stable now. 6. Generalized weakness- need PT eval.   Need SNF. 7. Ac respi failure with hypercapneia   bipap in use, DNR.    Improved now. On [place]. 8. GI bleed with guiac positive stool   GI consult.   Monitor Hb.    She refused to have any procedure, Hb stable, hold anticoagulants.  All the records are reviewed and case discussed with Care Management/Social Workerr. Management plans discussed with the patient, family and they are in agreement.  CODE  STATUS: DNR.  TOTAL TIME TAKING CARE OF THIS PATIENT: 35 minutes.   POSSIBLE D/C IN 1-2 DAYS, DEPENDING ON CLINICAL CONDITION. Will have to get hospice nurse for  Placement tomorrow.  Altamese DillingVaibhavkumar Gorje Iyer M.D on 01/08/2018   Between 7am to 6pm - Pager - [phone]  After 6pm go to [email or web address] - password [email or web address]  [facility] [place] [facility]  Office  [phone]  CC: Primary care physician; Leotis ShamesSingh, Jasmine, MD  Note: This dictation was prepared with Dragon dictation along with smaller phrase technology. Any transcriptional errors that result from this process are unintentional.

## 2018-01-08 NOTE — Progress Notes (Signed)
Pharmacy Electrolyte Monitoring Consult:  Pharmacy consulted to assist in monitoring and replacing electrolytes in this [age] y.o. female admitted on 12/28/2017 with hyponatremia secondary to over diuresis.    Labs:  Sodium (mmol/L)  Date Value  01/08/2018 138   Potassium (mmol/L)  Date Value  01/08/2018 4.9   Magnesium (mg/dL)  Date Value  16/10/960405/30/2019 1.9   Phosphorus (mg/dL)  Date Value  54/09/811905/30/2019 3.6   Calcium (mg/dL)  Date Value  14/78/295606/09/2017 8.7 (L)   Albumin (g/dL)  Date Value  21/30/865705/22/2019 4.1    Assessment/Plan: Will continue MagOX 400 mg PO BID. No other supplementation needed at this time.  Will f/u with  AM labs.   Pharmacy will continue to monitor and adjust per consult.  Davi Kroon A,  01/08/2018 11:37 AM

## 2018-01-08 NOTE — Plan of Care (Signed)
  Problem: Clinical Measurements: Goal: Will remain free from infection Outcome: Progressing   Problem: Safety: Goal: Ability to remain free from injury will improve Outcome: Progressing   

## 2018-01-08 NOTE — Progress Notes (Signed)
Pt refused neuro check. Will continue to monitor.

## 2018-01-08 NOTE — Progress Notes (Signed)
[employer] [facility] - [place] at [facility]   PATIENT NAME: Candace GibbonGlenda Butler    MR#:  [id number]  DATE OF BIRTH:  09/07/1940  SUBJECTIVE:  CHIEF COMPLAINT:   Chief Complaint  Patient presents with  . Fall   Came with weakness, was here 2 weeks ago, Was discharged on oral Lasix and hydrochlorothiazide, came with generalized weakness and noted to have severe hyponatremia. Hyponatremia improved, Mental status improved after bipap use.  Cont to require intermittent bipap. Hb dropped.   Generalized weakness. On HFNC oxygen - tapered to nasal canula , tolerating so far.  REVIEW OF SYSTEMS:  CONSTITUTIONAL: No fever, severe fatigue or weakness.  EYES: No blurred or double vision.  EARS, NOSE, AND THROAT: No tinnitus or ear pain.  RESPIRATORY: No cough, shortness of breath, wheezing or hemoptysis.  CARDIOVASCULAR: No chest pain, orthopnea, edema.  GASTROINTESTINAL: No nausea, vomiting, diarrhea or abdominal pain.  GENITOURINARY: No dysuria, hematuria.  ENDOCRINE: No polyuria, nocturia,  HEMATOLOGY: No anemia, easy bruising or bleeding SKIN: No rash or lesion. MUSCULOSKELETAL: No joint pain or arthritis.   NEUROLOGIC: No tingling, numbness, weakness.  PSYCHIATRY: No anxiety or depression.   ROS  DRUG ALLERGIES:   Allergies  Allergen Reactions  . Indomethacin   . Vicodin [Hydrocodone-Acetaminophen]     VITALS:  Blood pressure (!) 111/54, pulse (!) 102, temperature 98.6 F (37 C), temperature source Oral, resp. rate 18, height 5\' 2"  (1.575 m), weight 75.6 kg (166 lb 9.6 oz), SpO2 96 %.  PHYSICAL EXAMINATION:  GENERAL:  77 y.o.-year-old patient lying in the bed with no acute distress.  EYES: Pupils equal, round, reactive to light and accommodation. No scleral icterus. Extraocular muscles intact.  HEENT: Head atraumatic, normocephalic. Oropharynx and nasopharynx clear.  NECK:  Supple, no jugular venous distention. No thyroid enlargement, no tenderness.  LUNGS: Normal breath  sounds bilaterally, no wheezing, rales,rhonchi or crepitation. No use of accessory muscles of respiration. Nasal canula in use. CARDIOVASCULAR: S1, S2 normal. No murmurs, rubs, or gallops.  ABDOMEN: Soft, nontender, nondistended. Bowel sounds present. No organomegaly or mass.  EXTREMITIES: No pedal edema, cyanosis, or clubbing.  NEUROLOGIC:  follows simple commands. Generalized weakness. No tremors. PSYCHIATRIC: The patient is alert and oriented.  SKIN: No obvious rash, lesion, or ulcer.   Physical Exam LABORATORY PANEL:   CBC Recent Labs  Lab 01/06/18 0637  WBC 10.2  HGB 9.6*  HCT 27.8*  PLT 292   ------------------------------------------------------------------------------------------------------------------  Chemistries  Recent Labs  Lab 01/05/18 0511  01/08/18 0629  NA 139   < > 138  K 4.8   < > 4.9  CL 94*   < > 86*  CO2 39*   < > 45*  GLUCOSE 111*   < > 143*  BUN 23*   < > 22*  CREATININE 0.54   < > 0.45  CALCIUM 8.9   < > 8.7*  MG 1.9  --   --    < > = values in this interval not displayed.   ------------------------------------------------------------------------------------------------------------------  Cardiac Enzymes No results for input(s): TROPONINI in the last 168 hours. ------------------------------------------------------------------------------------------------------------------  RADIOLOGY:  Dg Chest Port 1 View  Result Date: 01/07/2018 CLINICAL DATA:  Pneumonia EXAM: PORTABLE CHEST 1 VIEW COMPARISON:  Chest x-rays dated 01/05/2018 and 01/03/2018. FINDINGS: Stable cardiomegaly. Aortic atherosclerosis. Coarse lung markings bilaterally suggesting some degree of chronic interstitial lung disease and/or chronic bronchitic changes. Blunting at the LEFT costophrenic angle persists, atelectasis and/or small pleural effusion. No new lung findings. No pneumothorax  seen. IMPRESSION: 1. Stable chest x-ray. Small LEFT pleural effusion and/or atelectasis. Stable  cardiomegaly. 2. Chronic interstitial lung disease and/or chronic bronchitic changes. 3. Aortic atherosclerosis. Electronically Signed   By: Bary Richard M.D.   On: 01/07/2018 07:07    ASSESSMENT AND PLAN:   Active Problems:   Acute hyponatremia   Melena   Acute blood loss anemia  1.  Acute severe hyponatremia,   hypertonic saline.  Patient looks clinically volume depleted.   monitor intensive care unit for close observation and management.    check sodium level frequently.  We will hold hydrochlorothiazide and Lasix on discharge.  Appreciated help by nephrology. Continue IV fluid.  sodium much improved now. 2.  Acute metabolic encephalopathy, likely related to hyponatremia.  also have hypercapnea   continue to monitor clinically closely, while treating the underlying disorder.   requiring bipap for respi failure.   Improved on nasal canula oxygen.   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   On HFNC and down to nasal canula oxygen now, seems to have severe anxiety. 3.  Left lower extremity laceration, status post repair in the emergency room. 4.  COPD, stable.  Continue maintenance medications.    Pneumonia, cont Abx per ICU. May stop after 7 days total course. ( start date 01/02/18)    On Ch oxygen at home,    Inhaled steroids. Nebs. 5.  Hypokalemia, will replace K per protocol.   Stable now. 6. Generalized weakness- need PT eval.   Need SNF. 7. Ac respi failure with hypercapneia   bipap in use, DNR.    Improved now. On [place]. 8. GI bleed with guiac positive stool   GI consult.   Monitor Hb.    She refused to have any procedure, Hb stable, hold anticoagulants.  All the records are reviewed and case discussed with Care Management/Social Workerr. Management plans discussed with the patient, family and they are in agreement.  CODE STATUS: DNR.  TOTAL TIME TAKING CARE OF THIS PATIENT: 35 minutes.    POSSIBLE D/C IN 1-2 DAYS, DEPENDING ON CLINICAL  CONDITION.   Altamese Dilling M.D on 01/08/2018   Between 7am to 6pm - Pager - [phone]  After 6pm go to [email or web address] - password [email or web address]  [facility] [place] [facility]  Office  [phone]  CC: Primary care physician; Leotis Shames, MD  Note: This dictation was prepared with Dragon dictation along with smaller phrase technology. Any transcriptional errors that result from this process are unintentional.

## 2018-01-08 NOTE — Progress Notes (Signed)
Family Meeting Note  Advance Directive:yes  Today a meeting took place with the 3 daughters.   The following clinical team members were present during this meeting:MD  The following were discussed:Patient's diagnosis: COPD, respi failure , Patient's progosis: < 6 months and Goals for treatment: DNR  Patient has severe COPD and requiring 3-4 L oxygen at her baseline. Since last 1 month this is secondary admission in the hospital and patient is not able to get up or walk much. She had long stay this time in the ICU while requiring BiPAP and came down to 5-6 L oxygen now. Still continued to feel short of breath, asking to increase oxygen.  Daughters talked to me, they seems, pt is giving up and reaching to end of life. I had detailed discussion with them about the patient having terminal COPD and, likely to have recurrent admissions due to that in near future. They understand that and agreed to meet with palliative care and involve hospice in her care as they don't want to prolong her suffering. Later patient asked me to increase her oxygen even from 6 L as she is feeling short of breath, and I had spoke to her daughters and they agreed to have her switch her to comfort care with using IV morphine to make her comfortable.  Additional follow-up to be provided: palliative care.  Time spent during discussion:20 minutes  Altamese DillingVaibhavkumar Jeree Delcid, MD

## 2018-01-08 NOTE — Progress Notes (Signed)
Pt was made comfort care per family request. 6 L oxygen. No tele. Pt reports pain and received meds. A & O. Pt has no further concerns at this time.

## 2018-01-08 NOTE — Progress Notes (Signed)
[employer] - [place] at [facility]   PATIENT NAME: Candace Butler    MR#:  [id number]  DATE OF BIRTH:  1941/02/15  SUBJECTIVE:  CHIEF COMPLAINT:   Chief Complaint  Patient presents with  . Fall   Came with weakness, was here 2 weeks ago, Was discharged on oral Lasix and hydrochlorothiazide, came with generalized weakness and noted to have severe hyponatremia. Hyponatremia improved, Mental status improved after bipap use.  Cont to require intermittent bipap. Hb dropped.   Generalized weakness. On HFNC oxygen now, tolerating so far.  REVIEW OF SYSTEMS:  CONSTITUTIONAL: No fever, severe fatigue or weakness.  EYES: No blurred or double vision.  EARS, NOSE, AND THROAT: No tinnitus or ear pain.  RESPIRATORY: No cough, shortness of breath, wheezing or hemoptysis.  CARDIOVASCULAR: No chest pain, orthopnea, edema.  GASTROINTESTINAL: No nausea, vomiting, diarrhea or abdominal pain.  GENITOURINARY: No dysuria, hematuria.  ENDOCRINE: No polyuria, nocturia,  HEMATOLOGY: No anemia, easy bruising or bleeding SKIN: No rash or lesion. MUSCULOSKELETAL: No joint pain or arthritis.   NEUROLOGIC: No tingling, numbness, weakness.  PSYCHIATRY: No anxiety or depression.   ROS  DRUG ALLERGIES:   Allergies  Allergen Reactions  . Indomethacin   . Vicodin [Hydrocodone-Acetaminophen]     VITALS:  Blood pressure (!) 111/54, pulse (!) 102, temperature 98.6 F (37 C), temperature source Oral, resp. rate 18, height 5\' 2"  (1.575 m), weight 75.6 kg (166 lb 9.6 oz), SpO2 96 %.  PHYSICAL EXAMINATION:  GENERAL:  77 y.o.-year-old patient lying in the bed with no acute distress.  EYES: Pupils equal, round, reactive to light and accommodation. No scleral icterus. Extraocular muscles intact.  HEENT: Head atraumatic, normocephalic. Oropharynx and nasopharynx clear.  NECK:  Supple, no jugular venous distention. No thyroid enlargement, no tenderness.  LUNGS: Normal breath sounds bilaterally, no  wheezing, rales,rhonchi or crepitation. No use of accessory muscles of respiration. HF Nasal canula in use. CARDIOVASCULAR: S1, S2 normal. No murmurs, rubs, or gallops.  ABDOMEN: Soft, nontender, nondistended. Bowel sounds present. No organomegaly or mass.  EXTREMITIES: No pedal edema, cyanosis, or clubbing.  NEUROLOGIC:  follows simple commands. Generalized weakness. No tremors. PSYCHIATRIC: The patient is alert and oriented.  SKIN: No obvious rash, lesion, or ulcer.   Physical Exam LABORATORY PANEL:   CBC Recent Labs  Lab 01/06/18 0637  WBC 10.2  HGB 9.6*  HCT 27.8*  PLT 292   ------------------------------------------------------------------------------------------------------------------  Chemistries  Recent Labs  Lab 01/05/18 0511  01/08/18 0629  NA 139   < > 138  K 4.8   < > 4.9  CL 94*   < > 86*  CO2 39*   < > 45*  GLUCOSE 111*   < > 143*  BUN 23*   < > 22*  CREATININE 0.54   < > 0.45  CALCIUM 8.9   < > 8.7*  MG 1.9  --   --    < > = values in this interval not displayed.   ------------------------------------------------------------------------------------------------------------------  Cardiac Enzymes No results for input(s): TROPONINI in the last 168 hours. ------------------------------------------------------------------------------------------------------------------  RADIOLOGY:  Dg Chest Port 1 View  Result Date: 01/07/2018 CLINICAL DATA:  Pneumonia EXAM: PORTABLE CHEST 1 VIEW COMPARISON:  Chest x-rays dated 01/05/2018 and 01/03/2018. FINDINGS: Stable cardiomegaly. Aortic atherosclerosis. Coarse lung markings bilaterally suggesting some degree of chronic interstitial lung disease and/or chronic bronchitic changes. Blunting at the LEFT costophrenic angle persists, atelectasis and/or small pleural effusion. No new lung findings. No pneumothorax seen. IMPRESSION: 1. Stable  chest x-ray. Small LEFT pleural effusion and/or atelectasis. Stable cardiomegaly. 2.  Chronic interstitial lung disease and/or chronic bronchitic changes. 3. Aortic atherosclerosis. Electronically Signed   By: Bary Richard M.D.   On: 01/07/2018 07:07    ASSESSMENT AND PLAN:   Active Problems:   Acute hyponatremia   Melena   Acute blood loss anemia  1.  Acute severe hyponatremia,   hypertonic saline.  Patient looks clinically volume depleted.   monitor intensive care unit for close observation and management.    check sodium level frequently.  We will hold hydrochlorothiazide and Lasix on discharge.  Appreciated help by nephrology. Continue IV fluid.  sodium much improved now. 2.  Acute metabolic encephalopathy, likely related to hyponatremia.  also have hypercapnea   continue to monitor clinically closely, while treating the underlying disorder.   requiring bipap for respi failure.   Improved on nasal canula oxygen.   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   On HFNC now. 3.  Left lower extremity laceration, status post repair in the emergency room. 4.  COPD, stable.  Continue maintenance medications.    Pneumonia, cont Abx per ICU. May stop after 7 days total course. ( start date 01/02/18)    On Ch oxygen at home, 5.  Hypokalemia, will replace K per protocol.   As now potassium is rising, we will stop IV replacement. 6. Generalized weakness- need PT eval.   Need SNF. 7. Ac respi failure with hypercapneia   bipap in use, DNR.    Improved now. On HFNC. 8. GI bleed with guiac positive stool   GI consult.   Monitor Hb.    She refused to have any procedure, Hb stable, hold anticoagulants.  All the records are reviewed and case discussed with Care Management/Social Workerr. Management plans discussed with the patient, family and they are in agreement.  CODE STATUS: DNR.  TOTAL TIME TAKING CARE OF THIS PATIENT: 35 minutes.    POSSIBLE D/C IN 1-2 DAYS, DEPENDING ON CLINICAL CONDITION.   Altamese Dilling M.D on 01/08/2018    Between 7am to 6pm - Pager - [phone]  After 6pm go to [email or web address] - password [employer]  [facility] [place] [facility]  Office  [phone]  CC: Primary care physician; Leotis Shames, MD  Note: This dictation was prepared with Dragon dictation along with smaller phrase technology. Any transcriptional errors that result from this process are unintentional.

## 2018-01-09 DIAGNOSIS — R4182 Altered mental status, unspecified: Secondary | ICD-10-CM

## 2018-01-09 DIAGNOSIS — F411 Generalized anxiety disorder: Secondary | ICD-10-CM

## 2018-01-09 DIAGNOSIS — R52 Pain, unspecified: Secondary | ICD-10-CM

## 2018-01-09 DIAGNOSIS — R0602 Shortness of breath: Secondary | ICD-10-CM

## 2018-01-09 LAB — BASIC METABOLIC PANEL
ANION GAP: 8 (ref 5–15)
BUN: 24 mg/dL — ABNORMAL HIGH (ref 6–20)
CALCIUM: 8.9 mg/dL (ref 8.9–10.3)
CO2: 46 mmol/L — ABNORMAL HIGH (ref 22–32)
Chloride: 84 mmol/L — ABNORMAL LOW (ref 101–111)
Creatinine, Ser: 0.37 mg/dL — ABNORMAL LOW (ref 0.44–1.00)
Glucose, Bld: 80 mg/dL (ref 65–99)
Potassium: 4.7 mmol/L (ref 3.5–5.1)
Sodium: 138 mmol/L (ref 135–145)

## 2018-01-09 MED ORDER — ENSURE ENLIVE PO LIQD: 237.0000 mL | Freq: Two times a day (BID) | ORAL | Status: DC | PRN

## 2018-01-09 MED ORDER — BIOTENE DRY MOUTH MT LIQD: 15.0000 mL | OROMUCOSAL | Status: DC | PRN

## 2018-01-09 MED ORDER — MORPHINE SULFATE 10 MG/5ML PO SOLN: 5.0000 mg | ORAL | 0 refills | Status: AC | PRN

## 2018-01-09 MED ORDER — GLYCOPYRROLATE 0.2 MG/ML IJ SOLN
0.2000 mg | INTRAMUSCULAR | Status: DC | PRN
  Filled 2018-01-09: qty 1

## 2018-01-09 MED ORDER — ALPRAZOLAM 0.5 MG PO TABS: 0.5000 mg | ORAL_TABLET | Freq: Four times a day (QID) | ORAL | 0 refills | Status: AC | PRN

## 2018-01-09 MED ORDER — GLYCOPYRROLATE 1 MG PO TABS
1.0000 mg | ORAL_TABLET | ORAL | Status: DC | PRN
  Filled 2018-01-09: qty 1

## 2018-01-09 MED ORDER — GLYCOPYRROLATE 1 MG PO TABS: 1.0000 mg | ORAL_TABLET | ORAL | 0 refills | Status: AC | PRN

## 2018-01-09 NOTE — Progress Notes (Signed)
[employer] - [place] at [facility]   PATIENT NAME: Candace GibbonGlenda Butler    MR#:  [id number]  DATE OF BIRTH:  08-10-1940  SUBJECTIVE:  Patient on comfort care  REVIEW OF SYSTEMS:   Review of Systems  Constitutional: Negative for fever, chills weight loss HENT: Negative for ear pain, nosebleeds, congestion, facial swelling, rhinorrhea, neck pain, neck stiffness and ear discharge.   Respiratory: ++ for cough, shortness of breath, wheezing  Cardiovascular: Negative for chest pain, palpitations and leg swelling.  Gastrointestinal: Negative for heartburn, abdominal pain, vomiting, diarrhea or consitpation Genitourinary: Negative for dysuria, urgency, frequency, hematuria Musculoskeletal: Negative for back pain or joint pain Neurological: Negative for dizziness, seizures, syncope, focal weakness,  numbness and headaches.  Hematological: Does not bruise/bleed easily.  Psychiatric/Behavioral: Negative for hallucinations, confusion, dysphoric mood    Tolerating Diet: yes      DRUG ALLERGIES:   Allergies  Allergen Reactions  . Indomethacin   . Vicodin [Hydrocodone-Acetaminophen]     VITALS:  Blood pressure (!) 116/52, pulse (!) 105, temperature 98.5 F (36.9 C), temperature source Oral, resp. rate 18, height 5\' 2"  (1.575 m), weight 75.6 kg (166 lb 9.6 oz), SpO2 99 %.  PHYSICAL EXAMINATION:  Constitutional: Appears medically ill-appearing with mild respiratory distress no distress. HENT: Normocephalic. [place]. Oropharynx is clear and moist.  Eyes: Conjunctivae and EOM are normal. PERRLA, no scleral icterus.  Neck: Normal ROM. Neck supple. No JVD. No tracheal deviation. CVS: RRR, S1/S2 +, no murmurs, no gallops, no carotid bruit.  Pulmonary: Rhonchi abdominal: Soft. BS +,  no distension, tenderness, rebound or guarding.  Musculoskeletal: Normal range of motion. No edema and no tenderness.  Neuro: Alert. CN 2-12 grossly intact. No focal deficits. Skin: Skin is warm and dry.  No rash noted. Psychiatric: Flat affect but confused    LABORATORY PANEL:   CBC Recent Labs  Lab 01/06/18 0637  WBC 10.2  HGB 9.6*  HCT 27.8*  PLT 292   ------------------------------------------------------------------------------------------------------------------  Chemistries  Recent Labs  Lab 01/05/18 0511  01/09/18 0432  NA 139   < > 138  K 4.8   < > 4.7  CL 94*   < > 84*  CO2 39*   < > 46*  GLUCOSE 111*   < > 80  BUN 23*   < > 24*  CREATININE 0.54   < > 0.37*  CALCIUM 8.9   < > 8.9  MG 1.9  --   --    < > = values in this interval not displayed.   ------------------------------------------------------------------------------------------------------------------  Cardiac Enzymes No results for input(s): TROPONINI in the last 168 hours. ------------------------------------------------------------------------------------------------------------------  RADIOLOGY:  No results found.   ASSESSMENT AND PLAN:   77 year old female with severe COPD with chronic hypoxic respiratory failure presented to the emergency room on May 22 due to confusion and hallucinations and found to have severe hyponatremia.  1.  Acute metabolic encephalopathy in the setting of severe hyponatremia  2.  Severe hyponatremia: This was treated with hypertonic solution and patient was evaluated by nephrology.  3.  Left lower extremely laceration status post repair in the emergency room  4.  Acute on chronic hypoxic respiratory failure with hypercapnia: Patient was in the ICU on BiPAP and was treated for COPD exacerbation as well as pneumonia She is on nasal cannula Respiratory status is very tenuous. Due to her overall very poor prognosis family has decided on comfort care measures and palliative care consult was obtained  5.  GI bleed  with guaiac positive stool: Hemoglobin remained relatively stable.  No further work-up was performed         CODE STATUS: dnr  TOTAL TIME  TAKING CARE OF THIS PATIENT: 22 minutes.     POSSIBLE D/C today, DEPENDING ON hospice home  Libni Fusaro M.D on 01/09/2018 at 11:38 AM  Between 7am to 6pm - Pager - [phone] After 6pm go to [email or web address] - password [email or web address]  [facility] [place] [facility]  Office  [phone]  CC: Primary care physician; Leotis Shames, MD  Note: This dictation was prepared with Dragon dictation along with smaller phrase technology. Any transcriptional errors that result from this process are unintentional.

## 2018-01-09 NOTE — Progress Notes (Signed)
New hospice home referral received from [place] following a Palliative Medicine consult. Patient is a 77 year old woman with a known history of COPD, HTN and anxiety admitted to [facility] from [facility] on 5/22 for evaluation confusion, hallucinations and generalized weakness. In the ED she was found to have a sodium of 102, chest xray and brain CT were negative for any acute process, she was admitted for further evaluation and treatment. Both nephrology and cardiology were consulted and due to her poor prognosis and over all decline Palliative Medicine was consulted for goals of care. Palliative NP Jobe Gibbon met again this morning with patient and her daughter Jackelyn Poling. They wish to focus on comfort with transfer to the hospice home. Patient is requiring xanax and IV morphine for symptom management. Writer advised Hospital care team and spoke with Debbie via telephone tom advise there was not currently a bed available.  Writer will meet with Debbie tomorrow at 9 am, with plan to transfer patient to the Hospice home later in the day. Hospital care team updated. Patient information faxed to referral. Thank you for the opportunity to be involved in the care of this patient. Flo Shanks RN, BSN, [facility] [place], hospital liaison [phone]

## 2018-01-09 NOTE — Clinical Social Work Note (Addendum)
CSW received referral for patient needing hospice facility placement.  CSW spoke with patient's daughter Eunice BlaseDebbie, [phone] and she would like [facility] hosp[facility] home, CSW contacted nurse liasion left message awaiting call back regarding availability.  12:05pm  CSW spoke to nurse liaison, and she will review patient's information and also let CSW know if bed is available.  12:30pm  CSW spoke to nurse liasion, she said there is not a bed available for today.  CSW to continue to follow patient's progress throughout discharge planning CSW updated physician.  3:15pm CSW received a phone call from nurse liaison, that patient has a bed available on Tuesday for patient to go to hospice home.  Nurse liaison, will meet with family at 9am Tuesday to discuss hospice placement and process.  Ervin KnackEric R. Nyesha Cliff, MSW, Theresia MajorsLCSWA [phone]  01/09/2018 11:25 AM

## 2018-01-09 NOTE — Discharge Summary (Addendum)
[facility] [place] NAME: Candace Butler    MR#:  [id number]  DATE OF BIRTH:  1941/01/01  DATE OF ADMISSION:  12/28/2017 ADMITTING PHYSICIAN: Amelia Jo, MD  DATE OF DISCHARGE: 01/10/2018  PRIMARY CARE PHYSICIAN: Glendon Axe, MD    ADMISSION DIAGNOSIS:  Acute hyponatremia [E87.1] Leg laceration, left, initial encounter [S81.812A] Contusion of multiple sites of left lower extremity, initial encounter [S80.12XA] Altered mental status, unspecified altered mental status type [R41.82]  DISCHARGE DIAGNOSIS:  Active Problems:   Acute hyponatremia   Melena   Acute blood loss anemia   SECONDARY DIAGNOSIS:   Past Medical History:  Diagnosis Date  . Asthma   . COPD (chronic obstructive pulmonary disease) ([place])   . Hypertension     HOSPITAL COURSE:  77 year old female with severe COPD with chronic hypoxic respiratory failure presented to the emergency room on May 22 due to confusion and hallucinations and found to have severe hyponatremia.  1.  Acute metabolic encephalopathy in the setting of severe hyponatremia  2.  Severe hyponatremia: This was treated with hypertonic solution and patient was evaluated by nephrology.  3.  Left lower extremely laceration status post repair in the emergency room  4.  Acute on chronic hypoxic respiratory failure with hypercapnia: Patient was in the ICU on BiPAP and was treated for COPD exacerbation as well as pneumonia She is on nasal cannula Respiratory status is very tenuous. Due to her overall very poor prognosis family has decided on comfort care measures and palliative care consult was obtained  5.  GI bleed with guaiac positive stool: Hemoglobin remained relatively stable.  No further work-up was performed    DISCHARGE CONDITIONS AND DIET:   Guarded condition to hospice home Diet as tolerated  CONSULTS OBTAINED:  Treatment Team:  Corey Skains, MD  DRUG ALLERGIES:   Allergies   Allergen Reactions  . Indomethacin   . Vicodin [Hydrocodone-Acetaminophen]     DISCHARGE MEDICATIONS:   Allergies as of 01/10/2018      Reactions   Indomethacin    Vicodin [hydrocodone-acetaminophen]       Medication List    STOP taking these medications   ADVAIR DISKUS 250-50 MCG/DOSE Aepb Generic drug:  Fluticasone-Salmeterol   albuterol 108 (90 Base) MCG/ACT inhaler Commonly known as:  PROVENTIL HFA;VENTOLIN HFA   aspirin 81 MG EC tablet   carvedilol 3.125 MG tablet Commonly known as:  COREG   enalapril-hydrochlorothiazide 10-25 MG tablet Commonly known as:  VASERETIC   feeding supplement (ENSURE ENLIVE) Liqd   furosemide 20 MG tablet Commonly known as:  LASIX   multivitamin with minerals Tabs tablet   predniSONE 10 MG tablet Commonly known as:  DELTASONE   tiotropium 18 MCG inhalation capsule Commonly known as:  SPIRIVA   traZODone 50 MG tablet Commonly known as:  DESYREL     TAKE these medications   ALPRAZolam 0.5 MG tablet Commonly known as:  XANAX Take 1 tablet (0.5 mg total) by mouth 4 (four) times daily as needed for anxiety. What changed:    when to take this  Another medication with the same name was removed. Continue taking this medication, and follow the directions you see here.   glycopyrrolate 1 MG tablet Commonly known as:  ROBINUL Take 1 tablet (1 mg total) by mouth every 4 (four) hours as needed (excessive secretions).   morphine 10 MG/5ML solution Take 2.5 mLs (5 mg total) by mouth every 2 (two) hours as needed for moderate  pain or severe pain.         Today   CHIEF COMPLAINT:   Palliative care met with patient's family today and family has decided on hospice home   VITAL SIGNS:  Blood pressure 136/64, pulse (!) 103, temperature (!) 97.4 F (36.3 C), resp. rate 20, height _0  (1.575 m), weight 75.6 kg (166 lb 9.6 oz), SpO2 100 %.   REVIEW OF SYSTEMS:  Review of Systems  Constitutional: Positive for  malaise/fatigue. Negative for chills and fever.  HENT: Negative.  Negative for ear discharge, ear pain, hearing loss, nosebleeds and sore throat.   Eyes: Negative.  Negative for blurred vision and pain.  Respiratory: Positive for cough, shortness of breath and wheezing. Negative for hemoptysis.   Cardiovascular: Negative.  Negative for chest pain, palpitations and leg swelling.  Gastrointestinal: Negative.  Negative for abdominal pain, blood in stool, diarrhea, nausea and vomiting.  Genitourinary: Negative.  Negative for dysuria.  Musculoskeletal: Negative.  Negative for back pain.  Skin: Negative.   Neurological: Negative for dizziness, tremors, speech change, focal weakness, seizures and headaches.  Endo/Heme/Allergies: Negative.  Does not bruise/bleed easily.  Psychiatric/Behavioral: Positive for memory loss. Negative for depression, hallucinations and suicidal ideas.     PHYSICAL EXAMINATION:  GENERAL:  77 y.o.-year-old patient lying in the bed with respiratory distress and critically ill appearing.  NECK:  Supple, no jugular venous distention. No thyroid enlargement, no tenderness.  LUNGS: Lateral rhonchi CARDIOVASCULAR: S1, S2 normal. No murmurs, rubs, or gallops.  ABDOMEN: Soft, non-tender, non-distended. Bowel sounds present. No organomegaly or mass.  EXTREMITIES: No pedal edema, cyanosis, or clubbing.  PSYCHIATRIC: The patient is alert oriented to name and place SKIN: No obvious rash, lesion, or ulcer.   DATA REVIEW:   CBC Recent Labs  Lab 01/06/18 0637  WBC 10.2  HGB 9.6*  HCT 27.8*  PLT 292    Chemistries  Recent Labs  Lab 01/05/18 0511  01/09/18 0432  NA 139   < > 138  K 4.8   < > 4.7  CL 94*   < > 84*  CO2 39*   < > 46*  GLUCOSE 111*   < > 80  BUN 23*   < > 24*  CREATININE 0.54   < > 0.37*  CALCIUM 8.9   < > 8.9  MG 1.9  --   --    < > = values in this interval not displayed.    Cardiac Enzymes No results for input(s): TROPONINI in the last 168  hours.  Microbiology Results  _1 @  RADIOLOGY:  No results found.    Allergies as of 01/10/2018      Reactions   Indomethacin    Vicodin [hydrocodone-acetaminophen]       Medication List    STOP taking these medications   ADVAIR DISKUS 250-50 MCG/DOSE Aepb Generic drug:  Fluticasone-Salmeterol   albuterol 108 (90 Base) MCG/ACT inhaler Commonly known as:  PROVENTIL HFA;VENTOLIN HFA   aspirin 81 MG EC tablet   carvedilol 3.125 MG tablet Commonly known as:  COREG   enalapril-hydrochlorothiazide 10-25 MG tablet Commonly known as:  VASERETIC   feeding supplement (ENSURE ENLIVE) Liqd   furosemide 20 MG tablet Commonly known as:  LASIX   multivitamin with minerals Tabs tablet   predniSONE 10 MG tablet Commonly known as:  DELTASONE   tiotropium 18 MCG inhalation capsule Commonly known as:  SPIRIVA   traZODone 50 MG tablet Commonly known as:  DESYREL  TAKE these medications   ALPRAZolam 0.5 MG tablet Commonly known as:  XANAX Take 1 tablet (0.5 mg total) by mouth 4 (four) times daily as needed for anxiety. What changed:    when to take this  Another medication with the same name was removed. Continue taking this medication, and follow the directions you see here.   glycopyrrolate 1 MG tablet Commonly known as:  ROBINUL Take 1 tablet (1 mg total) by mouth every 4 (four) hours as needed (excessive secretions).   morphine 10 MG/5ML solution Take 2.5 mLs (5 mg total) by mouth every 2 (two) hours as needed for moderate pain or severe pain.          Stable for discharge hospice home  Patient should follow up with hospice  CODE STATUS:     Code Status Orders  (From admission, onward)        Start     Ordered   01/09/18 1115  Do not attempt resuscitation (DNR)  Continuous    Question Answer Comment  In the event of cardiac or respiratory ARREST Do not call a "code blue"   In the event of cardiac or respiratory ARREST Do not perform  Intubation, CPR, defibrillation or ACLS   In the event of cardiac or respiratory ARREST Use medication by any route, position, wound care, and other measures to relive pain and suffering. May use oxygen, suction and manual treatment of airway obstruction as needed for comfort.      01/09/18 1118    Code Status History    Date Active Date Inactive Code Status Order ID Comments User Context   12/30/2017 1628 01/09/2018 1118 DNR [id number]  Jimmy Footman, NP Inpatient   12/28/2017 2223 12/30/2017 1628 Full Code [id number]  Amelia Jo, MD Inpatient   12/18/2017 1106 12/19/2017 2102 Full Code [id number]  Gorden Harms, MD Inpatient   12/15/2017 1141 12/18/2017 1106 DNR [id number]  Hillary Bow, MD Inpatient   12/08/2017 1154 12/15/2017 1141 Full Code [id number]  Gorden Harms, MD Inpatient   06/13/2017 1722 06/17/2017 2152 Full Code [id number]  Demetrios Loll, MD Inpatient    Advance Directive Documentation     Most Recent Value  Type of Advance Directive  Healthcare Power of [place] Ao - Daughter]  Pre-existing out of facility DNR order (yellow form or pink MOST form)  -  "MOST" Form in Place?  -      TOTAL TIME TAKING CARE OF THIS PATIENT: 38 minutes.    Note: This dictation was prepared with Dragon dictation along with smaller phrase technology. Any transcriptional errors that result from this process are unintentional.  Randye Treichler M.D on 01/10/2018 at 9:50 AM  Between 7am to 6pm - Pager - [phone] After 6pm go to [email or web address] - password [email or web address] [facility]  Office  [phone]  CC: Primary care physician; Glendon Axe, MD

## 2018-01-09 NOTE — Progress Notes (Signed)
Pharmacy Electrolyte Monitoring Consult:  Pharmacy consulted to assist in monitoring and replacing electrolytes in this 77 y.o. female admitted on 12/28/2017 with hyponatremia secondary to over diuresis.    Labs:  Sodium (mmol/L)  Date Value  01/09/2018 138   Potassium (mmol/L)  Date Value  01/09/2018 4.7   Magnesium (mg/dL)  Date Value  16/10/960405/30/2019 1.9   Phosphorus (mg/dL)  Date Value  54/09/811905/30/2019 3.6   Calcium (mg/dL)  Date Value  14/78/295606/10/2017 8.9   Albumin (g/dL)  Date Value  21/30/865705/22/2019 4.1    Assessment/Plan:  No supplementation needed at this time.  Patient made comfort care per family request per RN note.  Pharmacy will continue to monitor and adjust per consult.  Florina Glas A,  01/09/2018 7:17 AM

## 2018-01-09 NOTE — Care Management (Signed)
Made comfort care 6/2.

## 2018-01-09 NOTE — Progress Notes (Signed)
Daily Progress Note   Patient Name: Candace Butler       Date: 01/09/2018 DOB: 1941/07/03  Age: 77 y.o. MRN#: [id number] Attending Physician: Adrian Saran, MD Primary Care Physician: Leotis Shames, MD Admit Date: 12/28/2017  Reason for Consultation/Follow-up: Establishing goals of care and Psychosocial/spiritual support  Subjective: Patient is lying in bed, awake, alert and oriented x3. She is somewhat lethargic. Easily aroused. Denies pain at this time, but complains of "feeling as though she can't breath". Some anxiety noted. She has a fan at bedside for comfort. Daughter is at bedside and states yesterday family decided they would not like to continue with aggressive care measures and shift all care to full comfort measures. Patient verbalized her agreement and states "just keep me comfortable and take me to hospice so I can go on." Daughter verbalized if mom would improve, because today she seemed more alert and engaged. We discussed her overall health conditions and trajectory. Also how some patients do indeed "rallie" or appear to be in a somewhat better health state prior to passing away or exhibiting signs of decline again. Daughter verbalized understanding and states she has observed this when caring for patients in her career, it just seems harder being on the other side and seeing it. Support was giving to the daughter. Offered chaplain services, however she declined.   I spoke with the daughter and discussed comfort care and what this looks like. Daughter/patient verbalized that comfort measures focus on symptoms and keeping patient as comfortable as possible during her transition. This would include aggressively managing symptoms such as shortness of breath, pain, discomfort, agitation/anxiety  and nausea etc. Family is aware that no aggressive measures will take place such as IVF, lab test, radiology testing. Again, daughter verbalized understanding and states the goal is for her to be kept comfortable until she dies.   Chart reviewed and report received from bedside RN.   Length of Stay: 12  Current Medications: Scheduled Meds:  . budesonide (PULMICORT) nebulizer solution  0.5 mg Nebulization BID  . docusate sodium  100 mg Oral BID  . ipratropium-albuterol  3 mL Nebulization Q6H  . sodium chloride flush  3 mL Intravenous Q12H    Continuous Infusions:   PRN Meds: acetaminophen **OR** acetaminophen, albuterol, ALPRAZolam, antiseptic oral rinse, bisacodyl, feeding supplement (ENSURE ENLIVE), glycopyrrolate **OR**  glycopyrrolate, morphine injection, ondansetron **OR** ondansetron (ZOFRAN) IV, polyvinyl alcohol, traZODone  Physical Exam  Constitutional: She appears well-developed. She has a sickly appearance.  Cardiovascular: Regular rhythm, normal heart sounds, intact distal pulses and normal pulses. Tachycardia present.  Pulmonary/Chest: Accessory muscle usage present. She has decreased breath sounds. She has wheezes.  Shortness of breath, cough   Abdominal: Soft. Bowel sounds are normal.  Musculoskeletal:  Generalized weakness   Neurological: She is alert.  Psychiatric: She has a normal mood and affect. Her speech is normal and behavior is normal. Judgment and thought content normal. Cognition and memory are normal.  Nursing note and vitals reviewed.           Vital Signs: BP (!) 116/52 (BP Location: Right Arm)   Pulse (!) 105   Temp 98.5 F (36.9 C) (Oral)   Resp 18   Ht 5\' 2"  (1.575 m)   Wt 75.6 kg (166 lb 9.6 oz)   SpO2 99%   BMI 30.47 kg/m  SpO2: SpO2: 99 % O2 Device: O2 Device: Nasal Cannula O2 Flow Rate: O2 Flow Rate (L/min): 5 L/min  Intake/output summary:   Intake/Output Summary (Last 24 hours) at 01/09/2018 1124 Last data filed at 01/09/2018  0400 Gross per 24 hour  Intake 360 ml  Output 1150 ml  Net -790 ml   LBM: Last BM Date: 01/06/18 Baseline Weight: Weight: 74.8 kg (165 lb) Most recent weight: Weight: 75.6 kg (166 lb 9.6 oz)       Palliative Assessment/Data:PPS30%   Flowsheet Rows     Most Recent Value  Intake Tab  Referral Department  Hospitalist  Unit at Time of Referral  ICU  Palliative Care Primary Diagnosis  Pulmonary  Date Notified  12/29/17  Palliative Care Type  New Palliative care  Reason for referral  Clarify Goals of Care  Date of Admission  12/28/17  Date first seen by Palliative Care  12/30/17  # of days Palliative referral response time  1 Day(s)  # of days IP prior to Palliative referral  1  Clinical Assessment  Psychosocial & Spiritual Assessment  Palliative Care Outcomes      Patient Active Problem List   Diagnosis Date Noted  . Melena   . Acute blood loss anemia   . Acute hyponatremia 12/28/2017  . Respiratory failure (HCC) 12/08/2017  . COPD exacerbation ([facility]CC) 06/13/2017    Palliative Care Assessment & Plan   Patient Profile: 77 y.o. female admitted on 12/28/2017 from [facility] with increased confusion and also after falling out of her wheelchair and obtaining a left shin laceration and abrasion to her left knee. She has a past medical history significant for hypertension, COPD, chronic home O2 @2 -4L/[place], and asthma.She was discharged from [facility] on 12/19/17 and instructed to continue enalapril-hydrochlorothiazide and started on 60 mg furosemide daily.  According to reports the patient did not lose consciousness during fall. During her ED course blood test were remarkable for low sodium level at 102,low chloride level,below 65 and low potassium level at 3.2.A recent sodium level from 10 days ago was 138. Chest x-ray and brain CT were without any acute changes. Palliative Medicine team consulted for goals of care discussion.   Recommendations/Plan:  DNR/DNI  FULL COMFORT  MEASURES   CSW consult for residential hospice placement. Family/patient has decided to shift care to full comfort.  Xanax 0.5 mg PO 4 times a day PRN for agitation/anxiety-patient continues to have quite a bit of anxiety related to feelings of not getting  enough oxygen, unable to take deep breaths.   Robinul PRN for excessive secretions   Morphine 2mg  IV every 2 hours PRN for pain, shortness of breath/air hunger-patient is having some generalized pain and shortness of breath  Zofran PRN for nausea/vomiting  Palliative Medicine team will continue support patient, family, and medical team during hospitalization.   Goals of Care and Additional Recommendations:  Limitations on Scope of Treatment: Full Comfort Care  Code Status:    Code Status Orders  (From admission, onward)        Start     Ordered   01/09/18 1115  Do not attempt resuscitation (DNR)  Continuous    Question Answer Comment  In the event of cardiac or respiratory ARREST Do not call a "code blue"   In the event of cardiac or respiratory ARREST Do not perform Intubation, CPR, defibrillation or ACLS   In the event of cardiac or respiratory ARREST Use medication by any route, position, wound care, and other measures to relive pain and suffering. May use oxygen, suction and manual treatment of airway obstruction as needed for comfort.      01/09/18 1118    Code Status History    Date Active Date Inactive Code Status Order ID Comments User Context   12/30/2017 1628 01/09/2018 1118 DNR [id number]  Glee ArvinPickenpack-Cousar, Deunta Beneke N, NP Inpatient   12/28/2017 2223 12/30/2017 1628 Full Code [id number]  Cammy CopaMaier, Angela, MD Inpatient   12/18/2017 1106 12/19/2017 2102 Full Code [id number]  Bertrum SolSalary, Montell D, MD Inpatient   12/15/2017 1141 12/18/2017 1106 DNR [id number]  Milagros LollSudini, Srikar, MD Inpatient   12/08/2017 1154 12/15/2017 1141 Full Code [id number]  Bertrum SolSalary, Montell D, MD Inpatient   06/13/2017 1722 06/17/2017 2152 Full Code [id number]  Shaune Pollackhen, Qing, MD  Inpatient    Advance Directive Documentation     Most Recent Value  Type of Advance Directive  Healthcare Power of Laurice RecordAttorney [[place] - Daughter]  Pre-existing out of facility DNR order (yellow form or pink MOST form)  -  "MOST" Form in Place?  -      Prognosis:   Poor in the setting of full comfort measures, acute on chronic respiratory failure with hypercapnia, shortness of breath, COPD, decreased po intake, immobility, deconditioning , and GI bleed without intervention.   Discharge Planning:  Hospice facility  Care plan was discussed with patient, patient's daughter, and bedside RN.   Thank you for allowing the Palliative Medicine Team to assist in the care of this patient.   Total Time 45 min.  Prolonged Time Billed  NO      Greater than 50%  of this time was spent counseling and coordinating care related to the above assessment and plan.  Willette AlmaNikki Pickenpack-Cousar, NP-BC Palliative Medicine Team  Phone: [phone] Fax: [phone]  Please contact Palliative Medicine Team phone at [phone] for questions and concerns.

## 2018-01-10 NOTE — Plan of Care (Signed)
  Problem: Pain Managment: Goal: General experience of comfort will improve Outcome: Progressing  Patient is comfort care/

## 2018-01-10 NOTE — Progress Notes (Signed)
Daily Progress Note   Patient Name: Candace Butler       Date: 01/10/2018 DOB: 1941-07-03  Age: 77 y.o. MRN#: [id number] Attending Physician: Bettey Costa, MD Primary Care Physician: Glendon Axe, MD Admit Date: 12/28/2017  Reason for Consultation/Follow-up: Establishing goals of care and Psychosocial/spiritual support  Subjective: Patient is lying in bed, appears somewhat more lethargic today. She answers with a moan to her name but will not open eyes or engage in conversation. No family at the bedside. Spoke with daughter who verbalized the plan to discharge to residential hospice on today. Patient appears comfortable and in no acute distress.   Chart reviewed and report received from bedside RN.   Length of Stay: 13  Current Medications: Scheduled Meds:  . budesonide (PULMICORT) nebulizer solution  0.5 mg Nebulization BID  . docusate sodium  100 mg Oral BID  . ipratropium-albuterol  3 mL Nebulization Q6H  . sodium chloride flush  3 mL Intravenous Q12H    Continuous Infusions:   PRN Meds: acetaminophen **OR** acetaminophen, albuterol, ALPRAZolam, antiseptic oral rinse, bisacodyl, feeding supplement (ENSURE ENLIVE), glycopyrrolate **OR** glycopyrrolate, morphine injection, ondansetron **OR** ondansetron (ZOFRAN) IV, polyvinyl alcohol, traZODone  Physical Exam  Constitutional: She appears well-developed. She appears lethargic. She has a sickly appearance.  Cardiovascular: Regular rhythm, normal heart sounds, intact distal pulses and normal pulses. Tachycardia present.  Pulmonary/Chest: Accessory muscle usage present. She has decreased breath sounds. She has wheezes.  Shortness of breath, cough   Abdominal: Soft. Bowel sounds are normal.  Musculoskeletal:  Generalized weakness     Neurological: She appears lethargic.  Psychiatric: She has a normal mood and affect. Her speech is normal and behavior is normal. Judgment and thought content normal. Cognition and memory are normal.  Nursing note and vitals reviewed.           Vital Signs: BP 136/64 (BP Location: Right Arm)   Pulse (!) 103   Temp (!) 97.4 F (36.3 C)   Resp 20   Ht _0  (1.575 m)   Wt 75.6 kg (166 lb 9.6 oz)   SpO2 100%   BMI 30.47 kg/m  SpO2: SpO2: 100 % O2 Device: O2 Device: Nasal Cannula O2 Flow Rate: O2 Flow Rate (L/min): 5 L/min  Intake/output summary:   Intake/Output Summary (Last 24 hours) at 01/10/2018 1104 Last data  filed at 01/10/2018 1037 Gross per 24 hour  Intake 480 ml  Output 4000 ml  Net -3520 ml   LBM: Last BM Date: 01/08/18 Baseline Weight: Weight: 74.8 kg (165 lb) Most recent weight: Weight: 75.6 kg (166 lb 9.6 oz)       Palliative Assessment/Data:PPS 20%   Flowsheet Rows     Most Recent Value  Intake Tab  Referral Department  Hospitalist  Unit at Time of Referral  ICU  Palliative Care Primary Diagnosis  Pulmonary  Date Notified  12/29/17  Palliative Care Type  New Palliative care  Reason for referral  Clarify Goals of Care  Date of Admission  12/28/17  Date first seen by Palliative Care  12/30/17  # of days Palliative referral response time  1 Day(s)  # of days IP prior to Palliative referral  1  Clinical Assessment  Psychosocial & Spiritual Assessment  Palliative Care Outcomes      Patient Active Problem List   Diagnosis Date Noted  . Melena   . Acute blood loss anemia   . Acute hyponatremia 12/28/2017  . Respiratory failure ([place]) 12/08/2017  . COPD exacerbation ([place]) 06/13/2017    Palliative Care Assessment & Plan   Patient Profile: 77 y.o. female admitted on 12/28/2017 from [facility] with increased confusion and also after falling out of her wheelchair and obtaining a left shin laceration and abrasion to her left knee. She has a past medical  history significant for hypertension, COPD, chronic home O2 _0 -4L/[place], and asthma.She was discharged from [facility] on 12/19/17 and instructed to continue enalapril-hydrochlorothiazide and started on 60 mg furosemide daily.  According to reports the patient did not lose consciousness during fall. During her ED course blood test were remarkable for low sodium level at 102,low chloride level,below 65 and low potassium level at 3.2.A recent sodium level from 10 days ago was 138. Chest x-ray and brain CT were without any acute changes. Palliative Medicine team consulted for goals of care discussion.   Recommendations/Plan:  DNR/DNI  FULL COMFORT MEASURES   Santiago Glad, RN [facility] has met with daughter in preparation for transfer to residential hospice. Daughter verbalized understanding of plan.   Xanax 0.5 mg PO 4 times a day PRN for agitation/anxiety-patient continues to have quite a bit of anxiety related to feelings of not getting enough oxygen, unable to take deep breaths.   Robinul PRN for excessive secretions   Morphine 56m IV every 2 hours PRN for pain, shortness of breath/air hunger-patient is having some generalized pain and shortness of breath  Zofran PRN for nausea/vomiting  Palliative Medicine team will continue support patient, family, and medical team during hospitalization.   Goals of Care and Additional Recommendations:  Limitations on Scope of Treatment: Full Comfort Care  Code Status:    Code Status Orders  (From admission, onward)        Start     Ordered   01/09/18 1115  Do not attempt resuscitation (DNR)  Continuous    Question Answer Comment  In the event of cardiac or respiratory ARREST Do not call a "code blue"   In the event of cardiac or respiratory ARREST Do not perform Intubation, CPR, defibrillation or ACLS   In the event of cardiac or respiratory ARREST Use medication by any route, position, wound care, and other measures to relive pain and  suffering. May use oxygen, suction and manual treatment of airway obstruction as needed for comfort.      01/09/18 1118  Code Status History    Date Active Date Inactive Code Status Order ID Comments User Context   12/30/2017 1628 01/09/2018 1118 DNR [id number]  Jimmy Footman, NP Inpatient   12/28/2017 2223 12/30/2017 1628 Full Code [id number]  Amelia Jo, MD Inpatient   12/18/2017 1106 12/19/2017 2102 Full Code [id number]  Gorden Harms, MD Inpatient   12/15/2017 1141 12/18/2017 1106 DNR [id number]  Hillary Bow, MD Inpatient   12/08/2017 1154 12/15/2017 1141 Full Code [id number]  Gorden Harms, MD Inpatient   06/13/2017 1722 06/17/2017 2152 Full Code [id number]  Demetrios Loll, MD Inpatient    Advance Directive Documentation     Most Recent Value  Type of Advance Directive  Healthcare Power of [place] Ao - Daughter]  Pre-existing out of facility DNR order (yellow form or pink MOST form)  -  "MOST" Form in Place?  -      Prognosis:   Poor in the setting of full comfort measures, acute on chronic respiratory failure with hypercapnia, shortness of breath, COPD, decreased po intake, immobility, deconditioning , and GI bleed without intervention.   Discharge Planning:  Hospice facility  Care plan was discussed with patient, patient's daughter, and bedside RN.   Thank you for allowing the Palliative Medicine Team to assist in the care of this patient.   Total Time 25 min.  Prolonged Time Billed  NO      Greater than 50%  of this time was spent counseling and coordinating care related to the above assessment and plan.  Alda Lea, NP-BC Palliative Medicine Team  Phone: [phone] Fax: [phone]  Please contact Palliative Medicine Team phone at [phone] for questions and concerns.

## 2018-01-10 NOTE — Progress Notes (Signed)
Follow up visit made to new hospice home referral. Patient seen lying in bed, eyes closed, appeared to be sleeping, answered to her name, but did not open her eyes. Per chart review PRn Xanax 0.5 mg was given at aprox 8 am for anxiety. Patient's daughter Eunice BlaseDebbie at bedside as planned. Shew as very emotional and tearful, support given. Writer reviewed hospice services, philosophy and team approach to care with understanding voiced. She remains in agreement for her mother to transfer to the hospice home this morning.  Questions answered, consents signed. Updated notes/discahrge summary faxed to referral. Report called to the hospice home. EMS notified for transport. Hospital care team all updated and aware. Signed DNR and discharge summary in place in discharge packet. Thank you. Dayna BarkerKaren Robertson RN, BSN, [facility], hospital liaison [phone]

## 2018-01-10 NOTE — Care Management Important Message (Signed)
Copy of signed IM left in patient's room.    

## 2018-01-10 NOTE — Clinical Social Work Note (Signed)
CSW was informed that patient can discharge to hospice home today.  [facility]  Patient and family agreeable to plans will transport via ems hospice liaison RN to call report.  Windell MouldingEric Giovanie Lefebre, MSW, Theresia MajorsLCSWA [phone]

## 2018-01-10 NOTE — Progress Notes (Signed)
Patient going to hospice home.  IV in her arm and catheter still in place as well.  D/c via ems.  On 4LNC chronic.  No pain.

## 2018-02-06 DEATH — deceased

## 2019-03-05 IMAGING — DX DG CHEST 1V PORT
1 series · 1 of 1 positions shown · non-contrast
Comparison: Chest x-ray from yesterday.

CLINICAL DATA: Pneumonia.

EXAM:
PORTABLE CHEST 1 VIEW

[chest ap]
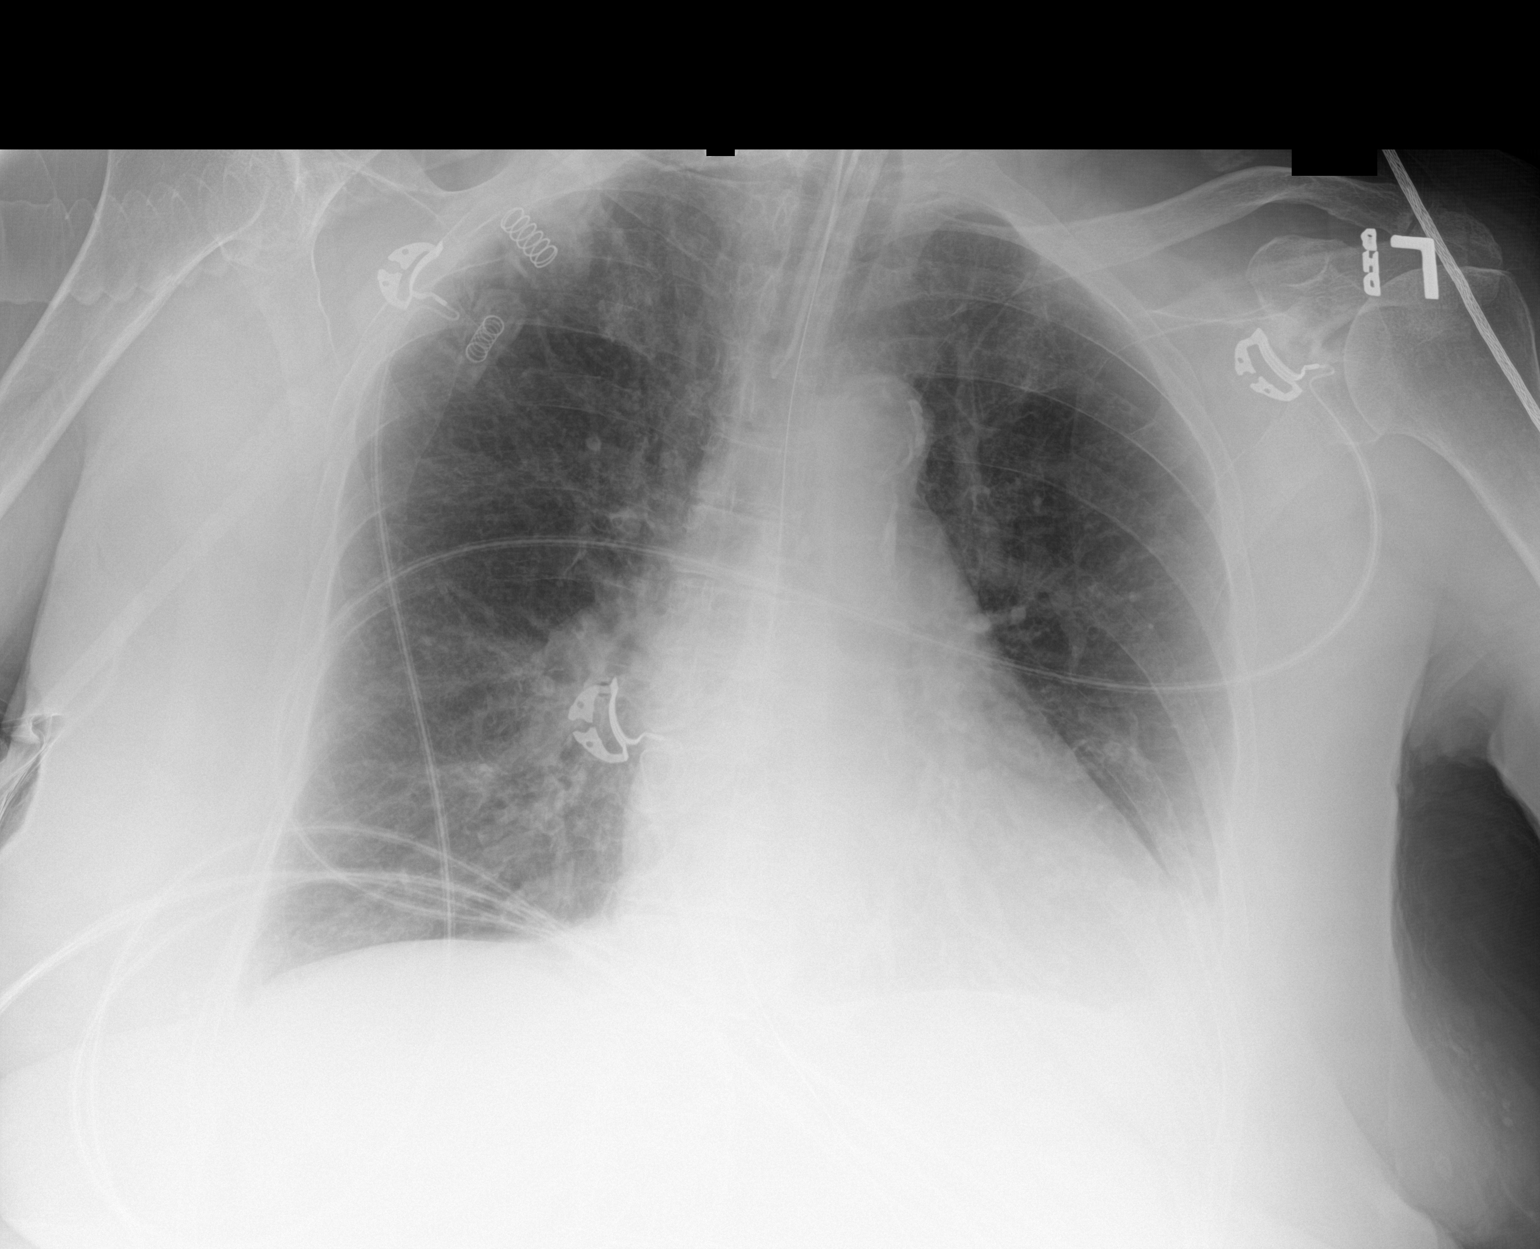

[1 of 1 positions shown; findings below may reference images not displayed]

FINDINGS: Endotracheal tube remains in good position. Unchanged enteric tube
entering the stomach with the tip below the field of view. Stable
cardiomediastinal silhouette. Coarsened interstitial markings are
similar to prior study. Mild atelectasis at the left lung base. No
focal consolidation, pleural effusion, or pneumothorax. No acute
osseous abnormality.
IMPRESSION: No active disease.

## 2019-03-08 IMAGING — DX DG CHEST 1V PORT
1 series · 1 of 1 positions shown · non-contrast
Comparison: Chest radiograph 12/10/2017

CLINICAL DATA: Respiratory failure

EXAM:
PORTABLE CHEST 1 VIEW

[chest ap]
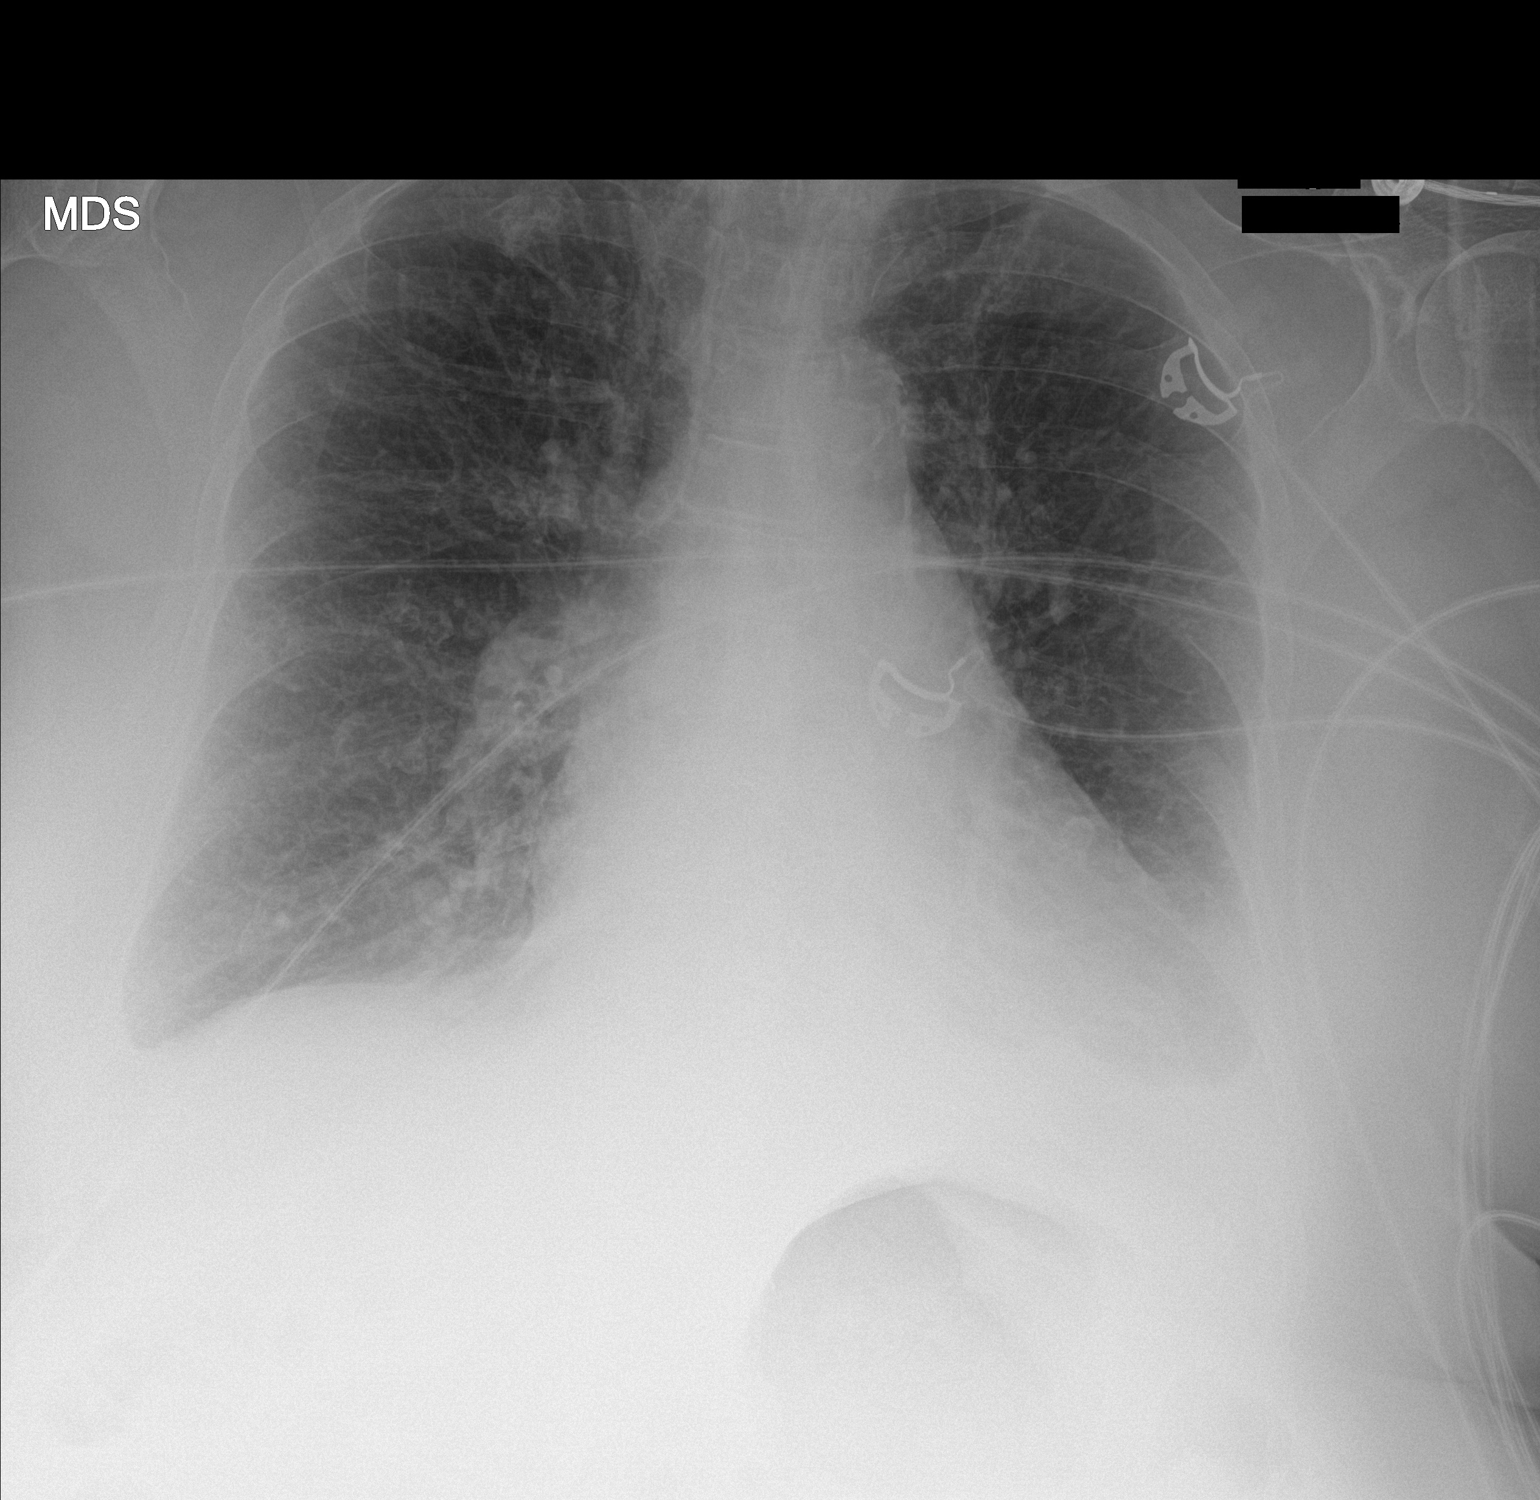

[1 of 1 positions shown; findings below may reference images not displayed]

FINDINGS: Cardiomegaly with mild pulmonary edema. Small left pleural effusion.
Unchanged cardiomegaly. Endotracheal tube has been removed.
IMPRESSION: Mild cardiomegaly and mild pulmonary edema.

## 2019-03-23 IMAGING — CT CT HEAD W/O CM
4 series · 16 of 47 positions shown, 18 images · non-contrast
Comparison: None.

CLINICAL DATA: Fall with laceration

EXAM:
CT HEAD WITHOUT CONTRAST
TECHNIQUE: Contiguous axial images were obtained from the base of the skull
through the vertex without intravenous contrast.

[Series 2: head bone · axial · 0.41mm/px · z∈[+112,+142]mm · 3 of 75 slices shown]
[im 8/75  bone]
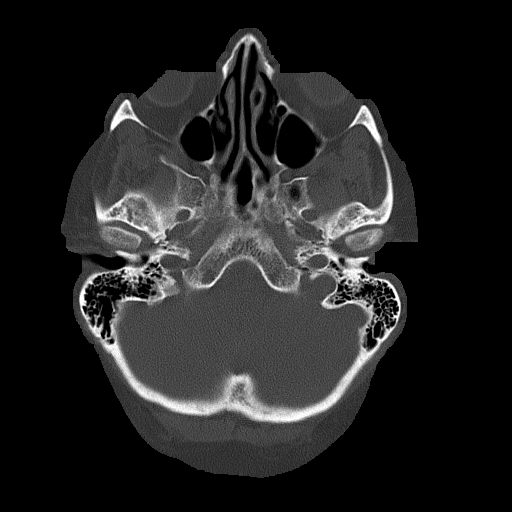
[im 15/75  bone]
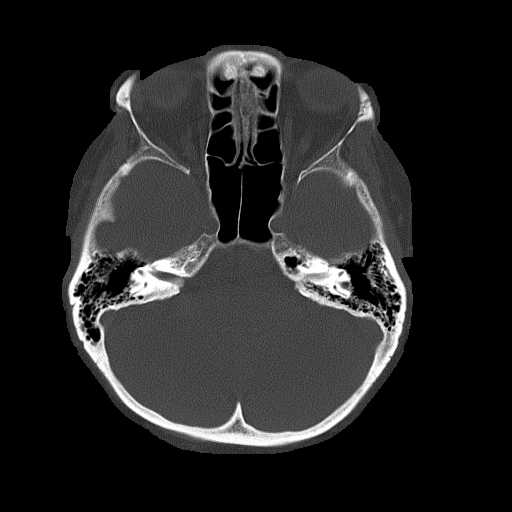
[im 23/75  bone]
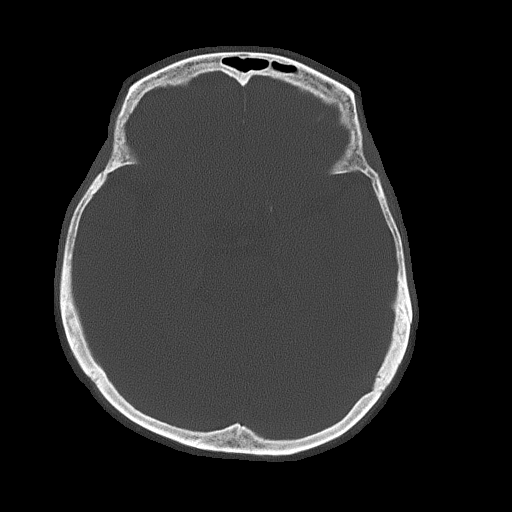

[Series 3: head wo · axial · 0.41mm/px · z∈[+113,+223]mm · 7 of 30 slices shown, 9 images]
[im 4/30  brain]
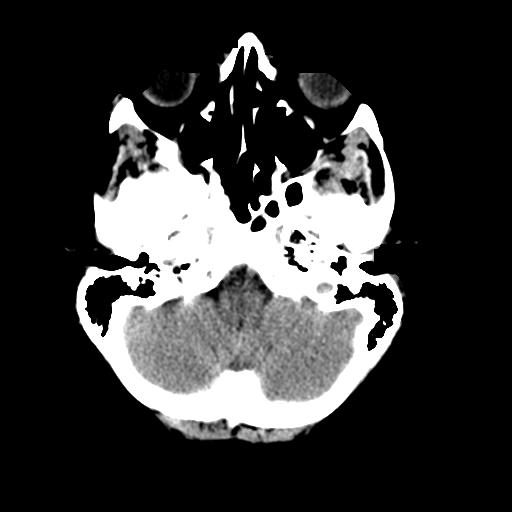
[im 4/30  bone]
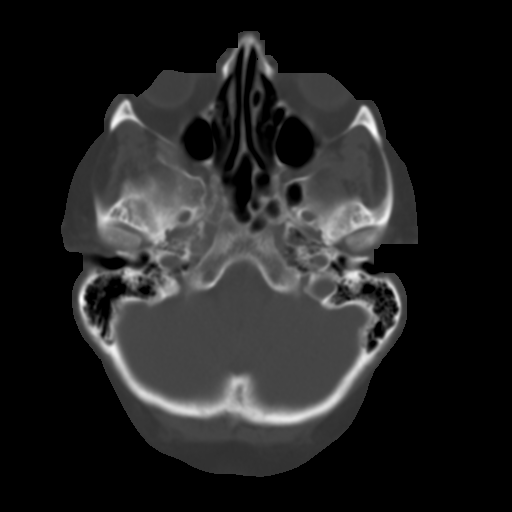
[im 8/30  brain]
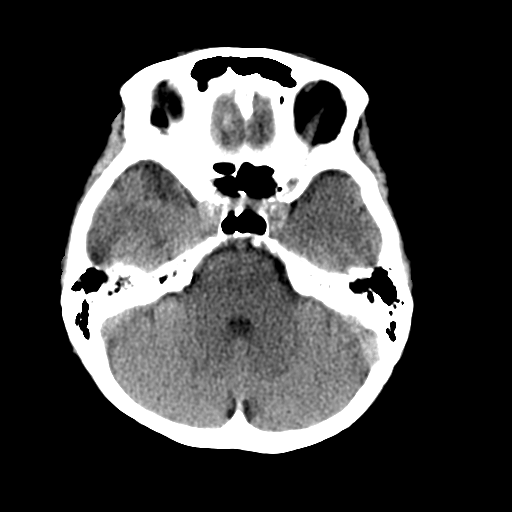
[im 11/30  brain]
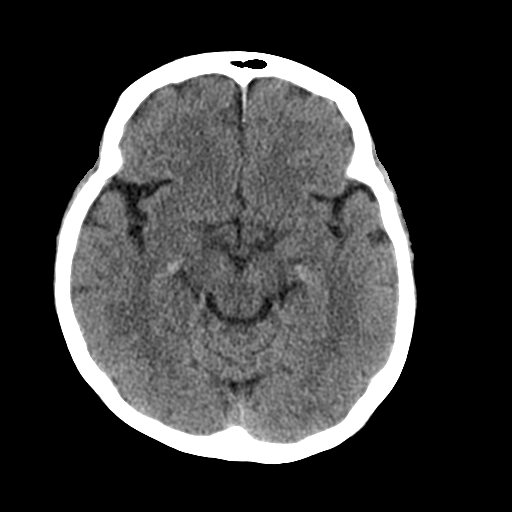
[im 15/30  brain]
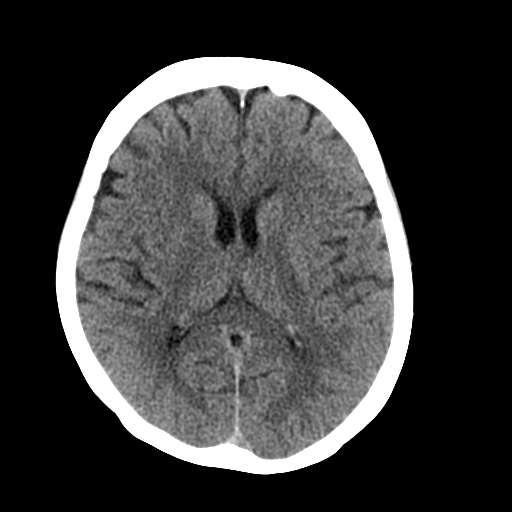
[im 19/30  brain]
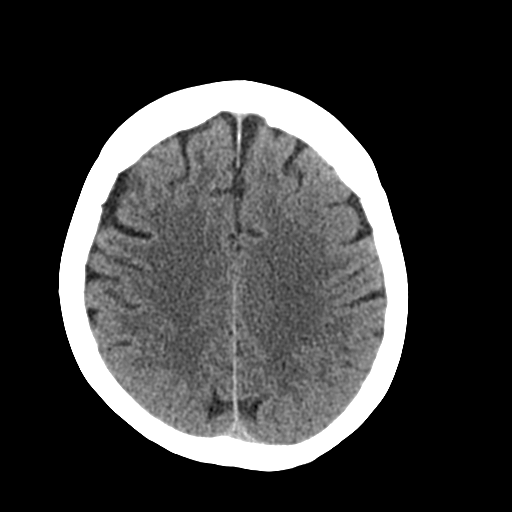
[im 19/30  bone]
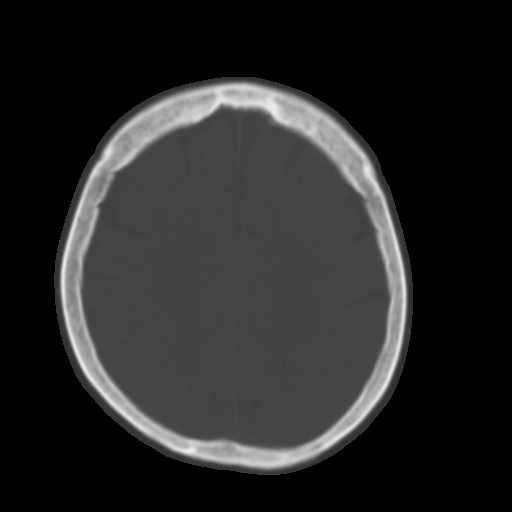
[im 22/30  brain]
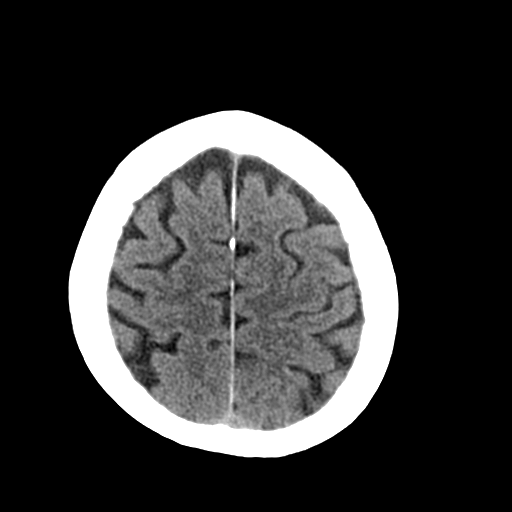
[im 26/30  brain]
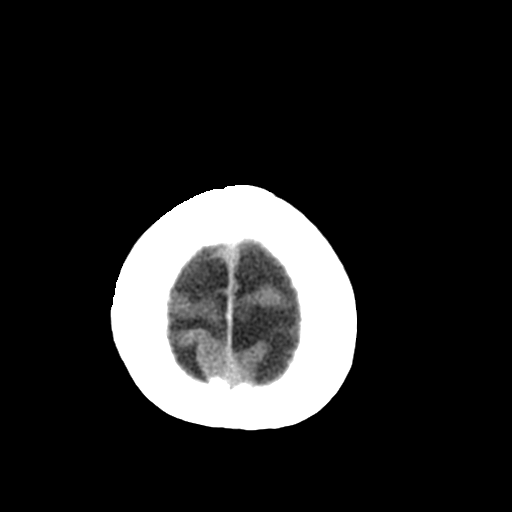

[Series 4: coronal soft tissue · coronal · 0.30mm/px · 3 of 61 slices shown]
[im 21/61  brain]
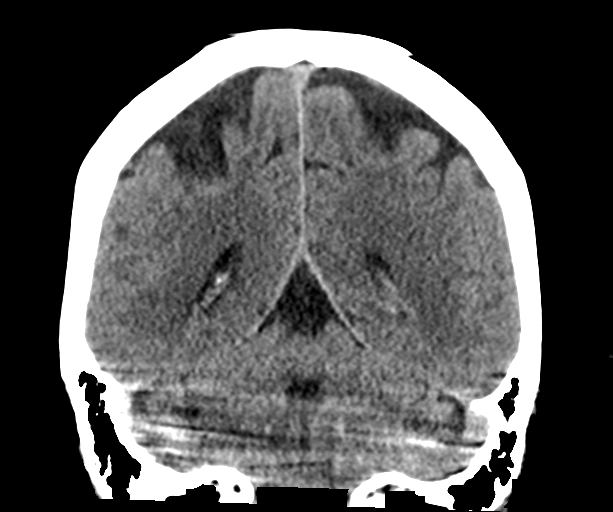
[im 27/61  brain]
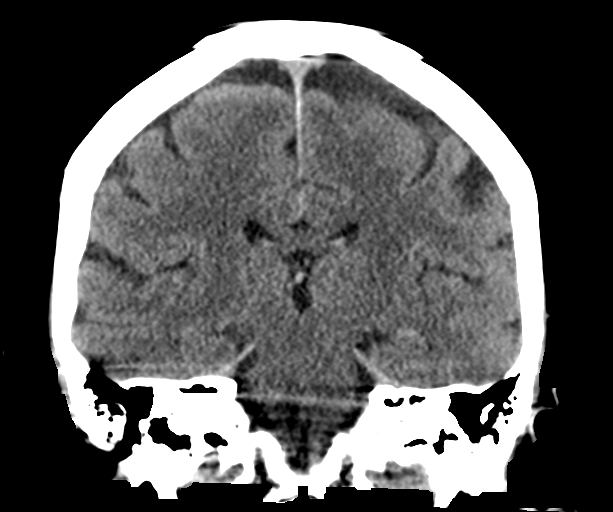
[im 34/61  brain]
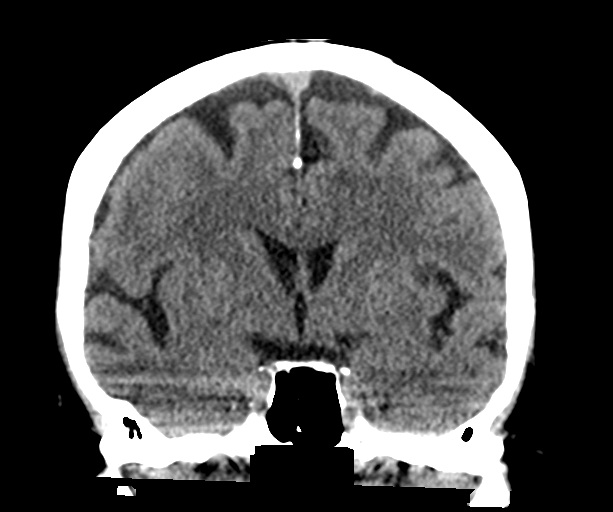

[Series 5: sagittal soft tissue · sagittal · 0.30mm/px · 3 of 50 slices shown]
[im 17/50  brain]
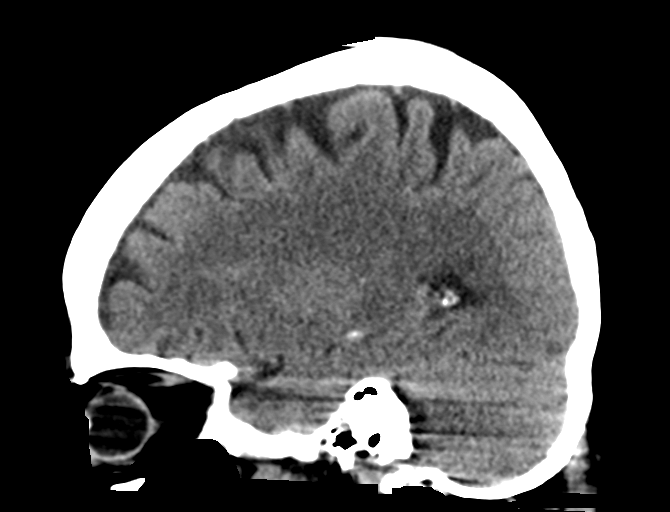
[im 25/50  brain]
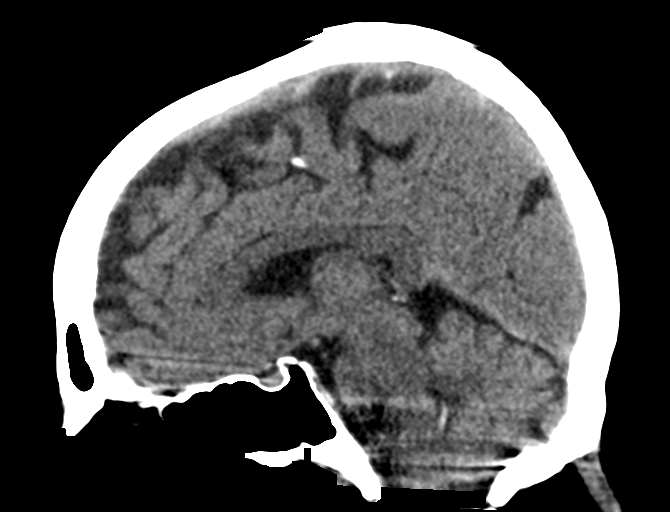
[im 33/50  brain]
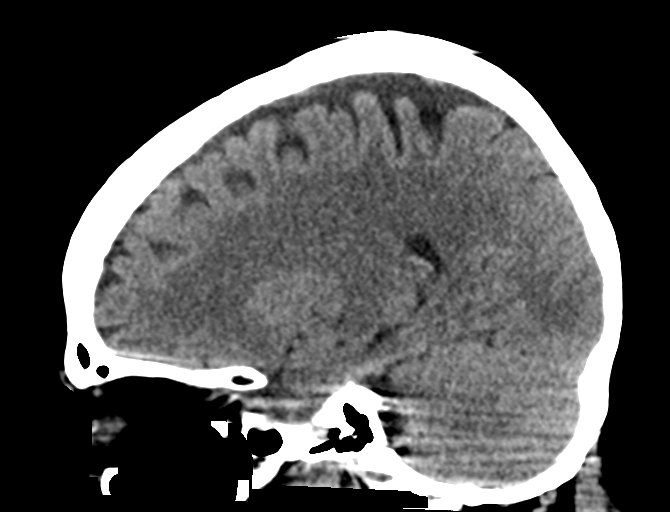

[16 of 47 positions shown; findings below may reference images not displayed]

FINDINGS: Brain: No evidence of acute infarction, hemorrhage, hydrocephalus,
extra-axial collection or mass lesion/mass effect. Mild atrophy

Vascular: No hyperdense vessel.  Carotid vascular calcification

Skull: Normal. Negative for fracture or focal lesion.

Sinuses/Orbits: No acute finding.

Other: None
IMPRESSION: Negative non contrasted CT appearance of the brain for age

## 2019-03-29 IMAGING — DX DG CHEST 1V PORT
1 series · 1 of 1 positions shown · non-contrast
Comparison: 12/28/2017.

CLINICAL DATA: Atelectasis.

EXAM:
PORTABLE CHEST 1 VIEW

[chest ap]
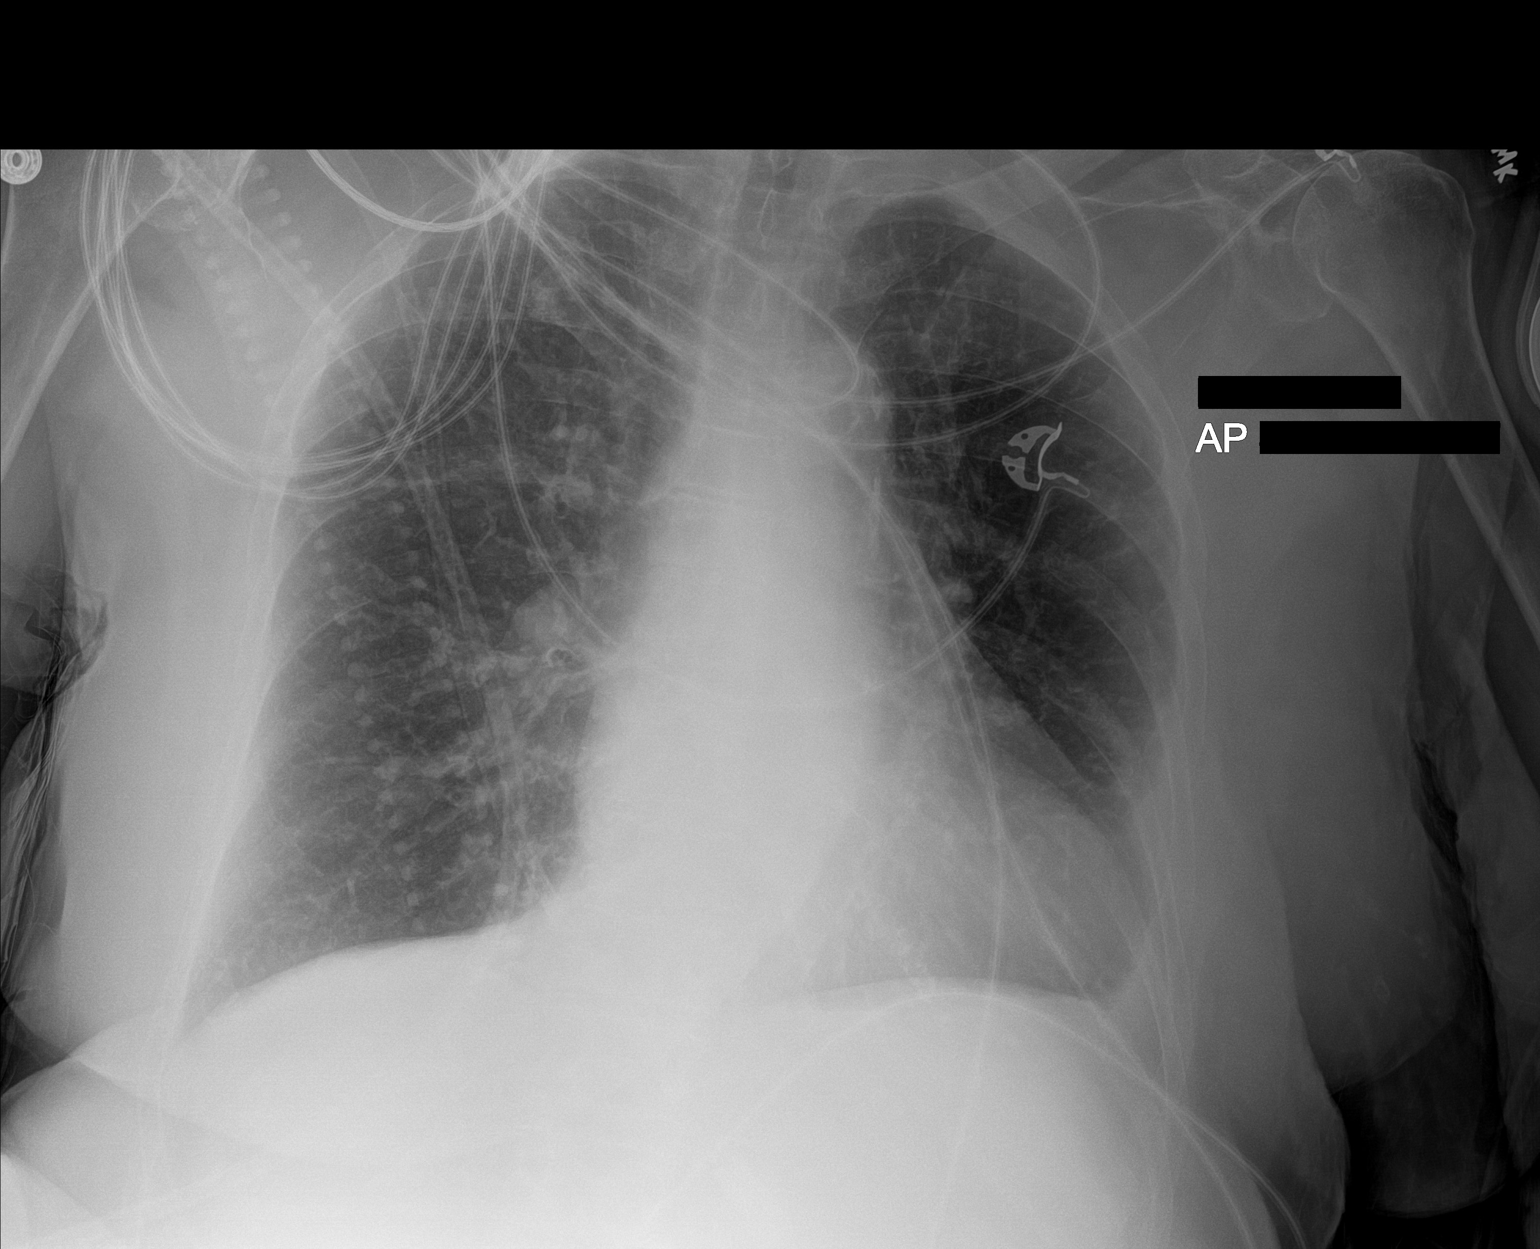

[1 of 1 positions shown; findings below may reference images not displayed]

FINDINGS: Mediastinum and hilar structures normal. Cardiomegaly with normal
pulmonary vascularity. Mild left base subsegmental atelectasis.
Small left pleural effusion. No pneumothorax.
IMPRESSION: Mild left base subsegmental atelectasis. Small left pleural
effusion.
